# Patient Record
Sex: Female | Born: 1990
Health system: Southern US, Community
[De-identification: ages and names within clinical notes are randomized; demographics above are authoritative.]

## PROBLEM LIST (undated history)

## (undated) DIAGNOSIS — J45909 Unspecified asthma, uncomplicated: Secondary | ICD-10-CM

## (undated) DIAGNOSIS — I10 Essential (primary) hypertension: Secondary | ICD-10-CM

## (undated) DIAGNOSIS — O24419 Gestational diabetes mellitus in pregnancy, unspecified control: Secondary | ICD-10-CM

## (undated) HISTORY — PX: CHOLECYSTECTOMY: SHX55

---

## 2010-01-04 ENCOUNTER — Emergency Department (HOSPITAL_COMMUNITY): Admission: EM | Admit: 2010-01-04 | Discharge: 2010-01-04 | Payer: Self-pay | Admitting: Emergency Medicine

## 2010-02-14 ENCOUNTER — Inpatient Hospital Stay (HOSPITAL_COMMUNITY): Admission: AD | Admit: 2010-02-14 | Discharge: 2010-02-14 | Payer: Self-pay | Admitting: Obstetrics and Gynecology

## 2010-02-14 ENCOUNTER — Ambulatory Visit: Payer: Self-pay | Admitting: Advanced Practice Midwife

## 2010-03-02 ENCOUNTER — Ambulatory Visit: Payer: Self-pay | Admitting: Physician Assistant

## 2010-03-02 ENCOUNTER — Inpatient Hospital Stay (HOSPITAL_COMMUNITY): Admission: AD | Admit: 2010-03-02 | Discharge: 2010-03-02 | Payer: Self-pay | Admitting: Obstetrics & Gynecology

## 2010-03-08 ENCOUNTER — Inpatient Hospital Stay (HOSPITAL_COMMUNITY): Admission: AD | Admit: 2010-03-08 | Discharge: 2010-03-08 | Payer: Self-pay | Admitting: Family Medicine

## 2011-03-08 LAB — COMPREHENSIVE METABOLIC PANEL
ALT: 13 U/L (ref 0–35)
Alkaline Phosphatase: 31 U/L — ABNORMAL LOW (ref 39–117)
CO2: 25 mEq/L (ref 19–32)
Calcium: 8.6 mg/dL (ref 8.4–10.5)
Chloride: 103 mEq/L (ref 96–112)
GFR calc non Af Amer: >60 mL/min (ref >60)
Glucose, Bld: 83 mg/dL (ref 70–99)
Sodium: 136 mEq/L (ref 135–145)
Total Bilirubin: 0.4 mg/dL (ref 0.3–1.2)

## 2011-03-08 LAB — DIFFERENTIAL
Basophils Absolute: 0 10*3/uL (ref 0.0–0.1)
Basophils Relative: 0 % (ref 0–1)
Eosinophils Absolute: 0.1 10*3/uL (ref 0.0–0.7)
Lymphs Abs: 0.8 10*3/uL (ref 0.7–4.0)
Neutrophils Relative %: 72 % (ref 43–77)

## 2011-03-08 LAB — URINALYSIS, ROUTINE W REFLEX MICROSCOPIC
Ketones, ur: 15 mg/dL — AB
Nitrite: NEGATIVE
Protein, ur: NEGATIVE mg/dL
Urobilinogen, UA: 1 mg/dL (ref 0.0–1.0)

## 2011-03-08 LAB — URINE MICROSCOPIC-ADD ON

## 2011-03-08 LAB — CBC
Hemoglobin: 9.6 g/dL — ABNORMAL LOW (ref 12.0–15.0)
MCHC: 34.4 g/dL (ref 30.0–36.0)
RBC: 3.16 MIL/uL — ABNORMAL LOW (ref 3.87–5.11)

## 2011-03-11 LAB — URINE MICROSCOPIC-ADD ON

## 2011-03-11 LAB — URINALYSIS, ROUTINE W REFLEX MICROSCOPIC
Bilirubin Urine: NEGATIVE
Hgb urine dipstick: NEGATIVE
Specific Gravity, Urine: 1.01 (ref 1.005–1.030)
pH: 8 (ref 5.0–8.0)

## 2011-03-11 LAB — WET PREP, GENITAL: Trich, Wet Prep: NONE SEEN

## 2011-03-11 LAB — GC/CHLAMYDIA PROBE AMP, GENITAL
Chlamydia, DNA Probe: POSITIVE — AB
GC Probe Amp, Genital: POSITIVE — AB

## 2011-03-15 LAB — URINE MICROSCOPIC-ADD ON

## 2011-03-15 LAB — URINALYSIS, ROUTINE W REFLEX MICROSCOPIC
Bilirubin Urine: NEGATIVE
Hgb urine dipstick: NEGATIVE
Nitrite: NEGATIVE
Specific Gravity, Urine: 1.01 (ref 1.005–1.030)
Urobilinogen, UA: 0.2 mg/dL (ref 0.0–1.0)
pH: 6.5 (ref 5.0–8.0)

## 2019-10-22 ENCOUNTER — Encounter (HOSPITAL_COMMUNITY): Payer: Self-pay

## 2019-10-22 ENCOUNTER — Inpatient Hospital Stay (HOSPITAL_COMMUNITY)
Admission: AD | Admit: 2019-10-22 | Discharge: 2019-10-22 | Disposition: A | Discharge status: Home / Self Care | Payer: Medicaid Other | Attending: Obstetrics & Gynecology | Admitting: Obstetrics & Gynecology

## 2019-10-22 DIAGNOSIS — O210 Mild hyperemesis gravidarum: Secondary | ICD-10-CM | POA: Insufficient documentation

## 2019-10-22 DIAGNOSIS — Z3A28 28 weeks gestation of pregnancy: Secondary | ICD-10-CM | POA: Insufficient documentation

## 2019-10-22 DIAGNOSIS — O99513 Diseases of the respiratory system complicating pregnancy, third trimester: Secondary | ICD-10-CM | POA: Diagnosis not present

## 2019-10-22 DIAGNOSIS — O99019 Anemia complicating pregnancy, unspecified trimester: Secondary | ICD-10-CM | POA: Insufficient documentation

## 2019-10-22 DIAGNOSIS — O99013 Anemia complicating pregnancy, third trimester: Secondary | ICD-10-CM | POA: Insufficient documentation

## 2019-10-22 DIAGNOSIS — A5901 Trichomonal vulvovaginitis: Secondary | ICD-10-CM | POA: Diagnosis not present

## 2019-10-22 DIAGNOSIS — O219 Vomiting of pregnancy, unspecified: Secondary | ICD-10-CM

## 2019-10-22 DIAGNOSIS — D649 Anemia, unspecified: Secondary | ICD-10-CM | POA: Diagnosis not present

## 2019-10-22 DIAGNOSIS — O23593 Infection of other part of genital tract in pregnancy, third trimester: Secondary | ICD-10-CM

## 2019-10-22 DIAGNOSIS — O98313 Other infections with a predominantly sexual mode of transmission complicating pregnancy, third trimester: Secondary | ICD-10-CM | POA: Insufficient documentation

## 2019-10-22 DIAGNOSIS — Z79899 Other long term (current) drug therapy: Secondary | ICD-10-CM | POA: Insufficient documentation

## 2019-10-22 DIAGNOSIS — J45909 Unspecified asthma, uncomplicated: Secondary | ICD-10-CM | POA: Insufficient documentation

## 2019-10-22 DIAGNOSIS — O23599 Infection of other part of genital tract in pregnancy, unspecified trimester: Secondary | ICD-10-CM | POA: Insufficient documentation

## 2019-10-22 HISTORY — DX: Essential (primary) hypertension: I10

## 2019-10-22 HISTORY — DX: Gestational diabetes mellitus in pregnancy, unspecified control: O24.419

## 2019-10-22 HISTORY — DX: Unspecified asthma, uncomplicated: J45.909

## 2019-10-22 LAB — BASIC METABOLIC PANEL
Anion gap: 10 (ref 5–15)
BUN: <5 mg/dL — ABNORMAL LOW (ref 6–20)
CO2: 23 mmol/L (ref 22–32)
Calcium: 9 mg/dL (ref 8.9–10.3)
Chloride: 99 mmol/L (ref 98–111)
Creatinine, Ser: 0.7 mg/dL (ref 0.44–1.00)
GFR calc Af Amer: >60 mL/min (ref >60)
GFR calc non Af Amer: >60 mL/min (ref >60)
Glucose, Bld: 88 mg/dL (ref 70–99)
Potassium: 3.8 mmol/L (ref 3.5–5.1)
Sodium: 132 mmol/L — ABNORMAL LOW (ref 135–145)

## 2019-10-22 LAB — CBC
HCT: 26 % — ABNORMAL LOW (ref 36.0–46.0)
Hemoglobin: 8.5 g/dL — ABNORMAL LOW (ref 12.0–15.0)
MCH: 27.6 pg (ref 26.0–34.0)
MCHC: 32.7 g/dL (ref 30.0–36.0)
MCV: 84.4 fL (ref 80.0–100.0)
Platelets: 168 10*3/uL (ref 150–400)
RBC: 3.08 MIL/uL — ABNORMAL LOW (ref 3.87–5.11)
RDW: 15.8 % — ABNORMAL HIGH (ref 11.5–15.5)
WBC: 7.8 10*3/uL (ref 4.0–10.5)
nRBC: 0 % (ref 0.0–0.2)

## 2019-10-22 LAB — RAPID URINE DRUG SCREEN, HOSP PERFORMED
Amphetamines: NOT DETECTED
Barbiturates: NOT DETECTED
Benzodiazepines: NOT DETECTED
Cocaine: NOT DETECTED
Opiates: NOT DETECTED
Tetrahydrocannabinol: POSITIVE — AB

## 2019-10-22 LAB — URINALYSIS, ROUTINE W REFLEX MICROSCOPIC
Bilirubin Urine: NEGATIVE
Glucose, UA: NEGATIVE mg/dL
Hgb urine dipstick: NEGATIVE
Ketones, ur: NEGATIVE mg/dL
Leukocytes,Ua: NEGATIVE
Nitrite: NEGATIVE
Protein, ur: NEGATIVE mg/dL
Specific Gravity, Urine: 1.014 (ref 1.005–1.030)
pH: 7 (ref 5.0–8.0)

## 2019-10-22 LAB — WET PREP, GENITAL
Clue Cells Wet Prep HPF POC: NONE SEEN
Sperm: NONE SEEN
Yeast Wet Prep HPF POC: NONE SEEN

## 2019-10-22 MED ORDER — POLYSACCHARIDE IRON COMPLEX 150 MG PO CAPS: 150.0000 mg | ORAL_CAPSULE | Freq: Every day | ORAL | 1 refills | Status: DC

## 2019-10-22 MED ORDER — METRONIDAZOLE 500 MG PO TABS
2000.0000 mg | ORAL_TABLET | Freq: Once | ORAL | Status: AC
  Administered 2019-10-22: 19:00:00 2000 mg via ORAL
  Filled 2019-10-22: qty 4

## 2019-10-22 MED ORDER — ONDANSETRON HCL 4 MG PO TABS
8.0000 mg | ORAL_TABLET | Freq: Three times a day (TID) | ORAL | Status: DC | PRN
  Administered 2019-10-22: 19:00:00 8 mg via ORAL
  Filled 2019-10-22: qty 2

## 2019-10-22 MED ORDER — ONDANSETRON HCL 8 MG PO TABS: 8.0000 mg | ORAL_TABLET | Freq: Three times a day (TID) | ORAL | 0 refills | Status: DC | PRN

## 2019-10-22 MED ORDER — DIPHENHYDRAMINE HCL 25 MG PO TABS: 25.0000 mg | ORAL_TABLET | Freq: Four times a day (QID) | ORAL | 0 refills | Status: DC | PRN

## 2019-10-22 MED ORDER — METOCLOPRAMIDE HCL 10 MG PO TABS: 10.0000 mg | ORAL_TABLET | Freq: Four times a day (QID) | ORAL | 0 refills | Status: DC | PRN

## 2019-10-22 NOTE — Progress Notes (Signed)
CSW at bedside to assess pt for accessibility to community resources such as food and food programs. CSW engaged pt in conversation regarding the reason behind her ED visit. Pt explained that she just moved here from [place] and was struggling with navigating her way around community resources. CSW provided patient with EBT application, and along with 2 sets of information related to food pantries and food programs.   Pt explained also that she would need assistance with transportation home and affording her prescription. CSW currently consulting RNCM to determine if patient is eligible for [facility] letter.   TOC team will continue to follow patient for discharge related needs  [place] [facility]  Clinical Social Worker  Ph: [phone]

## 2019-10-22 NOTE — MAU Note (Signed)
CSW at bedside.

## 2019-10-22 NOTE — MAU Note (Signed)
Followed up with Education officer, museum. She will bring resources for patient for discharge.

## 2019-10-22 NOTE — Discharge Instructions (Signed)
Pregnancy and Anemia ° °Anemia is a condition in which the concentration of red blood cells, or hemoglobin, in the blood is below normal. Hemoglobin is a substance in red blood cells that carries oxygen to the tissues of the body. Anemia results when enough oxygen does not reach these tissues. °Anemia is common during pregnancy because the woman's body needs more blood volume and blood cells to provide nutrition to the fetus. The fetus needs iron and folic acid as it is developing. Your body may not produce enough red blood cells because of this. Also, during pregnancy, the liquid part of the blood (plasma) increases by about 30-50%, and the red blood cells increase by only 20%. This lowers the concentration of the red blood cells and creates a natural anemia-like situation. °What are the causes? °The most common cause of anemia during pregnancy is not having enough iron in the body to make red blood cells (iron deficiency anemia). Other causes may include: °· Folic acid deficiency. °· Vitamin B12 deficiency. °· Certain prescription or over-the-counter medicines. °· Certain medical conditions or infections that destroy red blood cells. °· A low platelet count and bleeding caused by antibodies that go through the placenta to the fetus from the mother’s blood. °What are the signs or symptoms? °Mild anemia may not be noticeable. If it becomes severe, symptoms may include: °· Feeling tired (fatigue). °· Shortness of breath, especially during activity. °· Weakness. °· Fainting. °· Pale looking skin. °· Headaches. °· A fast or irregular heartbeat (palpitations). °· Dizziness. °How is this diagnosed? °This condition may be diagnosed based on: °· Your medical history and a physical exam. °· Blood tests. °How is this treated? °Treatment for anemia during pregnancy depends on the cause of the anemia. Treatment can include: °· Dietary changes. °· Supplements of iron, vitamin B12, or folic acid. °· A blood transfusion. This may  be needed if anemia is severe. °· Hospitalization. This may be needed if there is a lot of blood loss or severe anemia. °Follow these instructions at home: °· Follow recommendations from your dietitian or health care provider about changing your diet. °· Increase your vitamin C intake. This will help the stomach absorb more iron. Some foods that are high in vitamin C include: °? Oranges. °? Peppers. °? Tomatoes. °? Mangoes. °· Eat a diet rich in iron. This would include foods such as: °? Liver. °? Beef. °? Eggs. °? Whole grains. °? Spinach. °? Dried fruit. °· Take iron and vitamins as told by your health care provider. °· Eat green leafy vegetables. These are a good source of folic acid. °· Keep all follow-up visits as told by your health care provider. This is important. °Contact a health care provider if: °· You have frequent or lasting headaches. °· You look pale. °· You bruise easily. °Get help right away if: °· You have extreme weakness, shortness of breath, or chest pain. °· You become dizzy or have trouble concentrating. °· You have heavy vaginal bleeding. °· You develop a rash. °· You have bloody or black, tarry stools. °· You faint. °· You vomit up blood. °· You vomit repeatedly. °· You have abdominal pain. °· You have a fever. °· You are dehydrated. °Summary °· Anemia is a condition in which the concentration of red blood cells or hemoglobin in the blood is below normal. °· Anemia is common during pregnancy because the woman's body needs more blood volume and blood cells to provide nutrition to the fetus. °· The most   common cause of anemia during pregnancy is not having enough iron in the body to make red blood cells (iron deficiency anemia).  Mild anemia may not be noticeable. If it becomes severe, symptoms may include feeling tired and weak. This information is not intended to replace advice given to you by your health care provider. Make sure you discuss any questions you have with your health care  provider. Document Released: 12/03/2000 Document Revised: 03/30/2019 Document Reviewed: 01/11/2017 Elsevier Patient Education  2020 Elsevier Inc.   Morning Sickness  Morning sickness is when you feel sick to your stomach (nauseous) during pregnancy. You may feel sick to your stomach and throw up (vomit). You may feel sick in the morning, but you can feel this way at any time of day. Some women feel very sick to their stomach and cannot stop throwing up (hyperemesis gravidarum). Follow these instructions at home: Medicines  Take over-the-counter and prescription medicines only as told by your doctor. Do not take any medicines until you talk with your doctor about them first.  Taking multivitamins before getting pregnant can stop or lessen the harshness of morning sickness. Eating and drinking  Eat dry toast or crackers before getting out of bed.  Eat 5 or 6 small meals a day.  Eat dry and bland foods like rice and baked potatoes.  Do not eat greasy, fatty, or spicy foods.  Have someone cook for you if the smell of food causes you to feel sick or throw up.  If you feel sick to your stomach after taking prenatal vitamins, take them at night or with a snack.  Eat protein when you need a snack. Nuts, yogurt, and cheese are good choices.  Drink fluids throughout the day.  Try ginger ale made with real ginger, ginger tea made from fresh grated ginger, or ginger candies. General instructions  Do not use any products that have nicotine or tobacco in them, such as cigarettes and e-cigarettes. If you need help quitting, ask your doctor.  Use an air purifier to keep the air in your house free of smells.  Get lots of fresh air.  Try to avoid smells that make you feel sick.  Try: ? Wearing a bracelet that is used for seasickness (acupressure wristband). ? Going to a doctor who puts thin needles into certain body points (acupuncture) to improve how you feel. Contact a doctor  if:  You need medicine to feel better.  You feel dizzy or light-headed.  You are losing weight. Get help right away if:  You feel very sick to your stomach and cannot stop throwing up.  You pass out (faint).  You have very bad pain in your belly. Summary  Morning sickness is when you feel sick to your stomach (nauseous) during pregnancy.  You may feel sick in the morning, but you can feel this way at any time of day.  Making some changes to what you eat may help your symptoms go away. This information is not intended to replace advice given to you by your health care provider. Make sure you discuss any questions you have with your health care provider. Document Released: 01/13/2005 Document Revised: 11/18/2017 Document Reviewed: 01/06/2017 Elsevier Patient Education  2020 Elsevier Inc.   Abdominal Pain During Pregnancy  Belly (abdominal) pain is common during pregnancy. There are many possible causes. Most of the time, it is not a serious problem. Other times, it can be a sign that something is wrong with the pregnancy. Always tell  your doctor if you have belly pain. Follow these instructions at home:  Do not have sex or put anything in your vagina until your pain goes away completely.  Get plenty of rest until your pain gets better.  Drink enough fluid to keep your pee (urine) pale yellow.  Take over-the-counter and prescription medicines only as told by your doctor.  Keep all follow-up visits as told by your doctor. This is important. Contact a doctor if:  Your pain continues or gets worse after resting.  You have lower belly pain that: ? Comes and goes at regular times. ? Spreads to your back. ? Feels like menstrual cramps.  You have pain or burning when you pee (urinate). Get help right away if:  You have a fever or chills.  You have vaginal bleeding.  You are leaking fluid from your vagina.  You are passing tissue from your vagina.  You throw up  (vomit) for more than 24 hours.  You have watery poop (diarrhea) for more than 24 hours.  Your baby is moving less than usual.  You feel very weak or faint.  You have shortness of breath.  You have very bad pain in your upper belly. Summary  Belly (abdominal) pain is common during pregnancy. There are many possible causes.  If you have belly pain during pregnancy, tell your doctor right away.  Keep all follow-up visits as told by your doctor. This is important. This information is not intended to replace advice given to you by your health care provider. Make sure you discuss any questions you have with your health care provider. Document Released: 11/24/2009 Document Revised: 03/26/2019 Document Reviewed: 03/10/2017 Elsevier Patient Education  [place].   Trichomoniasis Trichomoniasis is an STI (sexually transmitted infection) that can affect both women and men. In women, the outer area of the female genitalia (vulva) and the vagina are affected. In men, mainly the penis is affected, but the prostate and other reproductive organs can also be involved.  This condition can be treated with medicine. It often has no symptoms (is asymptomatic), especially in men. If not treated, trichomoniasis can last for months or years. What are the causes? This condition is caused by a parasite called Trichomonas vaginalis. Trichomoniasis most often spreads from person to person (is contagious) through sexual contact. What increases the risk? The following factors may make you more likely to develop this condition:  Having unprotected sex.  Having sex with a partner who has trichomoniasis.  Having multiple sexual partners.  Having had previous trichomoniasis infections or other STIs. What are the signs or symptoms? In women, symptoms of trichomoniasis include:  Abnormal vaginal discharge that is clear, white, gray, or yellow-green and foamy and has an unusual "fishy" odor.  Itching  and irritation of the vagina and vulva.  Burning or pain during urination or sex.  Redness and swelling of the genitals. In men, symptoms of trichomoniasis include:  Penile discharge that may be foamy or contain pus.  Pain in the penis. This may happen only when urinating.  Itching or irritation inside the penis.  Burning after urination or ejaculation. How is this diagnosed? In women, this condition may be found during a routine Pap test or physical exam. It may be found in men during a routine physical exam. Your health care provider may do tests to help diagnose this infection, such as:  Urine tests (men and women).  The following in women: ? Testing the pH of the vagina. ?  A vaginal swab test that checks for the Trichomonas vaginalis parasite. ? Testing vaginal secretions. Your health care provider may test you for other STIs, including HIV (human immunodeficiency virus). How is this treated? This condition is treated with medicine taken by mouth (orally), such as metronidazole or tinidazole, to fight the infection. Your sexual partner(s) also need to be tested and treated.  If you are a woman and you plan to become pregnant or think you may be pregnant, tell your health care provider right away. Some medicines that are used to treat the infection should not be taken during pregnancy. Your health care provider may recommend over-the-counter medicines or creams to help relieve itching or irritation. You may be tested for infection again 3 months after treatment. Follow these instructions at home:  Take and use over-the-counter and prescription medicines, including creams, only as told by your health care provider.  Take your antibiotic medicine as told by your health care provider. Do not stop taking the antibiotic even if you start to feel better.  Do not have sex until 7-10 days after you finish your medicine, or until your health care provider approves. Ask your health care  provider when you may start to have sex again.  (Women) Do not douche or wear tampons while you have the infection.  Discuss your infection with your sexual partner(s). Make sure that your partner gets tested and treated, if necessary.  Keep all follow-up visits as told by your health care provider. This is important. How is this prevented?   Use condoms every time you have sex. Using condoms correctly and consistently can help protect against STIs.  Avoid having multiple sexual partners.  Talk with your sexual partner about any symptoms that either of you may have, as well as any history of STIs.  Get tested for STIs and STDs (sexually transmitted diseases) before you have sex. Ask your partner to do the same.  Do not have sexual contact if you have symptoms of trichomoniasis or another STI. Contact a health care provider if:  You still have symptoms after you finish your medicine.  You develop pain in your abdomen.  You have pain when you urinate.  You have bleeding after sex.  You develop a rash.  You feel nauseous or you vomit.  You plan to become pregnant or think you may be pregnant. Summary  Trichomoniasis is an STI (sexually transmitted infection) that can affect both women and men.  This condition often has no symptoms (is asymptomatic), especially in men.  Without treatment, this condition can last for months or years.  You should not have sex until 7-10 days after you finish your medicine, or until your health care provider approves. Ask your health care provider when you may start to have sex again.  Discuss your infection with your sexual partner(s). Make sure that your partner gets tested and treated, if necessary. This information is not intended to replace advice given to you by your health care provider. Make sure you discuss any questions you have with your health care provider. Document Released: 06/01/2001 Document Revised: 09/19/2018 Document  Reviewed: 09/19/2018 Elsevier Patient Education  2020 [employer].

## 2019-10-22 NOTE — MAU Note (Signed)
CSW to bring taxi voucher for patient.

## 2019-10-22 NOTE — MAU Provider Note (Signed)
History     CSN: [id number]  Arrival date and time: 10/22/19 1543   First Provider Initiated Contact with Patient 10/22/19 1610      Chief Complaint  Patient presents with  . Contractions    preterm  . Morning Sickness   28 y.o. [id number] @28 .2 wks presenting via EMS for vomiting and ctx. Reports onset of both around 7am. Feels regular ctx but not frequent. Denies VB or LOF. Decreased FM before arrival to MAU but feeling good movement now. Reports hx of N/V throughout pregnancy and uses Zofran, Benadryl, and Reglan. Didn't take any meds today. Reports receiving prenatal care in [place] complicated by GDM and gHTN. She is unsure when she had her last appt. Admits to using MJ for her nausea.   OB History    Gravida  7   Para  3   Term  2   Preterm  1   AB  2   Living  3     SAB  1   TAB  1   Ectopic      Multiple      Live Births  3           Past Medical History:  Diagnosis Date  . Asthma    albuteral inhaler  . Gestational diabetes   . Hypertension     Past Surgical History:  Procedure Laterality Date  . CESAREAN SECTION    . CHOLECYSTECTOMY      History reviewed. No pertinent family history.  Social History   Tobacco Use  . Smoking status: Never Smoker  . Smokeless tobacco: Never Used  Substance Use Topics  . Alcohol use: Not on file  . Drug use: Yes    Allergies: No Known Allergies  No medications prior to admission.    Review of Systems  Constitutional: Negative for chills and fever.  Gastrointestinal: Positive for abdominal pain, nausea and vomiting.  Genitourinary: Negative for vaginal bleeding and vaginal discharge.   Physical Exam   Blood pressure 106/60, pulse 80, temperature 98.6 F (37 C), temperature source Oral, resp. rate 17, last menstrual period 04/07/2019.  Physical Exam  Nursing note and vitals reviewed. Constitutional: She is oriented to person, place, and time. She appears well-developed and well-nourished.  No distress.  HENT:  Head: Normocephalic and atraumatic.  Neck: Normal range of motion.  Cardiovascular: Normal rate.  Respiratory: Effort normal. No respiratory distress.  GI: Soft. She exhibits no distension. There is no abdominal tenderness.  gravid  Genitourinary:    Genitourinary Comments: Ext: mod amt thin white discharge at introitus  VE: closed/long   Musculoskeletal: Normal range of motion.  Neurological: She is alert and oriented to person, place, and time.  Skin: Skin is warm and dry.  Psychiatric: She has a normal mood and affect.  EFM: 140 bpm, mod variability, + accels, rare variable decels Toco: none  Results for orders placed or performed during the hospital encounter of 10/22/19 (from the past 24 hour(s))  CBC     Status: Abnormal   Collection Time: 10/22/19  4:50 PM  Result Value Ref Range   WBC 7.8 4.0 - 10.5 K/uL   RBC 3.08 (L) 3.87 - 5.11 MIL/uL   Hemoglobin 8.5 (L) 12.0 - 15.0 g/dL   HCT 26.0 (L) 36.0 - 46.0 %   MCV 84.4 80.0 - 100.0 fL   MCH 27.6 26.0 - 34.0 pg   MCHC 32.7 30.0 - 36.0 g/dL   RDW 15.8 (H) 11.5 - 15.5 %  Platelets 168 150 - 400 K/uL   nRBC 0.0 0.0 - 0.2 %  Basic metabolic panel     Status: Abnormal   Collection Time: 10/22/19  4:50 PM  Result Value Ref Range   Sodium 132 (L) 135 - 145 mmol/L   Potassium 3.8 3.5 - 5.1 mmol/L   Chloride 99 98 - 111 mmol/L   CO2 23 22 - 32 mmol/L   Glucose, Bld 88 70 - 99 mg/dL   BUN <5 (L) 6 - 20 mg/dL   Creatinine, Ser 0.35 0.44 - 1.00 mg/dL   Calcium 9.0 8.9 - 00.9 mg/dL   GFR calc non Af Amer >60 >60 mL/min   GFR calc Af Amer >60 >60 mL/min   Anion gap 10 5 - 15  Wet prep, genital     Status: Abnormal   Collection Time: 10/22/19  5:00 PM  Result Value Ref Range   Yeast Wet Prep HPF POC NONE SEEN NONE SEEN   Trich, Wet Prep PRESENT (A) NONE SEEN   Clue Cells Wet Prep HPF POC NONE SEEN NONE SEEN   WBC, Wet Prep HPF POC MODERATE (A) NONE SEEN   Sperm NONE SEEN   Urinalysis, Routine w reflex  microscopic     Status: None   Collection Time: 10/22/19  5:02 PM  Result Value Ref Range   Color, Urine YELLOW YELLOW   APPearance CLEAR CLEAR   Specific Gravity, Urine 1.014 1.005 - 1.030   pH 7.0 5.0 - 8.0   Glucose, UA NEGATIVE NEGATIVE mg/dL   Hgb urine dipstick NEGATIVE NEGATIVE   Bilirubin Urine NEGATIVE NEGATIVE   Ketones, ur NEGATIVE NEGATIVE mg/dL   Protein, ur NEGATIVE NEGATIVE mg/dL   Nitrite NEGATIVE NEGATIVE   Leukocytes,Ua NEGATIVE NEGATIVE  Urine rapid drug screen (hosp performed)     Status: Abnormal   Collection Time: 10/22/19  5:02 PM  Result Value Ref Range   Opiates NONE DETECTED NONE DETECTED   Cocaine NONE DETECTED NONE DETECTED   Benzodiazepines NONE DETECTED NONE DETECTED   Amphetamines NONE DETECTED NONE DETECTED   Tetrahydrocannabinol POSITIVE (A) NONE DETECTED   Barbiturates NONE DETECTED NONE DETECTED   MAU Course  Procedures Meds ordered this encounter  Medications  . ondansetron (ZOFRAN) tablet 8 mg  . metroNIDAZOLE (FLAGYL) tablet 2,000 mg  . ondansetron (ZOFRAN) 8 MG tablet    Sig: Take 1 tablet (8 mg total) by mouth every 8 (eight) hours as needed for nausea or vomiting.    Dispense:  30 tablet    Refill:  0    Order Specific Question:   Supervising Provider    Answer:   Adam Phenix [[id number]]  . diphenhydrAMINE (BENADRYL) 25 MG tablet    Sig: Take 1 tablet (25 mg total) by mouth every 6 (six) hours as needed.    Dispense:  30 tablet    Refill:  0    Order Specific Question:   Supervising Provider    Answer:   Adam Phenix [[id number]]  . metoCLOPramide (REGLAN) 10 MG tablet    Sig: Take 1 tablet (10 mg total) by mouth every 6 (six) hours as needed for nausea or vomiting.    Dispense:  30 tablet    Refill:  0    Order Specific Question:   Supervising Provider    Answer:   Adam Phenix [[id number]]  . iron polysaccharides (NIFEREX) 150 MG capsule    Sig: Take 1 capsule (150 mg total) by mouth daily.  Dispense:  30 capsule    Refill:  1     Order Specific Question:   Supervising Provider    Answer:   Adam PhenixARNOLD, JAMES G [[id number]]   MDM Labs ordered and reviewed. No evidence of UTI or PTL. Pt reports now that she feels pain is not ctx and only hunger. Reg diet and Zofran ordered. SW consult for ? food insecurity and transportation needs. Pt informed of +trich. She states she had this in the beginning of her pregnancy and was treated, she is unsure if her partner was treated and [place]. Flagyl ordered and given. Tolerated full meal, no emesis. SW in to see pt and will arrange aide with transportation and home meds. Stable for discharge home. Prenatal record requested, ROI signed.   Assessment and Plan   1. [redacted] weeks gestation of pregnancy   2. Nausea/vomiting in pregnancy   3. Trichomonal vaginitis during pregnancy in third trimester   4. Anemia affecting pregnancy in third trimester    Discharge home Follow up at [facility] to start care- message sent PTL precautions Rx Reglan Rx Benadryl Rx Zofran Rx Ferrex  Allergies as of 10/22/2019   No Known Allergies     Medication List    TAKE these medications   diphenhydrAMINE 25 MG tablet Commonly known as: BENADRYL Take 1 tablet (25 mg total) by mouth every 6 (six) hours as needed.   iron polysaccharides 150 MG capsule Commonly known as: NIFEREX Take 1 capsule (150 mg total) by mouth daily.   metoCLOPramide 10 MG tablet Commonly known as: Reglan Take 1 tablet (10 mg total) by mouth every 6 (six) hours as needed for nausea or vomiting.   ondansetron 8 MG tablet Commonly known as: ZOFRAN Take 1 tablet (8 mg total) by mouth every 8 (eight) hours as needed for nausea or vomiting.      Donette LarryMelanie Ngoc Daughtridge, CNM 10/22/2019, 8:01 PM

## 2019-10-22 NOTE — MAU Note (Addendum)
Pt is a G6P Term 2 Preterm 1 SAB2 Living3 at 28 weeks c/o N&V and contractions that started this am, arrived via EMS.  Contractions are 8/10 and every 5-10 minutes.  Pt also reports pink spotting when going to the BR.  Pt also reports headache 7/10.    Pt is from [place] and moved this week, has yet to establish [facility].  Received PNC prior to moving and was being followed for [facility] and GDM, no medication currently.    Pt has history of PTL at 30 weeks with PCS for fetal distress at 30 weeks under general anesthesia.  Infant was 4lb 4oz.  Mother unsure of incision type.      Pt states she desired TOLAC however she states she has not been counseled on risk/benefits of VBAC with previous provider.  Pt received care at the women's clinic at [facility] however she is unsure as to the provider or the practice.

## 2019-10-22 NOTE — Progress Notes (Signed)
Consult request has been received. CSW attempting to follow up at present time  Taquisha Phung M. Tennile Styles LCSWA Transitions of Care  Clinical Social Worker  Ph: [phone] 

## 2019-10-22 NOTE — Progress Notes (Signed)
Pt continues to report contractions.  When asked further about quality and type of pain pt state that they feel like "hunger pains."  Nurse further questioned pt about when she last ate and pt stated "earlier today I had some chicken and rice."  When asked if pt has difficulty getting regular access to food pt states that food security is an issue and does need help gaining access to food.    Social work notified to help pt with resources and meal given per provider order.

## 2019-10-23 LAB — GC/CHLAMYDIA PROBE AMP (~~LOC~~) NOT AT ARMC
Chlamydia: NEGATIVE
Comment: NEGATIVE
Comment: NORMAL
Neisseria Gonorrhea: NEGATIVE

## 2019-10-24 ENCOUNTER — Observation Stay (HOSPITAL_COMMUNITY)
Admission: AD | Admit: 2019-10-24 | Discharge: 2019-10-25 | Disposition: A | Discharge status: Home / Self Care | Payer: Medicaid Other | Attending: Obstetrics & Gynecology | Admitting: Obstetrics & Gynecology

## 2019-10-24 ENCOUNTER — Inpatient Hospital Stay (HOSPITAL_COMMUNITY): Payer: Medicaid Other

## 2019-10-24 ENCOUNTER — Encounter (HOSPITAL_COMMUNITY): Payer: Self-pay | Admitting: *Deleted

## 2019-10-24 ENCOUNTER — Other Ambulatory Visit: Payer: Self-pay

## 2019-10-24 ENCOUNTER — Inpatient Hospital Stay (HOSPITAL_BASED_OUTPATIENT_CLINIC_OR_DEPARTMENT_OTHER): Payer: Medicaid Other

## 2019-10-24 DIAGNOSIS — O368130 Decreased fetal movements, third trimester, not applicable or unspecified: Secondary | ICD-10-CM | POA: Diagnosis not present

## 2019-10-24 DIAGNOSIS — A5901 Trichomonal vulvovaginitis: Secondary | ICD-10-CM

## 2019-10-24 DIAGNOSIS — O0930 Supervision of pregnancy with insufficient antenatal care, unspecified trimester: Secondary | ICD-10-CM

## 2019-10-24 DIAGNOSIS — O26893 Other specified pregnancy related conditions, third trimester: Secondary | ICD-10-CM | POA: Diagnosis not present

## 2019-10-24 DIAGNOSIS — Z20828 Contact with and (suspected) exposure to other viral communicable diseases: Secondary | ICD-10-CM | POA: Diagnosis not present

## 2019-10-24 DIAGNOSIS — R12 Heartburn: Secondary | ICD-10-CM | POA: Insufficient documentation

## 2019-10-24 DIAGNOSIS — O210 Mild hyperemesis gravidarum: Principal | ICD-10-CM | POA: Insufficient documentation

## 2019-10-24 DIAGNOSIS — Z3A28 28 weeks gestation of pregnancy: Secondary | ICD-10-CM

## 2019-10-24 DIAGNOSIS — J45909 Unspecified asthma, uncomplicated: Secondary | ICD-10-CM | POA: Insufficient documentation

## 2019-10-24 DIAGNOSIS — O99513 Diseases of the respiratory system complicating pregnancy, third trimester: Secondary | ICD-10-CM | POA: Diagnosis not present

## 2019-10-24 DIAGNOSIS — O0933 Supervision of pregnancy with insufficient antenatal care, third trimester: Secondary | ICD-10-CM

## 2019-10-24 DIAGNOSIS — O368190 Decreased fetal movements, unspecified trimester, not applicable or unspecified: Secondary | ICD-10-CM

## 2019-10-24 DIAGNOSIS — O09213 Supervision of pregnancy with history of pre-term labor, third trimester: Secondary | ICD-10-CM | POA: Diagnosis not present

## 2019-10-24 DIAGNOSIS — Z79899 Other long term (current) drug therapy: Secondary | ICD-10-CM | POA: Insufficient documentation

## 2019-10-24 DIAGNOSIS — O163 Unspecified maternal hypertension, third trimester: Secondary | ICD-10-CM | POA: Diagnosis not present

## 2019-10-24 DIAGNOSIS — O26899 Other specified pregnancy related conditions, unspecified trimester: Secondary | ICD-10-CM

## 2019-10-24 MED ORDER — LACTATED RINGERS IV BOLUS
1000.0000 mL | Freq: Once | INTRAVENOUS | Status: AC
  Administered 2019-10-24: 20:00:00 1000 mL via INTRAVENOUS

## 2019-10-24 MED ORDER — PROMETHAZINE HCL 25 MG/ML IJ SOLN
25.0000 mg | Freq: Four times a day (QID) | INTRAMUSCULAR | Status: DC | PRN
  Administered 2019-10-24: 19:00:00 25 mg via INTRAVENOUS
  Filled 2019-10-24: qty 1

## 2019-10-24 MED ORDER — DEXTROSE IN LACTATED RINGERS 5 % IV SOLN
INTRAVENOUS | Status: DC
  Administered 2019-10-24: 23:00:00 via INTRAVENOUS

## 2019-10-24 MED ORDER — LACTATED RINGERS IV BOLUS: 1000.0000 mL | Freq: Once | INTRAVENOUS | Status: DC

## 2019-10-24 MED ORDER — DIPHENHYDRAMINE HCL 50 MG/ML IJ SOLN
25.0000 mg | Freq: Once | INTRAMUSCULAR | Status: AC
  Administered 2019-10-24: 25 mg via INTRAVENOUS
  Filled 2019-10-24: qty 1

## 2019-10-24 MED ORDER — LACTATED RINGERS IV SOLN
INTRAVENOUS | Status: DC
  Administered 2019-10-24: 20:00:00 via INTRAVENOUS

## 2019-10-24 MED ORDER — SCOPOLAMINE 1 MG/3DAYS TD PT72SCOPOLAMINE 1 MG/3DAYS
1.0000 | MEDICATED_PATCH | TRANSDERMAL | Status: DC
Start: 2019-10-24 — End: 2019-10-25
  Administered 2019-10-24: 1.5 mg via TRANSDERMAL
  Filled 2019-10-24: qty 1

## 2019-10-24 MED ORDER — DIPHENHYDRAMINE HCL 50 MG/ML IJ SOLN
25.0000 mg | Freq: Once | INTRAMUSCULAR | Status: AC
  Administered 2019-10-24: 19:00:00 25 mg via INTRAVENOUS
  Filled 2019-10-24: qty 1

## 2019-10-24 MED ORDER — SODIUM CHLORIDE 0.9 % IV SOLN
8.0000 mg | INTRAVENOUS | Status: AC
  Administered 2019-10-24: 22:00:00 8 mg via INTRAVENOUS
  Filled 2019-10-24: qty 4

## 2019-10-24 NOTE — MAU Note (Signed)
Pt states nausea entire pregnancy. Unable to keep anything down since 0800 this am. Scope patch in place behind right ear. Pt states placed "it's been more than 3 days"

## 2019-10-24 NOTE — MAU Provider Note (Addendum)
History     CSN: [id number]  Arrival date and time: 10/24/19 1623   First Provider Initiated Contact with Patient 10/24/19 1738      Chief Complaint  Patient presents with  . Nausea  . Emesis  . Abdominal Pain   28 y.o. [id number] @28.4 wks presenting with recurrent N/V. Reports onset this am. She is unable to tolerate anything po. She has tried water and crackers. HEG has been intermittent and ongoing through her pregnancy. She usually uses Benadryl, Phenergan, and Reglan. She has a Scopolamine patch on but doesn't remember when she placed it. She was getting care in [place]. Records have been requested. Reports no FM today, last felt yesterday.  OB History    Gravida  7   Para  3   Term  2   Preterm  1   AB  2   Living  3     SAB  1   TAB  1   Ectopic      Multiple      Live Births  3           Past Medical History:  Diagnosis Date  . Asthma    albuteral inhaler  . Gestational diabetes   . Hypertension     Past Surgical History:  Procedure Laterality Date  . CESAREAN SECTION    . CHOLECYSTECTOMY      No family history on file.  Social History   Tobacco Use  . Smoking status: Never Smoker  . Smokeless tobacco: Never Used  Substance Use Topics  . Alcohol use: Not on file  . Drug use: Yes    Allergies: No Known Allergies  Medications Prior to Admission  Medication Sig Dispense Refill Last Dose  . diphenhydrAMINE (BENADRYL) 25 MG tablet Take 1 tablet (25 mg total) by mouth every 6 (six) hours as needed. 30 tablet 0   . iron polysaccharides (NIFEREX) 150 MG capsule Take 1 capsule (150 mg total) by mouth daily. 30 capsule 1   . metoCLOPramide (REGLAN) 10 MG tablet Take 1 tablet (10 mg total) by mouth every 6 (six) hours as needed for nausea or vomiting. 30 tablet 0   . ondansetron (ZOFRAN) 8 MG tablet Take 1 tablet (8 mg total) by mouth every 8 (eight) hours as needed for nausea or vomiting. 30 tablet 0     Review of Systems   Gastrointestinal: Positive for nausea and vomiting. Negative for abdominal pain.  Genitourinary: Negative for vaginal bleeding.   Physical Exam   Blood pressure 126/85, pulse 76, temperature 98 F (36.7 C), resp. rate 16, last menstrual period 04/07/2019, SpO2 99 %.  Physical Exam  Nursing note and vitals reviewed. Constitutional: She is oriented to person, place, and time. She appears well-developed and well-nourished. She appears distressed (vomiting).  HENT:  Head: Normocephalic and atraumatic.  Neck: Normal range of motion.  Cardiovascular: Normal rate.  Respiratory: Effort normal. No respiratory distress.  GI: Soft. She exhibits no distension. There is no abdominal tenderness.  gravid  Musculoskeletal: Normal range of motion.  Neurological: She is alert and oriented to person, place, and time.  Psychiatric: She has a normal mood and affect.  EFM: 145 bpm, mod variability, + accels, no decels Toco: none  No results found for this or any previous visit (from the past 24 hour(s)).  MAU Course  Procedures  MDM 1915: recent onset of intermittent sudden jerking of arms lasting a few seconds, witnessed by myself and RN. Pt denies hx   of drug allergies or drug reactions. Recently given Benadryl, Phenergan, and Scopolamine. Suspect extrapyramidal reaction from Phenergan. Will order another dose of Benadryl.  2050: no longer having jerking movement but emesis again after meds and still no FM, will order US and Zofran.  Transfer of care given to Williams, CNM Bhambri, Melanie, CNM  10/24/2019 9:53 PM   Assessment and Plan  Dystonic reaction resolved with Benadryl Has been getting IV hydration but states is vomiting frequently  RN states has only vomited twice Has received several antiemetics We gave a single dose of Decadron for hyperemesis We gave a single dose of Pepcid for possible acid reflux  Upon reexamination, patient found to be sleeping soundly  Discussed discharge  home and she states she does not feel it is safe to go home with vomiting I reviewed her good hydration status and normal potassium levels She insists she does not feel safe going home while vomiting Insists on talking with Dr Eure in person Reviewed her concerns with Dr Eure who agrees to admit her for hydration until Noon  Williams, Marie L, CNM   

## 2019-10-25 ENCOUNTER — Encounter (HOSPITAL_COMMUNITY): Payer: Self-pay | Admitting: *Deleted

## 2019-10-25 ENCOUNTER — Other Ambulatory Visit: Payer: Self-pay

## 2019-10-25 DIAGNOSIS — R12 Heartburn: Secondary | ICD-10-CM

## 2019-10-25 DIAGNOSIS — O26899 Other specified pregnancy related conditions, unspecified trimester: Secondary | ICD-10-CM

## 2019-10-25 LAB — URINALYSIS, ROUTINE W REFLEX MICROSCOPIC
Bilirubin Urine: NEGATIVE
Glucose, UA: >=500 mg/dL — AB
Hgb urine dipstick: NEGATIVE
Ketones, ur: 80 mg/dL — AB
Leukocytes,Ua: NEGATIVE
Nitrite: NEGATIVE
Protein, ur: NEGATIVE mg/dL
Specific Gravity, Urine: 1.02 (ref 1.005–1.030)
pH: 7 (ref 5.0–8.0)

## 2019-10-25 LAB — SARS CORONAVIRUS 2 (TAT 6-24 HRS): SARS Coronavirus 2: NEGATIVE

## 2019-10-25 MED ORDER — LACTATED RINGERS IV SOLN
INTRAVENOUS | Status: DC
  Administered 2019-10-25: 02:00:00 via INTRAVENOUS

## 2019-10-25 MED ORDER — ACETAMINOPHEN 325 MG PO TABS: 650.0000 mg | ORAL_TABLET | ORAL | Status: DC | PRN

## 2019-10-25 MED ORDER — ONDANSETRON HCL 4 MG/2ML IJ SOLN: 4.0000 mg | Freq: Four times a day (QID) | INTRAMUSCULAR | Status: DC | PRN

## 2019-10-25 MED ORDER — CALCIUM CARBONATE ANTACID 500 MG PO CHEW: 2.0000 | CHEWABLE_TABLET | ORAL | Status: DC | PRN

## 2019-10-25 MED ORDER — PANTOPRAZOLE SODIUM 40 MG PO TBEC: 40.0000 mg | DELAYED_RELEASE_TABLET | Freq: Every day | ORAL | 1 refills | Status: DC

## 2019-10-25 MED ORDER — DEXAMETHASONE SODIUM PHOSPHATE 10 MG/ML IJ SOLN
10.0000 mg | Freq: Once | INTRAMUSCULAR | Status: AC
  Administered 2019-10-25: 01:00:00 10 mg via INTRAVENOUS
  Filled 2019-10-25: qty 1

## 2019-10-25 MED ORDER — ZOLPIDEM TARTRATE 5 MG PO TABS: 5.0000 mg | ORAL_TABLET | Freq: Every evening | ORAL | Status: DC | PRN

## 2019-10-25 MED ORDER — FAMOTIDINE IN NACL 20-0.9 MG/50ML-% IV SOLN: 20.0000 mg | Freq: Once | INTRAVENOUS | Status: DC

## 2019-10-25 MED ORDER — DOCUSATE SODIUM 100 MG PO CAPS: 100.0000 mg | ORAL_CAPSULE | Freq: Every day | ORAL | Status: DC

## 2019-10-25 MED ORDER — PRENATAL MULTIVITAMIN CH: 1.0000 | ORAL_TABLET | Freq: Every day | ORAL | Status: DC

## 2019-10-25 MED ORDER — FAMOTIDINE IN NACL 20-0.9 MG/50ML-% IV SOLN
20.0000 mg | Freq: Once | INTRAVENOUS | Status: AC
  Administered 2019-10-25: 20 mg via INTRAVENOUS
  Filled 2019-10-25: qty 50

## 2019-10-25 MED FILL — PANTOPRAZOLE SOD DR 40 MG T: 40 | 30 days supply | Qty: 30 | Fill #0

## 2019-10-25 NOTE — Plan of Care (Signed)
  Problem: Education: Goal: Knowledge of General Education information will improve Description: Including pain rating scale, medication(s)/side effects and non-pharmacologic comfort measures Outcome: Completed/Met

## 2019-10-25 NOTE — Clinical SW OB High Risk (Signed)
Clinical Social Work Antenatal   Clinical Social Worker:  Dimple Nanas, LCSW Date/Time:  10/25/2019, 2:03 PM Gestational Age on Admission:  28 y.o. Admitting Diagnosis:  hydration   Expected Delivery Date:  01/01/20  [facility] Environment  Home Address:  [place] [address]   Household Member/Support Name:  Dally Oshel   Relationship:   MOB's mother  Other Support:  None reported   Psychosocial Data  Information Source:  Patient Interview Resources:  MOB was provided with information to apply for [facility] and to have her state benefits transferred to [facility]).   Employment:  Unemployed   Medicaid [facility]):  MOB reported having Medicaid in [place]:     Current Grade:  Homebound Arranged: No  Other Resources:  Medicaid(MOB reports having Librarian, academic in [place]:  MOB has no transportation and recently moved to [place] on 10/20/2019 for [place].    Strengths/Weaknesses/Factors to Consider  Concerns Related to Hospitalization: This is MOB's 2nd hospital visit since moving to [facility].  MOB has required assistance with transportation and medication.    Previous Pregnancies/Feelings Towards Pregnancy?  Concerns related to being/becoming a mother?:  MOB reported this is her 6th pregnancy.  Per MOB, MOB has a 64, 69, and 28 year old that resides with MOB's father Lorretta Harp) in [place].  MOB was unable to provide contact information.  When CSW asked about how MOB communicates with her older children MOB responded ,"Through an Ipad. MOB also reported that she had still birth and a miscarriage ( dates unknown).  Social Support (FOB? Who is/will be helping with baby/other kids?): FOB is residing in [place] and [place] has the support of her mother Alicja Everitt [phone]). Couples Relationship (describe):    Recent Stressful Life Events (life changes in past year?):   Recently moving from [place] to [place].    Prenatal Care/Education/Home Preparations: MOB's mother reported that MOB did not receive any PNC until MOB arrived to [facility]. However MOB reported she was receiving care in [place].  MOB could not inform CSW of [facility] provider or date of MOB's last visit.    Domestic Violence (of any type):  No If Yes to Domestic Violence, Describe/Action Plan:     Substance Use During Pregnancy: Yes(MOB reports a hx of marijuana use; Per MOB, her last use was 1 month ago.) (If Yes, Complete SBIRT)  Complete PHQ-9 (Depresssion Screening) on all Antenatal Patients PHQ-9 Score (If Score => 15 complete TREAT):     Follow-up Recommendations:  CSW recommended that MOB follow Dr's order and continue to [facility]. CSW also recommended that MOB transfer government benefits with [facility].   Patient Advised/Response:      Other:      Clinical Assessment/Plan:   CSW met with MOB in room 107.  When CSW arrived, MOB was resting in bed eating her lunch. CSW explained CSW's role and MOB was receptive to meeting with CSW. CSW assessed for psychosocial stressors and MOB identified transportation barriers and difficulty with paying for medications. CSW shared with MOB the hospital transportation protocol.  MOB  appeared frustrated about riding the bus home and requested a taxi voucher when medically ready for discharge.  CSW shared that MOB does not qualify for a taxi voucher however, CSW will assist MOB with bus transportation; MOB declined.  CSW processed other means of transportation and with MOB's permission, CSW contacted MOB's mother for assistance. MOB's mother communicated being aware  that MOB needs assistance with transportation and agreed to contact CSW if she is able to secure a ride.   CSW also asked about MOB's ability to pay for her medication and MOB reported having Medicaid, "But it is for [facility]."  MOB reported not having a physical Medicaid card but is able to provide  CSW with her Medicaid number.  CSW communicated this information to case management to seek assistance for medication co pay.  CSW encouraged MOB to have MOB's benefits transferred to [facility] if [facility] plans to continue to reside here; MOB agreed.   CSW was informed that MOB will receive needed medications from hospital pharmacy at no cost.  CSW received a telephone call from MOB's mother reporting that she has arranged transportation for [facility] however it will be about an hour.  CSW updated medical team.  CSW will meet with MOB again if she delivers at hospital due to substance exposure.  There are no barriers to MOB's discharge.   Laurey Arrow, MSW, LCSW Clinical Social Work [phone]

## 2019-10-25 NOTE — Progress Notes (Signed)
Discharge instructions given to patient. Educated regarding medication changes and follow up appointments. Patient leaving with Protonix in hand.  Rn encouraged patient to wait for ride arranged by CSW. Patient declined.

## 2019-10-25 NOTE — Discharge Summary (Signed)
Physician Discharge Summary  Patient ID: Julie Coleman MRN: [id number] DOB/AGE: Nov 17, 1991 28 y.o.  Admit date: 10/24/2019 Discharge date: 10/25/2019  Admission Diagnoses:Active Problems:   Hyperemesis affecting pregnancy, antepartum   Heartburn during pregnancy, antepartum [redacted]w[redacted]d  Discharge Diagnoses:  Active Problems:   Hyperemesis affecting pregnancy, antepartum   Heartburn during pregnancy, antepartum   Discharged Condition: good  Hospital Course:  Chief Complaint  Patient presents with  . Nausea  . Emesis  . Abdominal Pain   28 y.o. [id number] @28 .4 wks presenting with recurrent N/V. Reports onset this am. She is unable to tolerate anything po. She has tried water and crackers. HEG has been intermittent and ongoing through her pregnancy. She usually uses Benadryl, Phenergan, and Reglan. She has a Scopolamine patch on but doesn't remember when she placed it. She was getting care in . Records have been requested. Reports no FM today, last felt yesterday.          OB History    Gravida  7   Para  3   Term  2   Preterm  1   AB  2   Living  3     SAB  1   TAB  1   Ectopic      Multiple      Live Births  3               Past Medical History:  Diagnosis Date  . Asthma    albuteral inhaler  . Gestational diabetes   . Hypertension          Past Surgical History:  Procedure Laterality Date  . CESAREAN SECTION    . CHOLECYSTECTOMY      No family history on file.  Social History       Tobacco Use  . Smoking status: Never Smoker  . Smokeless tobacco: Never Used  Substance Use Topics  . Alcohol use: Not on file  . Drug use: Yes    Allergies: No Known Allergies         Medications Prior to Admission  Medication Sig Dispense Refill Last Dose  . diphenhydrAMINE (BENADRYL) 25 MG tablet Take 1 tablet (25 mg total) by mouth every 6 (six) hours as needed. 30 tablet 0   . iron polysaccharides (NIFEREX) 150 MG  capsule Take 1 capsule (150 mg total) by mouth daily. 30 capsule 1   . metoCLOPramide (REGLAN) 10 MG tablet Take 1 tablet (10 mg total) by mouth every 6 (six) hours as needed for nausea or vomiting. 30 tablet 0   . ondansetron (ZOFRAN) 8 MG tablet Take 1 tablet (8 mg total) by mouth every 8 (eight) hours as needed for nausea or vomiting. 30 tablet 0     Review of Systems  Gastrointestinal: Positive for nausea and vomiting. Negative for abdominal pain.  Genitourinary: Negative for vaginal bleeding.   Physical Exam   Blood pressure 126/85, pulse 76, temperature 98 F (36.7 C), resp. rate 16, last menstrual period 04/07/2019, SpO2 99 %.  Physical Exam  Nursing note and vitals reviewed. Constitutional: She is oriented to person, place, and time. She appears well-developed and well-nourished. She appears distressed (vomiting).  HENT:  Head: Normocephalic and atraumatic.  Neck: Normal range of motion.  Cardiovascular: Normal rate.  Respiratory: Effort normal. No respiratory distress.  GI: Soft. She exhibits no distension. There is no abdominal tenderness.  gravid  Musculoskeletal: Normal range of motion.  Neurological: She is alert and oriented to person, place, and  time.  Psychiatric: She has a normal mood and affect.  EFM: 145 bpm, mod variability, + accels, no decels Toco: none  No results found for this or any previous visit (from the past 24 hour(s)).  MAU Course  Procedures  MDM 1915: recent onset of intermittent sudden jerking of arms lasting a few seconds, witnessed by myself and RN. Pt denies hx of drug allergies or drug reactions. Recently given Benadryl, Phenergan, and Scopolamine. Suspect extrapyramidal reaction from Phenergan. Will order another dose of Benadryl.  2050: no longer having jerking movement but emesis again after meds and still no FM, will order US and Zofran.  Transfer of care given to Lorenda PeckWilliams, CNM Bhambri, Melanie, CNM  10/24/2019 9:53 PM    Assessment and Plan  Dystonic reaction resolved with Benadryl Has been getting IV hydration but states is vomiting frequently  RN states has only vomited twice Has received several antiemetics We gave a single dose of Decadron for hyperemesis We gave a single dose of Pepcid for possible acid reflux  Upon reexamination, patient found to be sleeping soundly  Discussed discharge home and she states she does not feel it is safe to go home with vomiting I reviewed her good hydration status and normal potassium levels She insists she does not feel safe going home while vomiting Insists on talking with Dr Despina HiddenEure in person Reviewed her concerns with Dr Despina HiddenEure who agrees to admit her for hydration until Kris HartmannNoon  Williams, Elby ShowersMarie L, CNM     Consults: None  Significant Diagnostic Studies: labs:  CBC    Component Value Date/Time   WBC 7.8 10/22/2019 1650   RBC 3.08 (L) 10/22/2019 1650   HGB 8.5 (L) 10/22/2019 1650   HCT 26.0 (L) 10/22/2019 1650   PLT 168 10/22/2019 1650   MCV 84.4 10/22/2019 1650   MCH 27.6 10/22/2019 1650   MCHC 32.7 10/22/2019 1650   RDW 15.8 (H) 10/22/2019 1650   LYMPHSABS 0.8 01/04/2010 1152   MONOABS 0.6 01/04/2010 1152   EOSABS 0.1 01/04/2010 1152   BASOSABS 0.0 01/04/2010 1152     Treatments: IV hydration and antiemetics, Pepcid IV  Discharge Exam: Blood pressure 114/65, pulse 72, temperature 97.8 F (36.6 C), temperature source Oral, resp. rate 18, height 5\' 7"  (1.702 m), weight 74.8 kg, last menstrual period 04/07/2019, SpO2 96 %. General appearance: alert, cooperative and no distress  Disposition: Discharge disposition: 01-Home or Self Care      She had no emesis after admission and tolerated PO diet. She requests discharge today. Discharge Instructions    Discharge patient   Complete by: As directed    Discharge disposition: 01-Home or Self Care   Discharge patient date: 10/25/2019     Allergies as of 10/25/2019      Reactions    Phenergan [promethazine Hcl]    Tardive dyskinesia       Medication List    TAKE these medications   diphenhydrAMINE 25 MG tablet Commonly known as: BENADRYL Take 1 tablet (25 mg total) by mouth every 6 (six) hours as needed.   iron polysaccharides 150 MG capsule Commonly known as: NIFEREX Take 1 capsule (150 mg total) by mouth daily.   metoCLOPramide 10 MG tablet Commonly known as: Reglan Take 1 tablet (10 mg total) by mouth every 6 (six) hours as needed for nausea or vomiting.   ondansetron 8 MG tablet Commonly known as: ZOFRAN Take 1 tablet (8 mg total) by mouth every 8 (eight) hours as needed for nausea or  vomiting.   pantoprazole 40 MG tablet Commonly known as: Protonix Take 1 tablet (40 mg total) by mouth daily.      Follow-up [facility] Follow up in 1 week(s).   Specialty: Obstetrics and Gynecology Why: NOB transfer Contact information: [address] [place], [place] [id number] [place] [address] [phone]          Signed: Emeterio Reeve 10/25/2019, 10:14 AM

## 2019-10-25 NOTE — Progress Notes (Signed)
Glenard Haring CSW alerted to barriers of discharge including confusion of navigating public transportation and obtaining prescriptions.

## 2019-10-25 NOTE — Discharge Instructions (Signed)

## 2019-10-25 NOTE — MAU Note (Signed)
Covid swab obtained. Pt pulled away one time but allowed RN to finish obtaining swab. No symptoms

## 2019-10-25 NOTE — Progress Notes (Signed)
CSW informed by CSW A. Boyd-Gilyard that MOB had recently moved from [place] and has not yet transferred her Medicaid and is needing assistance with Protonix prescription. CSW able to get prescription sent to [facility]. CSW delivered prescription to bedside and MOB denied any questions for CSW at this time.  Elijio Miles, [place]  [facility] [employer] [phone]

## 2019-10-25 NOTE — H&P (Signed)
History     CSN: [id number]  Arrival date and time: 10/24/19 1623   First Provider Initiated Contact with Patient 10/24/19 1738      Chief Complaint  Patient presents with  . Nausea  . Emesis  . Abdominal Pain   28 y.o. [id number] @28 .4 wks presenting with recurrent N/V. Reports onset this am. She is unable to tolerate anything po. She has tried water and crackers. HEG has been intermittent and ongoing through her pregnancy. She usually uses Benadryl, Phenergan, and Reglan. She has a Scopolamine patch on but doesn't remember when she placed it. She was getting care in . Records have been requested. Reports no FM today, last felt yesterday.  OB History    Gravida  7   Para  3   Term  2   Preterm  1   AB  2   Living  3     SAB  1   TAB  1   Ectopic      Multiple      Live Births  3           Past Medical History:  Diagnosis Date  . Asthma    albuteral inhaler  . Gestational diabetes   . Hypertension     Past Surgical History:  Procedure Laterality Date  . CESAREAN SECTION    . CHOLECYSTECTOMY      No family history on file.  Social History   Tobacco Use  . Smoking status: Never Smoker  . Smokeless tobacco: Never Used  Substance Use Topics  . Alcohol use: Not on file  . Drug use: Yes    Allergies: No Known Allergies  Medications Prior to Admission  Medication Sig Dispense Refill Last Dose  . diphenhydrAMINE (BENADRYL) 25 MG tablet Take 1 tablet (25 mg total) by mouth every 6 (six) hours as needed. 30 tablet 0   . iron polysaccharides (NIFEREX) 150 MG capsule Take 1 capsule (150 mg total) by mouth daily. 30 capsule 1   . metoCLOPramide (REGLAN) 10 MG tablet Take 1 tablet (10 mg total) by mouth every 6 (six) hours as needed for nausea or vomiting. 30 tablet 0   . ondansetron (ZOFRAN) 8 MG tablet Take 1 tablet (8 mg total) by mouth every 8 (eight) hours as needed for nausea or vomiting. 30 tablet 0     Review of Systems   Gastrointestinal: Positive for nausea and vomiting. Negative for abdominal pain.  Genitourinary: Negative for vaginal bleeding.   Physical Exam   Blood pressure 126/85, pulse 76, temperature 98 F (36.7 C), resp. rate 16, last menstrual period 04/07/2019, SpO2 99 %.  Physical Exam  Nursing note and vitals reviewed. Constitutional: She is oriented to person, place, and time. She appears well-developed and well-nourished. She appears distressed (vomiting).  HENT:  Head: Normocephalic and atraumatic.  Neck: Normal range of motion.  Cardiovascular: Normal rate.  Respiratory: Effort normal. No respiratory distress.  GI: Soft. She exhibits no distension. There is no abdominal tenderness.  gravid  Musculoskeletal: Normal range of motion.  Neurological: She is alert and oriented to person, place, and time.  Psychiatric: She has a normal mood and affect.  EFM: 145 bpm, mod variability, + accels, no decels Toco: none  No results found for this or any previous visit (from the past 24 hour(s)).  MAU Course  Procedures  MDM 1915: recent onset of intermittent sudden jerking of arms lasting a few seconds, witnessed by myself and RN. Pt denies hx  of drug allergies or drug reactions. Recently given Benadryl, Phenergan, and Scopolamine. Suspect extrapyramidal reaction from Phenergan. Will order another dose of Benadryl.  2050: no longer having jerking movement but emesis again after meds and still no FM, will order [place] and Zofran.  Transfer of care given to Leslye Peer, [place]  10/24/2019 9:53 PM   Assessment and Plan  Dystonic reaction resolved with Benadryl Has been getting IV hydration but states is vomiting frequently  RN states has only vomited twice Has received several antiemetics We gave a single dose of Decadron for hyperemesis We gave a single dose of Pepcid for possible acid reflux  Upon reexamination, patient found to be sleeping soundly  Discussed discharge  home and she states she does not feel it is safe to go home with vomiting I reviewed her good hydration status and normal potassium levels She insists she does not feel safe going home while vomiting Insists on talking with Dr Elonda Husky in person Reviewed her concerns with Dr Elonda Husky who agrees to admit her for hydration until Rich Fuchs, Wilkie Aye, CNM

## 2019-11-01 ENCOUNTER — Inpatient Hospital Stay (HOSPITAL_COMMUNITY)
Admission: AD | Admit: 2019-11-01 | Discharge: 2019-11-01 | Disposition: A | Discharge status: Home / Self Care | Payer: Medicaid Other | Attending: Obstetrics and Gynecology | Admitting: Obstetrics and Gynecology

## 2019-11-01 ENCOUNTER — Encounter (HOSPITAL_COMMUNITY): Payer: Self-pay

## 2019-11-01 ENCOUNTER — Other Ambulatory Visit: Payer: Self-pay

## 2019-11-01 DIAGNOSIS — Z3A29 29 weeks gestation of pregnancy: Secondary | ICD-10-CM

## 2019-11-01 DIAGNOSIS — O26893 Other specified pregnancy related conditions, third trimester: Secondary | ICD-10-CM | POA: Diagnosis not present

## 2019-11-01 DIAGNOSIS — O219 Vomiting of pregnancy, unspecified: Secondary | ICD-10-CM

## 2019-11-01 DIAGNOSIS — O210 Mild hyperemesis gravidarum: Secondary | ICD-10-CM | POA: Insufficient documentation

## 2019-11-01 DIAGNOSIS — R109 Unspecified abdominal pain: Secondary | ICD-10-CM | POA: Diagnosis not present

## 2019-11-01 DIAGNOSIS — F1290 Cannabis use, unspecified, uncomplicated: Secondary | ICD-10-CM

## 2019-11-01 LAB — RAPID URINE DRUG SCREEN, HOSP PERFORMED
Amphetamines: NOT DETECTED
Barbiturates: NOT DETECTED
Benzodiazepines: NOT DETECTED
Cocaine: NOT DETECTED
Opiates: NOT DETECTED
Tetrahydrocannabinol: POSITIVE — AB

## 2019-11-01 LAB — URINALYSIS, ROUTINE W REFLEX MICROSCOPIC
Bilirubin Urine: NEGATIVE
Glucose, UA: NEGATIVE mg/dL
Hgb urine dipstick: NEGATIVE
Ketones, ur: NEGATIVE mg/dL
Leukocytes,Ua: NEGATIVE
Nitrite: NEGATIVE
Protein, ur: NEGATIVE mg/dL
Specific Gravity, Urine: 1.015 (ref 1.005–1.030)
pH: 7 (ref 5.0–8.0)

## 2019-11-01 MED ORDER — ONDANSETRON 4 MG PO TBDP
4.0000 mg | ORAL_TABLET | Freq: Once | ORAL | Status: AC
  Administered 2019-11-01: 10:00:00 4 mg via ORAL
  Filled 2019-11-01: qty 1

## 2019-11-01 MED ORDER — DIPHENHYDRAMINE HCL 50 MG/ML IJ SOLN
50.0000 mg | Freq: Once | INTRAMUSCULAR | Status: AC
  Administered 2019-11-01: 50 mg via INTRAVENOUS
  Filled 2019-11-01: qty 1

## 2019-11-01 MED ORDER — LACTATED RINGERS IV BOLUS
250.0000 mL | Freq: Once | INTRAVENOUS | Status: AC
  Administered 2019-11-01: 250 mL via INTRAVENOUS

## 2019-11-01 MED ORDER — HALOPERIDOL LACTATE 5 MG/ML IJ SOLN
5.0000 mg | Freq: Four times a day (QID) | INTRAMUSCULAR | Status: DC | PRN
  Filled 2019-11-01: qty 1

## 2019-11-01 NOTE — MAU Note (Signed)
Patient states she is unable to take the bus home because she is sleepy from the Benadryl and doesn't want to wait in the rain.  RN called [facility] and was advised to call S.W.  Ramond Marrow with SW to follow up with patient.

## 2019-11-01 NOTE — Progress Notes (Signed)
CSW received phone call from RN stating patient was requesting to speak with CSW regarding transportation assistance. CSW familiar with patient from previous visit. CSW aware CSW A. Boyd-Gilyard was able to get in contact with patient's mother regarding a ride during patient's last visit. CSW addressed this with patient at bedside and patient stated she did not end up taking that ride and instead took the bus. CSW offered to reach out to patient's mother this time to coordinate a ride for patient but patient declined and stated she would take the bus.   No further needs, at this time. Please reconsult if needs arise.  Elijio Miles, [place]  [facility] and [employer] [phone]

## 2019-11-01 NOTE — MAU Note (Signed)
SW spoke with patient.  Patient agreed to take the bus home.  Patient discharged and walked out of MAU in good condition.

## 2019-11-01 NOTE — MAU Provider Note (Addendum)
History     CSN: [id number]  Arrival date and time: 11/01/19 0867   None     Chief Complaint  Patient presents with  . Contractions  . Emesis   HPI   28 y.o. [id number] @[redacted]w[redacted]d , recently seen here at [facility] on 11/04 for hyperemesis, nausea, and heartburn; she is here today for contractions, emesis and nausea. Patient reports her symptoms did not get better since her last MAU visit and have been getting worse last two days. She has not been able to tolerate any PO for last two days including liquids.She usually uses Benadryl or Reglan, took both at 23:00 last night. She also has a Scopolamine patch today, placed four days ago. Patient reports her contractions started 2 days ago with lower back pain and lower abdominal pain with each contraction. Patient reports LBP and lower abdominal pain with each fetal movement as well.  OB History    Gravida  6   Para  3   Term  2   Preterm  1   AB  2   Living  3     SAB  1   TAB  1   Ectopic      Multiple      Live Births  3           Past Medical History:  Diagnosis Date  . Asthma    albuteral inhaler  . Gestational diabetes   . Hypertension     Past Surgical History:  Procedure Laterality Date  . CESAREAN SECTION    . CHOLECYSTECTOMY      No family history on file.  Social History   Tobacco Use  . Smoking status: Never Smoker  . Smokeless tobacco: Never Used  Substance Use Topics  . Alcohol use: Not Currently  . Drug use: Yes    Allergies:  Allergies  Allergen Reactions  . Phenergan [Promethazine Hcl]     Tardive dyskinesia     Medications Prior to Admission  Medication Sig Dispense Refill Last Dose  . diphenhydrAMINE (BENADRYL) 25 MG tablet Take 1 tablet (25 mg total) by mouth every 6 (six) hours as needed. 30 tablet 0   . iron polysaccharides (NIFEREX) 150 MG capsule Take 1 capsule (150 mg total) by mouth daily. 30 capsule 1   . metoCLOPramide (REGLAN) 10 MG tablet Take 1 tablet (10 mg total) by  mouth every 6 (six) hours as needed for nausea or vomiting. 30 tablet 0   . ondansetron (ZOFRAN) 8 MG tablet Take 1 tablet (8 mg total) by mouth every 8 (eight) hours as needed for nausea or vomiting. 30 tablet 0   . pantoprazole (PROTONIX) 40 MG tablet Take 1 tablet (40 mg total) by mouth daily. 30 tablet 1    Review of Systems  Constitutional: Negative.   Cardiovascular: Negative for chest pain and leg swelling.  Gastrointestinal: Positive for abdominal pain, nausea and vomiting.  Genitourinary: Negative for vaginal bleeding, vaginal discharge and vaginal pain.  Musculoskeletal: Positive for back pain.  Neurological: Negative for dizziness and light-headedness.   Physical Exam   Blood pressure 133/82, pulse 87, temperature 98.3 F (36.8 C), temperature source Oral, resp. rate 18, last menstrual period 04/07/2019.  Physical Exam  Nursing note and vitals reviewed. Constitutional: She is oriented to person, place, and time. She appears well-developed and well-nourished. She appears distressed (vomiting on exam).  Cardiovascular: Normal rate.  Respiratory: Effort normal.  GI: Soft. There is abdominal tenderness.  Neurological: She is alert and  oriented to person, place, and time.  Skin: Skin is warm and dry. No erythema.  Psychiatric: She has a normal mood and affect.  EFM: 135bpm baseline, moderate variability, + accels, - deccel Toco: None  MAU Course  Procedures    Results for orders placed or performed during the hospital encounter of 11/01/19 (from the past 24 hour(s))  Urine rapid drug screen     Status: Abnormal   Collection Time: 11/01/19  9:23 AM  Result Value Ref Range   Opiates NONE DETECTED NONE DETECTED   Cocaine NONE DETECTED NONE DETECTED   Benzodiazepines NONE DETECTED NONE DETECTED   Amphetamines NONE DETECTED NONE DETECTED   Tetrahydrocannabinol POSITIVE (A) NONE DETECTED   Barbiturates NONE DETECTED NONE DETECTED  Urinalysis, Routine w reflex microscopic      Status: Abnormal   Collection Time: 11/01/19  9:28 AM  Result Value Ref Range   Color, Urine YELLOW YELLOW   APPearance HAZY (A) CLEAR   Specific Gravity, Urine 1.015 1.005 - 1.030   pH 7.0 5.0 - 8.0   Glucose, UA NEGATIVE NEGATIVE mg/dL   Hgb urine dipstick NEGATIVE NEGATIVE   Bilirubin Urine NEGATIVE NEGATIVE   Ketones, ur NEGATIVE NEGATIVE mg/dL   Protein, ur NEGATIVE NEGATIVE mg/dL   Nitrite NEGATIVE NEGATIVE   Leukocytes,Ua NEGATIVE NEGATIVE   MDM  Labs ordered and reviewed. No evidence of UTI and negative ketones. UDS positive for tetrahydrocannabinol. Patient has Hx of positive tetrahydrocannabinol during this pregnancy prior. PO Zofran 4mg  given at 10:20 10:50 -  Patient reports feeling 50% better at 10:50 but requests for more treatment for her nausea and vomiting. Patient also somnolent and reports feeling tired. Patient tolerated PO crackers well without vomiting. 11:31 - IV Benadryl 50mg  given with LR bolus.  11:50 - Patient found asleep    Assessment and Plan  1. Hyperemesis and nausea in pregnancy  - PO Zofran 4mg  and IV benadryl 50mg  given in unit 2. Positive UDS for tetrahydrocannabinol - Counseling given 3. Abdominal discomfort - uterine irritability noted on Toco today. Abdominal discomfort possibly from the patient's prolonged hyperemesis state. 4. Reactive fetal tracing  Discharge home Patient has an outpatient OB appointment scheduled for 11/17 with Dr. . Continue current prescribed medications Dietary counseling given   11/01/2019, 12:19 PM   . I confirm that I have verified the information documented in the physician assistant student's note and that I have also personally reperformed the history, physical exam and all medical decision making activities of this service and have verified that all service and findings are accurately documented in this student's note.   --Confirmed with patient that she has prescriptions for  all meds administered today.  --Discussed that Scopolamine patch is past useful life --At bedside prior to discharge to discuss diet modifications to reduce episodes of vomiting, eliminate THC --Patient requests taxi voucher at discharge, LCSW at bedside per patient request  --Stable for discharge  12/17, CNM 11/01/2019 1:10 PM

## 2019-11-01 NOTE — Discharge Instructions (Signed)
Nausea and Vomiting in Pregnancy: How is this treated? This condition is managed by controlling symptoms. This may include:  Following an eating plan. This can help lessen nausea and vomiting.  Taking prescription medicines. An eating plan and medicines are often used together to help control symptoms. If medicines do not help relieve nausea and vomiting, you may need to receive fluids through an IV at the hospital. Follow these instructions at home: Eating and drinking   Avoid the following: ? Drinking fluids with meals. Try not to drink anything during the 30 minutes before and after your meals. ? Drinking more than 1 cup of fluid at a time. ? Eating foods that trigger your symptoms. These may include spicy foods, coffee, high-fat foods, very sweet foods, and acidic foods. ? Skipping meals. Nausea can be more intense on an empty stomach. If you cannot tolerate food, do not force it. Try sucking on ice chips or other frozen items and make up for missed calories later. ? Lying down within 2 hours after eating. ? Being exposed to environmental triggers. These may include food smells, smoky rooms, closed spaces, rooms with strong smells, warm or humid places, overly loud and noisy rooms, and rooms with motion or flickering lights. Try eating meals in a well-ventilated area that is free of strong smells. ? Quick and sudden changes in your movement. ? Taking iron pills and multivitamins that contain iron. If you take prescription iron pills, do not stop taking them unless your health care provider approves. ? Preparing food. The smell of food can spoil your appetite or trigger nausea.  To help relieve your symptoms: ? Listen to your body. Everyone is different and has different preferences. Find what works best for you. ? Eat and drink slowly. ? Eat 5-6 small meals daily instead of 3 large meals. Eating small meals and snacks can help you avoid an empty stomach. ? In the morning, before  getting out of bed, eat a couple of crackers to avoid moving around on an empty stomach. ? Try eating starchy foods as these are usually tolerated well. Examples include cereal, toast, bread, potatoes, pasta, rice, and pretzels. ? Include at least 1 serving of protein with your meals and snacks. Protein options include lean meats, poultry, seafood, beans, nuts, nut butters, eggs, cheese, and yogurt. ? Try eating a protein-rich snack before bed. Examples of a protein-rick snack include cheese and crackers or a peanut butter sandwich made with 1 slice of whole-wheat bread and 1 tsp (5 g) of peanut butter. ? Eat or suck on things that have ginger in them. It may help relieve nausea. Add  tsp ground ginger to hot tea or choose ginger tea. ? Try drinking 100% fruit juice or an electrolyte drink. An electrolyte drink contains sodium, potassium, and chloride. ? Drink fluids that are cold, clear, and carbonated or sour. Examples include lemonade, ginger ale, lemon-lime soda, ice water, and sparkling water. ? Brush your teeth or use a mouth rinse after meals. ? Talk with your health care provider about starting a supplement of vitamin B6. General instructions  Take over-the-counter and prescription medicines only as told by your health care provider.  Follow instructions from your health care provider about eating or drinking restrictions.  Continue to take your prenatal vitamins as told by your health care provider. If you are having trouble taking your prenatal vitamins, talk with your health care provider about different options.  Keep all follow-up and pre-birth (prenatal) visits as told  by your health care provider. This is important. Contact a health care provider if:  You have pain in your abdomen.  You have a severe headache.  You have vision problems.  You are losing weight.  You feel weak or dizzy. Get help right away if:  You cannot drink fluids without vomiting.  You vomit  blood.  You have constant nausea and vomiting.  You are very weak.  You faint.  You have a fever and your symptoms suddenly get worse. Summary  Hyperemesis gravidarum is a severe form of nausea and vomiting that happens during pregnancy.  Making some changes to your eating habits may help relieve nausea and vomiting.  This condition may be managed with medicine.  If medicines do not help relieve nausea and vomiting, you may need to receive fluids through an IV at the hospital. This information is not intended to replace advice given to you by your health care provider. Make sure you discuss any questions you have with your health care provider. Document Released: 12/06/2005 Document Revised: 12/26/2017 Document Reviewed: 08/04/2016 Elsevier Patient Education  2020 [employer].  Marijuana Use During Pregnancy and Breastfeeding  Marijuana is the dried leaves, flowers, and stems of the Cannabis sativa or Cannabis indica plant. The plant's active ingredients (cannabinoids), including a chemical called THC, change the chemistry of the brain. Marijuana smoke also has many of the same chemicals as cigarette smoke that cause breathing problems. Marijuana gets into your blood through your lungs when you smoke it and through your digestive system when you swallow it. Using marijuana in any form may be harmful for you and your baby when you are trying to become pregnant and during pregnancy. This includes marijuana that is prescribed to you by a health care provider (medical marijuana). Once marijuana is in your blood, it can travel through your placenta to your baby. It may also pass through breast milk. How does this affect me? Marijuana affects you both mentally and physically. Using marijuana can make you feel high and relaxed. It can also have negative effects, especially at high doses or with long-term use. These include:  Rapid heartbeat and stress on your heart.  Lung irritation and  breathing problems.  Difficulty thinking and making decisions.  Seeing or believing things that are not true (hallucinations and paranoia).  Mood swings, depression, or anxiety.  Decreased ability to learn and remember.  Difficulty getting pregnant. Marijuana can also affect your pregnancy. Not all the effects are known. However, if you use marijuana during pregnancy, you may:  Be less likely to get regular prenatal care and do the things that you need to do to have a healthy pregnancy.  Be more likely to use other drugs that can harm your pregnancy, like drinking alcohol and smoking cigarettes.  Be at higher risk of having your baby die after 28 weeks of pregnancy (stillbirth).  Be at higher risk of giving birth before 37 weeks of pregnancy (premature birth). How does this affect my baby? If you use marijuana during pregnancy, this may affect your baby's development, birth, and life after birth. Your baby may:  Be born prematurely, which can cause physical and mental problems.  Be born with a low birth weight, which can lead to physical and mental problems.  Have problems with brain development.  Have difficulty growing.  Have attention and behavior problems later in life.  Do poorly at school and have learning problems later in life.  Have problems with vision and coordination.  Be at higher risk for using marijuana by age 39. More research is needed to find out exactly how marijuana affects a baby during breastfeeding. Some studies suggest that the chemicals in marijuana can be passed to a baby through breast milk. To limit possible risks, you should not use marijuana during breastfeeding. Follow these instructions at home:  Let your health care provider know if you use marijuana before trying to get pregnant, during pregnancy, or during breastfeeding.  Do not use marijuana in any form when you are trying to get pregnant, when you are pregnant, or when you are  breastfeeding. If you are having trouble stopping marijuana use, ask your health care provider for help.  Do not smoke. If you need help quitting, ask your health care provider for help.  If you are using medical marijuana, ask your health care provider to switch you to a medicine that is safer to use during pregnancy or breastfeeding.  Keep all your prenatal visits as told by your health care provider. This is important. Where to find more information [employer] on Drug Abuse: www.drugabuse.gov March of Dimes: www.marchofdimes.org/pregnancy Contact a health care provider if:  You use marijuana and want to get pregnant.  You use marijuana during pregnancy or breastfeeding.  You need help stopping marijuana use. Get help right away if:  Your baby is not gaining weight or growing as expected. Summary  Using marijuana in any form may be harmful for you and your baby when you are trying to become pregnant, during pregnancy, and during breastfeeding. This includes marijuana that is prescribed to you (medical marijuana).  Some studies suggest that marijuana may pass through breast milk and can affect your baby's brain development.  Talk to your health care provider if you use marijuana in any form while trying to get pregnant, during pregnancy, or while breastfeeding.  Ask your health care provider for help if you are not able to stop using marijuana. This information is not intended to replace advice given to you by your health care provider. Make sure you discuss any questions you have with your health care provider. Document Released: 08/24/2017 Document Revised: 03/30/2019 Document Reviewed: 08/24/2017 Elsevier Patient Education  2020 [employer].  Third Trimester of Pregnancy  The third trimester is from week 28 through week 40 (months 7 through 9). This trimester is when your unborn baby (fetus) is growing very fast. At the end of the ninth month, the unborn baby is about  20 inches in length. It weighs about 6-10 pounds. Follow these instructions at home: Medicines  Take over-the-counter and prescription medicines only as told by your doctor. Some medicines are safe and some medicines are not safe during pregnancy.  Take a prenatal vitamin that contains at least 600 micrograms (mcg) of folic acid.  If you have trouble pooping (constipation), take medicine that will make your stool soft (stool softener) if your doctor approves. Eating and drinking   Eat regular, healthy meals.  Avoid raw meat and uncooked cheese.  If you get low calcium from the food you eat, talk to your doctor about taking a daily calcium supplement.  Eat four or five small meals rather than three large meals a day.  Avoid foods that are high in fat and sugars, such as fried and sweet foods.  To prevent constipation: ? Eat foods that are high in fiber, like fresh fruits and vegetables, whole grains, and beans. ? Drink enough fluids to keep your pee (urine) clear or pale  yellow. Activity  Exercise only as told by your doctor. Stop exercising if you start to have cramps.  Avoid heavy lifting, wear low heels, and sit up straight.  Do not exercise if it is too hot, too humid, or if you are in a place of great height (high altitude).  You may continue to have sex unless your doctor tells you not to. Relieving pain and discomfort  Wear a good support bra if your breasts are tender.  Take frequent breaks and rest with your legs raised if you have leg cramps or low back pain.  Take warm water baths (sitz baths) to soothe pain or discomfort caused by hemorrhoids. Use hemorrhoid cream if your doctor approves.  If you develop puffy, bulging veins (varicose veins) in your legs: ? Wear support hose or compression stockings as told by your doctor. ? Raise (elevate) your feet for 15 minutes, 3-4 times a day. ? Limit salt in your food. Safety  Wear your seat belt when driving.  Make  a list of emergency phone numbers, including numbers for family, friends, the hospital, and police and fire departments. Preparing for your baby's arrival To prepare for the arrival of your baby:  Take prenatal classes.  Practice driving to the hospital.  Visit the hospital and tour the maternity area.  Talk to your work about taking leave once the baby comes.  Pack your hospital bag.  Prepare the baby's room.  Go to your doctor visits.  Buy a rear-facing car seat. Learn how to install it in your car. General instructions  Do not use hot tubs, steam rooms, or saunas.  Do not use any products that contain nicotine or tobacco, such as cigarettes and e-cigarettes. If you need help quitting, ask your doctor.  Do not drink alcohol.  Do not douche or use tampons or scented sanitary pads.  Do not cross your legs for long periods of time.  Do not travel for long distances unless you must. Only do so if your doctor says it is okay.  Visit your dentist if you have not gone during your pregnancy. Use a soft toothbrush to brush your teeth. Be gentle when you floss.  Avoid cat litter boxes and soil used by cats. These carry germs that can cause birth defects in the baby and can cause a loss of your baby (miscarriage) or stillbirth.  Keep all your prenatal visits as told by your doctor. This is important. Contact a doctor if:  You are not sure if you are in labor or if your water has broken.  You are dizzy.  You have mild cramps or pressure in your lower belly.  You have a nagging pain in your belly area.  You continue to feel sick to your stomach, you throw up, or you have watery poop.  You have bad smelling fluid coming from your vagina.  You have pain when you pee. Get help right away if:  You have a fever.  You are leaking fluid from your vagina.  You are spotting or bleeding from your vagina.  You have severe belly cramps or pain.  You lose or gain weight  quickly.  You have trouble catching your breath and have chest pain.  You notice sudden or extreme puffiness (swelling) of your face, hands, ankles, feet, or legs.  You have not felt the baby move in over an hour.  You have severe headaches that do not go away with medicine.  You have trouble seeing.  You are leaking, or you are having a gush of fluid, from your vagina before you are 37 weeks.  You have regular belly spasms (contractions) before you are 37 weeks. Summary  The third trimester is from week 28 through week 40 (months 7 through 9). This time is when your unborn baby is growing very fast.  Follow your doctor's advice about medicine, food, and activity.  Get ready for the arrival of your baby by taking prenatal classes, getting all the baby items ready, preparing the baby's room, and visiting your doctor to be checked.  Get help right away if you are bleeding from your vagina, or you have chest pain and trouble catching your breath, or if you have not felt your baby move in over an hour. This information is not intended to replace advice given to you by your health care provider. Make sure you discuss any questions you have with your health care provider. Document Released: 03/02/2010 Document Revised: 03/29/2019 Document Reviewed: 01/11/2017 Elsevier Patient Education  2020 [employer].

## 2019-11-01 NOTE — MAU Note (Signed)
Patient presents via EMS with contractions and emesis. States the contractions started at 3am. Patient states she has had problems with vomiting this whole pregnancy but the past 3 days she has been unable to keep anything down. She rates contraction pain a 9/10. Denies vaginal bleeding or leaking of fluid.

## 2019-11-05 ENCOUNTER — Ambulatory Visit (INDEPENDENT_AMBULATORY_CARE_PROVIDER_SITE_OTHER): Payer: Medicaid Other | Admitting: *Deleted

## 2019-11-05 ENCOUNTER — Other Ambulatory Visit: Payer: Self-pay

## 2019-11-05 DIAGNOSIS — Z3490 Encounter for supervision of normal pregnancy, unspecified, unspecified trimester: Secondary | ICD-10-CM

## 2019-11-05 NOTE — Progress Notes (Addendum)
3:26 I called [facility] for her telephone visit and was told by a female ( who said she is Adeja's Mother)she is not there , is on her way to doctor's office. I asked if there was a phone number I could call Vidya and was told no, she does not have a phone and she will have her call us.   Sriram Febles,RN 4:10pm Patient has not arrived for visit. Will have registrar reschedule.  Yasamin Karel,RN 4:25pm discussed with Clinical Lead Carrie,RN and if patient comes tomorrow for New OB visit we will proceed with visit as scheduled due to apparent transportation, etc, issues Sriya Kroeze,RN

## 2019-11-06 ENCOUNTER — Encounter (HOSPITAL_COMMUNITY): Payer: Self-pay | Admitting: *Deleted

## 2019-11-06 ENCOUNTER — Encounter: Payer: Self-pay | Admitting: Obstetrics & Gynecology

## 2019-11-06 ENCOUNTER — Inpatient Hospital Stay (HOSPITAL_COMMUNITY)
Admission: AD | Admit: 2019-11-06 | Discharge: 2019-11-06 | Disposition: A | Discharge status: Home / Self Care | Payer: Medicaid Other | Attending: Obstetrics & Gynecology | Admitting: Obstetrics & Gynecology

## 2019-11-06 ENCOUNTER — Other Ambulatory Visit: Payer: Self-pay

## 2019-11-06 DIAGNOSIS — R12 Heartburn: Secondary | ICD-10-CM | POA: Insufficient documentation

## 2019-11-06 DIAGNOSIS — Z3A30 30 weeks gestation of pregnancy: Secondary | ICD-10-CM

## 2019-11-06 DIAGNOSIS — O26893 Other specified pregnancy related conditions, third trimester: Secondary | ICD-10-CM | POA: Diagnosis not present

## 2019-11-06 DIAGNOSIS — D649 Anemia, unspecified: Secondary | ICD-10-CM | POA: Insufficient documentation

## 2019-11-06 DIAGNOSIS — O99013 Anemia complicating pregnancy, third trimester: Secondary | ICD-10-CM | POA: Insufficient documentation

## 2019-11-06 DIAGNOSIS — O212 Late vomiting of pregnancy: Secondary | ICD-10-CM | POA: Insufficient documentation

## 2019-11-06 DIAGNOSIS — O99613 Diseases of the digestive system complicating pregnancy, third trimester: Secondary | ICD-10-CM | POA: Diagnosis not present

## 2019-11-06 DIAGNOSIS — O210 Mild hyperemesis gravidarum: Secondary | ICD-10-CM | POA: Diagnosis not present

## 2019-11-06 DIAGNOSIS — O26899 Other specified pregnancy related conditions, unspecified trimester: Secondary | ICD-10-CM

## 2019-11-06 LAB — CBC
HCT: 23.3 % — ABNORMAL LOW (ref 36.0–46.0)
Hemoglobin: 7.7 g/dL — ABNORMAL LOW (ref 12.0–15.0)
MCH: 26.9 pg (ref 26.0–34.0)
MCHC: 33 g/dL (ref 30.0–36.0)
MCV: 81.5 fL (ref 80.0–100.0)
Platelets: 179 10*3/uL (ref 150–400)
RBC: 2.86 MIL/uL — ABNORMAL LOW (ref 3.87–5.11)
RDW: 15.7 % — ABNORMAL HIGH (ref 11.5–15.5)
WBC: 7.7 10*3/uL (ref 4.0–10.5)
nRBC: 0 % (ref 0.0–0.2)

## 2019-11-06 LAB — COMPREHENSIVE METABOLIC PANEL
ALT: 20 U/L (ref 0–44)
AST: 28 U/L (ref 15–41)
Albumin: 2.7 g/dL — ABNORMAL LOW (ref 3.5–5.0)
Alkaline Phosphatase: 55 U/L (ref 38–126)
Anion gap: 11 (ref 5–15)
BUN: 5 mg/dL — ABNORMAL LOW (ref 6–20)
CO2: 26 mmol/L (ref 22–32)
Calcium: 8.6 mg/dL — ABNORMAL LOW (ref 8.9–10.3)
Chloride: 97 mmol/L — ABNORMAL LOW (ref 98–111)
Creatinine, Ser: 0.68 mg/dL (ref 0.44–1.00)
GFR calc Af Amer: >60 mL/min (ref >60)
GFR calc non Af Amer: >60 mL/min (ref >60)
Glucose, Bld: 86 mg/dL (ref 70–99)
Potassium: 3.2 mmol/L — ABNORMAL LOW (ref 3.5–5.1)
Sodium: 134 mmol/L — ABNORMAL LOW (ref 135–145)
Total Bilirubin: 0.4 mg/dL (ref 0.3–1.2)
Total Protein: 6.1 g/dL — ABNORMAL LOW (ref 6.5–8.1)

## 2019-11-06 LAB — ABO/RH: ABO/RH(D): O POS

## 2019-11-06 MED ORDER — LACTATED RINGERS IV BOLUS
1000.0000 mL | Freq: Once | INTRAVENOUS | Status: AC
  Administered 2019-11-06: 1000 mL via INTRAVENOUS

## 2019-11-06 MED ORDER — ONDANSETRON HCL 4 MG/2ML IJ SOLN
4.0000 mg | Freq: Once | INTRAMUSCULAR | Status: AC
  Administered 2019-11-06: 4 mg via INTRAVENOUS
  Filled 2019-11-06: qty 2

## 2019-11-06 MED ORDER — SODIUM CHLORIDE 0.9 % IV SOLN
510.0000 mg | Freq: Once | INTRAVENOUS | Status: AC
  Administered 2019-11-06: 510 mg via INTRAVENOUS
  Filled 2019-11-06: qty 17

## 2019-11-06 MED ORDER — FAMOTIDINE IN NACL 20-0.9 MG/50ML-% IV SOLN
20.0000 mg | Freq: Once | INTRAVENOUS | Status: AC
  Administered 2019-11-06: 20 mg via INTRAVENOUS
  Filled 2019-11-06: qty 50

## 2019-11-06 MED ORDER — METOCLOPRAMIDE HCL 5 MG/ML IJ SOLN
10.0000 mg | Freq: Once | INTRAMUSCULAR | Status: AC
  Administered 2019-11-06: 10 mg via INTRAVENOUS
  Filled 2019-11-06: qty 2

## 2019-11-06 MED ORDER — ONDANSETRON 4 MG PO TBDP: 4.0000 mg | ORAL_TABLET | Freq: Three times a day (TID) | ORAL | 0 refills | Status: DC | PRN

## 2019-11-06 MED ORDER — SODIUM CHLORIDE 0.9 % IV SOLN
INTRAVENOUS | Status: DC
  Administered 2019-11-06: 14:00:00 via INTRAVENOUS

## 2019-11-06 MED ORDER — METOCLOPRAMIDE HCL 10 MG PO TABS: 10.0000 mg | ORAL_TABLET | Freq: Three times a day (TID) | ORAL | 0 refills | Status: DC | PRN

## 2019-11-06 MED ORDER — PANTOPRAZOLE SODIUM 40 MG PO TBEC: 40.0000 mg | DELAYED_RELEASE_TABLET | Freq: Every day | ORAL | 0 refills | Status: DC

## 2019-11-06 MED ORDER — GLYCOPYRROLATE 0.2 MG/ML IJ SOLN
0.2000 mg | Freq: Once | INTRAMUSCULAR | Status: AC
  Administered 2019-11-06: 0.2 mg via INTRAVENOUS
  Filled 2019-11-06: qty 1

## 2019-11-06 NOTE — MAU Note (Signed)
IV removed, clean and intact

## 2019-11-06 NOTE — Discharge Instructions (Signed)
Anemia  Anemia is a condition in which you do not have enough red blood cells or hemoglobin. Hemoglobin is a substance in red blood cells that carries oxygen. When you do not have enough red blood cells or hemoglobin (are anemic), your body cannot get enough oxygen and your organs may not work properly. As a result, you may feel very tired or have other problems. What are the causes? Common causes of anemia include:  Excessive bleeding. Anemia can be caused by excessive bleeding inside or outside the body, including bleeding from the intestine or from periods in women.  Poor nutrition.  Long-lasting (chronic) kidney, thyroid, and liver disease.  Bone marrow disorders.  Cancer and treatments for cancer.  HIV (human immunodeficiency virus) and AIDS (acquired immunodeficiency syndrome).  Treatments for HIV and AIDS.  Spleen problems.  Blood disorders.  Infections, medicines, and autoimmune disorders that destroy red blood cells. What are the signs or symptoms? Symptoms of this condition include:  Minor weakness.  Dizziness.  Headache.  Feeling heartbeats that are irregular or faster than normal (palpitations).  Shortness of breath, especially with exercise.  Paleness.  Cold sensitivity.  Indigestion.  Nausea.  Difficulty sleeping.  Difficulty concentrating. Symptoms may occur suddenly or develop slowly. If your anemia is mild, you may not have symptoms. How is this diagnosed? This condition is diagnosed based on:  Blood tests.  Your medical history.  A physical exam.  Bone marrow biopsy. Your health care provider may also check your stool (feces) for blood and may do additional testing to look for the cause of your bleeding. You may also have other tests, including:  Imaging tests, such as a CT scan or MRI.  Endoscopy.  Colonoscopy. How is this treated? Treatment for this condition depends on the cause. If you continue to lose a lot of blood, you may  need to be treated at a hospital. Treatment may include:  Taking supplements of iron, vitamin M08, or folic acid.  Taking a hormone medicine (erythropoietin) that can help to stimulate red blood cell growth.  Having a blood transfusion. This may be needed if you lose a lot of blood.  Making changes to your diet.  Having surgery to remove your spleen. Follow these instructions at home:  Take over-the-counter and prescription medicines only as told by your health care provider.  Take supplements only as told by your health care provider.  Follow any diet instructions that you were given.  Keep all follow-up visits as told by your health care provider. This is important. Contact a health care provider if:  You develop new bleeding anywhere in the body. Get help right away if:  You are very weak.  You are short of breath.  You have pain in your abdomen or chest.  You are dizzy or feel faint.  You have trouble concentrating.  You have bloody or black, tarry stools.  You vomit repeatedly or you vomit up blood. Summary  Anemia is a condition in which you do not have enough red blood cells or enough of a substance in your red blood cells that carries oxygen (hemoglobin).  Symptoms may occur suddenly or develop slowly.  If your anemia is mild, you may not have symptoms.  This condition is diagnosed with blood tests as well as a medical history and physical exam. Other tests may be needed.  Treatment for this condition depends on the cause of the anemia. This information is not intended to replace advice given to you by  your health care provider. Make sure you discuss any questions you have with your health care provider. Document Released: 01/13/2005 Document Revised: 11/18/2017 Document Reviewed: 01/07/2017 Elsevier Patient Education  2020 [employer].  Hyperemesis Gravidarum Hyperemesis gravidarum is a severe form of nausea and vomiting that happens during pregnancy.  Hyperemesis is worse than morning sickness. It may cause you to have nausea or vomiting all day for many days. It may keep you from eating and drinking enough food and liquids, which can lead to dehydration, malnutrition, and weight loss. Hyperemesis usually occurs during the first half (the first 20 weeks) of pregnancy. It often goes away once a woman is in her second half of pregnancy. However, sometimes hyperemesis continues through an entire pregnancy. What are the causes? The cause of this condition is not known. It may be related to changes in chemicals (hormones) in the body during pregnancy, such as the high level of pregnancy hormone (human chorionic gonadotropin) or the increase in the female sex hormone (estrogen). What are the signs or symptoms? Symptoms of this condition include:  Nausea that does not go away.  Vomiting that does not allow you to keep any food down.  Weight loss.  Body fluid loss (dehydration).  Having no desire to eat, or not liking food that you have previously enjoyed. How is this diagnosed? This condition may be diagnosed based on:  A physical exam.  Your medical history.  Your symptoms.  Blood tests.  Urine tests. How is this treated? This condition is managed by controlling symptoms. This may include:  Following an eating plan. This can help lessen nausea and vomiting.  Taking prescription medicines. An eating plan and medicines are often used together to help control symptoms. If medicines do not help relieve nausea and vomiting, you may need to receive fluids through an IV at the hospital. Follow these instructions at home: Eating and drinking   Avoid the following: ? Drinking fluids with meals. Try not to drink anything during the 30 minutes before and after your meals. ? Drinking more than 1 cup of fluid at a time. ? Eating foods that trigger your symptoms. These may include spicy foods, coffee, high-fat foods, very sweet foods, and  acidic foods. ? Skipping meals. Nausea can be more intense on an empty stomach. If you cannot tolerate food, do not force it. Try sucking on ice chips or other frozen items and make up for missed calories later. ? Lying down within 2 hours after eating. ? Being exposed to environmental triggers. These may include food smells, smoky rooms, closed spaces, rooms with strong smells, warm or humid places, overly loud and noisy rooms, and rooms with motion or flickering lights. Try eating meals in a well-ventilated area that is free of strong smells. ? Quick and sudden changes in your movement. ? Taking iron pills and multivitamins that contain iron. If you take prescription iron pills, do not stop taking them unless your health care provider approves. ? Preparing food. The smell of food can spoil your appetite or trigger nausea.  To help relieve your symptoms: ? Listen to your body. Everyone is different and has different preferences. Find what works best for you. ? Eat and drink slowly. ? Eat 5-6 small meals daily instead of 3 large meals. Eating small meals and snacks can help you avoid an empty stomach. ? In the morning, before getting out of bed, eat a couple of crackers to avoid moving around on an empty stomach. ? Try  eating starchy foods as these are usually tolerated well. Examples include cereal, toast, bread, potatoes, pasta, rice, and pretzels. ? Include at least 1 serving of protein with your meals and snacks. Protein options include lean meats, poultry, seafood, beans, nuts, nut butters, eggs, cheese, and yogurt. ? Try eating a protein-rich snack before bed. Examples of a protein-rick snack include cheese and crackers or a peanut butter sandwich made with 1 slice of whole-wheat bread and 1 tsp (5 g) of peanut butter. ? Eat or suck on things that have ginger in them. It may help relieve nausea. Add  tsp ground ginger to hot tea or choose ginger tea. ? Try drinking 100% fruit juice or an  electrolyte drink. An electrolyte drink contains sodium, potassium, and chloride. ? Drink fluids that are cold, clear, and carbonated or sour. Examples include lemonade, ginger ale, lemon-lime soda, ice water, and sparkling water. ? Brush your teeth or use a mouth rinse after meals. ? Talk with your health care provider about starting a supplement of vitamin B6. General instructions  Take over-the-counter and prescription medicines only as told by your health care provider.  Follow instructions from your health care provider about eating or drinking restrictions.  Continue to take your prenatal vitamins as told by your health care provider. If you are having trouble taking your prenatal vitamins, talk with your health care provider about different options.  Keep all follow-up and pre-birth (prenatal) visits as told by your health care provider. This is important. Contact a health care provider if:  You have pain in your abdomen.  You have a severe headache.  You have vision problems.  You are losing weight.  You feel weak or dizzy. Get help right away if:  You cannot drink fluids without vomiting.  You vomit blood.  You have constant nausea and vomiting.  You are very weak.  You faint.  You have a fever and your symptoms suddenly get worse. Summary  Hyperemesis gravidarum is a severe form of nausea and vomiting that happens during pregnancy.  Making some changes to your eating habits may help relieve nausea and vomiting.  This condition may be managed with medicine.  If medicines do not help relieve nausea and vomiting, you may need to receive fluids through an IV at the hospital. This information is not intended to replace advice given to you by your health care provider. Make sure you discuss any questions you have with your health care provider. Document Released: 12/06/2005 Document Revised: 12/26/2017 Document Reviewed: 08/04/2016 Elsevier Patient Education  2020  [employer].

## 2019-11-06 NOTE — MAU Note (Signed)
Presents with c/o nausea and vomiting, reports unable to keep anything down including water.  States been vomiting for the past 3 days.  Denies other c/o, no VB or LOF.  Endorses +FM.

## 2019-11-06 NOTE — MAU Provider Note (Signed)
Chief Complaint:  Nausea and Emesis   First Provider Initiated Contact with Patient 11/06/19 0941     HPI: Julie Coleman is a 28 y.o. [id number] at 4162w3d who presents to maternity admissions via EMS reporting nausea & vomiting. This is her 3rd MAU visit this month with same complaint. [place] Has not started care here ([facility] Currently taking zofran, reglan, & protonix; last took her meds around 3 am this morning. States she vomits all day long & can't keep down food or water. Also increased spitting. Reports epigastric pain after vomiting. Symptoms worsened 3 days ago.  Denies contractions, leakage of fluid or vaginal bleeding. Good fetal movement.  Location: abdomen Quality: burning, cramping Severity: 8/10 in pain scale Duration: 3 days Timing: constant Modifying factors: worse after vomiting Associated signs and symptoms: n/v  Pregnancy Course: limited prenatal care  Past Medical History:  Diagnosis Date  . Asthma    albuteral inhaler  . Gestational diabetes   . Hypertension    OB History  Gravida Para Term Preterm AB Living  6 3 2 1 2 3   SAB TAB Ectopic Multiple Live Births  1 1     3     # Outcome Date GA Lbr Len/2nd Weight Sex Delivery Anes PTL Lv  6 Current           5 TAB 05/20/18          4 Preterm 02/16/16 [id number]    CS-Unspec     3 Term 05/22/12 6266w0d    Vag-Spont     2 SAB 11/07/10          1 Term 05/29/10 2166w0d    Vag-Spont      Past Surgical History:  Procedure Laterality Date  . CESAREAN SECTION    . CHOLECYSTECTOMY     History reviewed. No pertinent family history. Social History   Tobacco Use  . Smoking status: Never Smoker  . Smokeless tobacco: Never Used  Substance Use Topics  . Alcohol use: Not Currently  . Drug use: Yes   Allergies  Allergen Reactions  . Phenergan [Promethazine Hcl]     Tardive dyskinesia    Medications Prior to Admission  Medication Sig Dispense Refill Last  Dose  . metoCLOPramide (REGLAN) 10 MG tablet Take 1 tablet (10 mg total) by mouth every 6 (six) hours as needed for nausea or vomiting. 30 tablet 0 11/06/2019 at 0300  . ondansetron (ZOFRAN) 8 MG tablet Take 1 tablet (8 mg total) by mouth every 8 (eight) hours as needed for nausea or vomiting. 30 tablet 0 11/06/2019 at 0300  . pantoprazole (PROTONIX) 40 MG tablet Take 1 tablet (40 mg total) by mouth daily. 30 tablet 1 11/06/2019 at 0300  . diphenhydrAMINE (BENADRYL) 25 MG tablet Take 1 tablet (25 mg total) by mouth every 6 (six) hours as needed. 30 tablet 0   . iron polysaccharides (NIFEREX) 150 MG capsule Take 1 capsule (150 mg total) by mouth daily. 30 capsule 1     I have reviewed patient's Past Medical Hx, Surgical Hx, Family Hx, Social Hx, medications and allergies.   ROS:  Review of Systems  Constitutional: Negative.   Gastrointestinal: Positive for abdominal pain, nausea and vomiting. Negative for constipation and diarrhea.  Genitourinary: Negative.   Neurological: Positive for headaches.    Physical Exam   Patient Vitals for the past 24 hrs:  BP Temp Temp src Pulse Resp SpO2 Height  Weight  11/06/19 1400 125/68 98.6 F (37 C) Oral 78 18 99 % - -  11/06/19 0953 - - - - - - - 76.2 kg  11/06/19 0920 123/74 98.4 F (36.9 C) Oral 86 20 100 % 5\' 7"  (1.702 m) -    Constitutional: Well-developed, well-nourished female in no acute distress.  Cardiovascular: normal rate & rhythm, no murmur Respiratory: normal effort, lung sounds clear throughout GI: Abd soft, non-tender, gravid appropriate for gestational age. Pos BS x 4 MS: Extremities nontender, no edema, normal ROM Neurologic: Alert and oriented x 4.   NST:  Baseline: 135 bpm, Variability: Good {> 6 bpm), Accelerations: Non-reactive but appropriate for gestational age and Decelerations: Absent   Labs: Results for orders placed or performed during the hospital encounter of 11/06/19 (from the past 24 hour(s))  CBC     Status:  Abnormal   Collection Time: 11/06/19 10:53 AM  Result Value Ref Range   WBC 7.7 4.0 - 10.5 K/uL   RBC 2.86 (L) 3.87 - 5.11 MIL/uL   Hemoglobin 7.7 (L) 12.0 - 15.0 g/dL   HCT 11/08/19 (L) 08.8 - 11.0 %   MCV 81.5 80.0 - 100.0 fL   MCH 26.9 26.0 - 34.0 pg   MCHC 33.0 30.0 - 36.0 g/dL   RDW 31.5 (H) 94.5 - 85.9 %   Platelets 179 150 - 400 K/uL   nRBC 0.0 0.0 - 0.2 %  ABO/Rh     Status: None   Collection Time: 11/06/19 10:53 AM  Result Value Ref Range   ABO/RH(D)      O POS Performed at [facility]: [facility], [address], [place], [place]   Comprehensive metabolic panel     Status: Abnormal   Collection Time: 11/06/19 10:53 AM  Result Value Ref Range   Sodium 134 (L) 135 - 145 mmol/L   Potassium 3.2 (L) 3.5 - 5.1 mmol/L   Chloride 97 (L) 98 - 111 mmol/L   CO2 26 22 - 32 mmol/L   Glucose, Bld 86 70 - 99 mg/dL   BUN 5 (L) 6 - 20 mg/dL   Creatinine, Ser 11/08/19 0.44 - 1.00 mg/dL   Calcium 8.6 (L) 8.9 - 10.3 mg/dL   Total Protein 6.1 (L) 6.5 - 8.1 g/dL   Albumin 2.7 (L) 3.5 - 5.0 g/dL   AST 28 15 - 41 U/L   ALT 20 0 - 44 U/L   Alkaline Phosphatase 55 38 - 126 U/L   Total Bilirubin 0.4 0.3 - 1.2 mg/dL   GFR calc non Af Amer >60 >60 mL/min   GFR calc Af Amer >60 >60 mL/min   Anion gap 11 5 - 15    Imaging:  No results found.  MAU Course: Orders Placed This Encounter  Procedures  . Culture, OB Urine  . Urinalysis, Routine w reflex microscopic  . CBC  . RPR  . HIV antibody  . Rubella screen  . Hepatitis B surface antigen  . Comprehensive metabolic panel  . Daily weights  . ABO/Rh  . Discharge patient   Meds ordered this encounter  Medications  . FOLLOWED BY Linked Order Group   . lactated ringers bolus 1,000 mL   . lactated ringers bolus 1,000 mL  . famotidine (PEPCID) IVPB 20 mg premix  . ondansetron (ZOFRAN) injection 4 mg  . glycopyrrolate (ROBINUL) injection 0.2 mg  . metoCLOPramide (REGLAN) injection 10 mg  . ferumoxytol (FERAHEME) 510 mg in sodium  chloride 0.9 % 100  mL IVPB  . 0.9 %  sodium chloride infusion  . metoCLOPramide (REGLAN) 10 MG tablet    Sig: Take 1 tablet (10 mg total) by mouth every 8 (eight) hours as needed for nausea.    Dispense:  30 tablet    Refill:  0    Order Specific Question:   Supervising Provider    Answer:   Verita Schneiders A [[id number]]  . pantoprazole (PROTONIX) 40 MG tablet    Sig: Take 1 tablet (40 mg total) by mouth daily.    Dispense:  30 tablet    Refill:  0    Order Specific Question:   Supervising Provider    Answer:   Verita Schneiders A [[id number]]  . ondansetron (ZOFRAN ODT) 4 MG disintegrating tablet    Sig: Take 1 tablet (4 mg total) by mouth every 8 (eight) hours as needed for nausea or vomiting.    Dispense:  15 tablet    Refill:  0    Order Specific Question:   Supervising Provider    Answer:   Verita Schneiders A [[id number]]    MDM: Category 1 tracing. No contractions.   Pt observed spitting, not vomiting. IV access obtained by EMS. Will give IV fluids, zofran, pepcid, & robinul. Patient also given reglan due to continued complaint of nausea. After all meds, pt not observed vomiting & able to keep down juice & crackers. Reports improvement in symptoms & requesting refills for her at home meds.   Pt was supposed to have new ob appt at [facility] yesterday but did not keep appointment. There is some question as to whether she actually had prenatal care in [place] (see CSW note from 10/25/2019). Prenatal labs ordered today.   Hemoglobin down to 7.7 today. Pt previously prescribed iron supplement but states she can't tolerate it. Given IV feraheme in MAU after consult with Dr. Harolyn Rutherford.   Pt did not give urine sample in MAU.   Assessment: 1. Hyperemesis affecting pregnancy, antepartum   2. Anemia affecting pregnancy in third trimester   3. Heartburn during pregnancy, antepartum   4. [redacted] weeks gestation of pregnancy     Plan: Discharge home in stable condition.  Discussed reasons to return to   MAU Refilled prescriptions Msg to [facility] to reschedule missed appointment & get scheduled for 2nd feraheme infusion   Allergies as of 11/06/2019      Reactions   Phenergan [promethazine Hcl]    Tardive dyskinesia       Medication List    STOP taking these medications   ondansetron 8 MG tablet Commonly known as: ZOFRAN     TAKE these medications   diphenhydrAMINE 25 MG tablet Commonly known as: BENADRYL Take 1 tablet (25 mg total) by mouth every 6 (six) hours as needed.   iron polysaccharides 150 MG capsule Commonly known as: NIFEREX Take 1 capsule (150 mg total) by mouth daily.   metoCLOPramide 10 MG tablet Commonly known as: REGLAN Take 1 tablet (10 mg total) by mouth every 8 (eight) hours as needed for nausea. What changed:   when to take this  reasons to take this   ondansetron 4 MG disintegrating tablet Commonly known as: Zofran ODT Take 1 tablet (4 mg total) by mouth every 8 (eight) hours as needed for nausea or vomiting.   pantoprazole 40 MG tablet Commonly known as: Protonix Take 1 tablet (40 mg total) by mouth daily.       Jorje Guild, NP 11/06/2019 3:12 PM

## 2019-11-06 NOTE — MAU Note (Signed)
Pt. Voided in toilet. urine sample was not collected

## 2019-11-06 NOTE — Progress Notes (Signed)
Pt requesting po fluids, has already kept down 2 cups of apple juice and has eaten a couple of saltine crackers. crackers.

## 2019-11-07 ENCOUNTER — Telehealth: Payer: Self-pay

## 2019-11-07 ENCOUNTER — Other Ambulatory Visit: Payer: Self-pay

## 2019-11-07 LAB — HIV ANTIBODY (ROUTINE TESTING W REFLEX): HIV Screen 4th Generation wRfx: NONREACTIVE — AB

## 2019-11-07 LAB — RUBELLA SCREEN: Rubella: 0.92 index — ABNORMAL LOW (ref >0.99)

## 2019-11-07 LAB — RPR: RPR Ser Ql: NONREACTIVE

## 2019-11-07 LAB — HEPATITIS B SURFACE ANTIGEN: Hepatitis B Surface Ag: NEGATIVE — AB

## 2019-11-07 NOTE — Telephone Encounter (Signed)
Encounter opened in error

## 2019-11-07 NOTE — Telephone Encounter (Addendum)
-----   Message from Jorje Guild, NP sent at 11/06/2019  3:11 PM EST ----- Admin pool - pt needs new ob appt rescheduled (had transportation issues).   Clinical pool - pt needs to be set up for 2nd feraheme infusion next week  Thanks!   Called Short Stay to set up feraheme infusion appt. Appt made for 11/13/19 @ 1100. Called pt to notify her of appt. Instructed pt to arrive at 1045; instructions given for check-in process at [facility]. Pt verbalizes understanding. Feraheme IVPB ordered for admission per Jorje Guild, NP.

## 2019-11-08 ENCOUNTER — Encounter (HOSPITAL_COMMUNITY): Payer: Self-pay

## 2019-11-08 ENCOUNTER — Observation Stay (HOSPITAL_COMMUNITY)
Admission: AD | Admit: 2019-11-08 | Discharge: 2019-11-09 | Disposition: A | Discharge status: Home / Self Care | Payer: Medicaid Other | Attending: Obstetrics & Gynecology | Admitting: Obstetrics & Gynecology

## 2019-11-08 ENCOUNTER — Other Ambulatory Visit: Payer: Self-pay

## 2019-11-08 DIAGNOSIS — Z3A30 30 weeks gestation of pregnancy: Secondary | ICD-10-CM | POA: Diagnosis not present

## 2019-11-08 DIAGNOSIS — O99891 Other specified diseases and conditions complicating pregnancy: Principal | ICD-10-CM | POA: Insufficient documentation

## 2019-11-08 DIAGNOSIS — O99013 Anemia complicating pregnancy, third trimester: Secondary | ICD-10-CM | POA: Insufficient documentation

## 2019-11-08 DIAGNOSIS — F12188 Cannabis abuse with other cannabis-induced disorder: Secondary | ICD-10-CM | POA: Diagnosis present

## 2019-11-08 DIAGNOSIS — R112 Nausea with vomiting, unspecified: Secondary | ICD-10-CM | POA: Insufficient documentation

## 2019-11-08 DIAGNOSIS — O219 Vomiting of pregnancy, unspecified: Secondary | ICD-10-CM

## 2019-11-08 DIAGNOSIS — Z20828 Contact with and (suspected) exposure to other viral communicable diseases: Secondary | ICD-10-CM | POA: Diagnosis not present

## 2019-11-08 DIAGNOSIS — O26899 Other specified pregnancy related conditions, unspecified trimester: Secondary | ICD-10-CM | POA: Diagnosis present

## 2019-11-08 DIAGNOSIS — O99019 Anemia complicating pregnancy, unspecified trimester: Secondary | ICD-10-CM | POA: Diagnosis present

## 2019-11-08 DIAGNOSIS — R12 Heartburn: Secondary | ICD-10-CM | POA: Insufficient documentation

## 2019-11-08 LAB — URINALYSIS, ROUTINE W REFLEX MICROSCOPIC
Bilirubin Urine: NEGATIVE
Glucose, UA: 50 mg/dL — AB
Hgb urine dipstick: NEGATIVE
Ketones, ur: 80 mg/dL — AB
Leukocytes,Ua: NEGATIVE
Nitrite: NEGATIVE
Protein, ur: 30 mg/dL — AB
Specific Gravity, Urine: 1.017 (ref 1.005–1.030)
pH: 7 (ref 5.0–8.0)

## 2019-11-08 LAB — CBC WITH DIFFERENTIAL/PLATELET
Abs Immature Granulocytes: 0.11 10*3/uL — ABNORMAL HIGH (ref 0.00–0.07)
Basophils Absolute: 0 10*3/uL (ref 0.0–0.1)
Basophils Relative: 0 %
Eosinophils Absolute: 0 10*3/uL (ref 0.0–0.5)
Eosinophils Relative: 0 %
HCT: 27.9 % — ABNORMAL LOW (ref 36.0–46.0)
Hemoglobin: 9.4 g/dL — ABNORMAL LOW (ref 12.0–15.0)
Immature Granulocytes: 1 %
Lymphocytes Relative: 9 %
Lymphs Abs: 1.1 10*3/uL (ref 0.7–4.0)
MCH: 27.6 pg (ref 26.0–34.0)
MCHC: 33.7 g/dL (ref 30.0–36.0)
MCV: 81.8 fL (ref 80.0–100.0)
Monocytes Absolute: 0.5 10*3/uL (ref 0.1–1.0)
Monocytes Relative: 4 %
Neutro Abs: 9.6 10*3/uL — ABNORMAL HIGH (ref 1.7–7.7)
Neutrophils Relative %: 86 %
Platelets: 209 10*3/uL (ref 150–400)
RBC: 3.41 MIL/uL — ABNORMAL LOW (ref 3.87–5.11)
RDW: 15.9 % — ABNORMAL HIGH (ref 11.5–15.5)
WBC: 11.3 10*3/uL — ABNORMAL HIGH (ref 4.0–10.5)
nRBC: 0.4 % — ABNORMAL HIGH (ref 0.0–0.2)

## 2019-11-08 LAB — COMPREHENSIVE METABOLIC PANEL
ALT: 27 U/L (ref 0–44)
AST: 37 U/L (ref 15–41)
Albumin: 3.5 g/dL (ref 3.5–5.0)
Alkaline Phosphatase: 71 U/L (ref 38–126)
Anion gap: 17 — ABNORMAL HIGH (ref 5–15)
BUN: <5 mg/dL — ABNORMAL LOW (ref 6–20)
CO2: 25 mmol/L (ref 22–32)
Calcium: 9.4 mg/dL (ref 8.9–10.3)
Chloride: 97 mmol/L — ABNORMAL LOW (ref 98–111)
Creatinine, Ser: 0.79 mg/dL (ref 0.44–1.00)
GFR calc Af Amer: >60 mL/min (ref >60)
GFR calc non Af Amer: >60 mL/min (ref >60)
Glucose, Bld: 101 mg/dL — ABNORMAL HIGH (ref 70–99)
Potassium: 3.3 mmol/L — ABNORMAL LOW (ref 3.5–5.1)
Sodium: 139 mmol/L (ref 135–145)
Total Bilirubin: 0.7 mg/dL (ref 0.3–1.2)
Total Protein: 7.5 g/dL (ref 6.5–8.1)

## 2019-11-08 LAB — TYPE AND SCREEN
ABO/RH(D): O POS
Antibody Screen: NEGATIVE

## 2019-11-08 MED ORDER — GLYCOPYRROLATE 0.2 MG/ML IJ SOLN
0.2000 mg | Freq: Three times a day (TID) | INTRAMUSCULAR | Status: DC
  Administered 2019-11-08 – 2019-11-09 (×3): 0.2 mg via INTRAVENOUS
  Filled 2019-11-08 (×3): qty 1

## 2019-11-08 MED ORDER — ZOLPIDEM TARTRATE 5 MG PO TABS
5.0000 mg | ORAL_TABLET | Freq: Every evening | ORAL | Status: DC | PRN
  Administered 2019-11-08: 5 mg via ORAL
  Filled 2019-11-08: qty 1

## 2019-11-08 MED ORDER — LACTATED RINGERS IV BOLUS
1000.0000 mL | Freq: Once | INTRAVENOUS | Status: AC
  Administered 2019-11-08: 1000 mL via INTRAVENOUS

## 2019-11-08 MED ORDER — CALCIUM CARBONATE ANTACID 500 MG PO CHEW
2.0000 | CHEWABLE_TABLET | ORAL | Status: DC | PRN
  Administered 2019-11-08: 400 mg via ORAL
  Filled 2019-11-08: qty 2

## 2019-11-08 MED ORDER — DEXTROSE 5 % IN LACTATED RINGERS IV BOLUS
1000.0000 mL | Freq: Once | INTRAVENOUS | Status: AC
  Administered 2019-11-08: 1000 mL via INTRAVENOUS

## 2019-11-08 MED ORDER — ACETAMINOPHEN 325 MG PO TABS
650.0000 mg | ORAL_TABLET | ORAL | Status: DC | PRN
  Administered 2019-11-09 (×2): 650 mg via ORAL
  Filled 2019-11-08 (×2): qty 2

## 2019-11-08 MED ORDER — FAMOTIDINE IN NACL 20-0.9 MG/50ML-% IV SOLN
20.0000 mg | Freq: Once | INTRAVENOUS | Status: AC
  Administered 2019-11-08: 20 mg via INTRAVENOUS
  Filled 2019-11-08: qty 50

## 2019-11-08 MED ORDER — SCOPOLAMINE 1 MG/3DAYS TD PT72
1.0000 | MEDICATED_PATCH | TRANSDERMAL | Status: DC
  Administered 2019-11-08: 1.5 mg via TRANSDERMAL
  Filled 2019-11-08: qty 1

## 2019-11-08 MED ORDER — LACTATED RINGERS IV SOLN
INTRAVENOUS | Status: DC
  Administered 2019-11-08 – 2019-11-09 (×2): via INTRAVENOUS

## 2019-11-08 MED ORDER — SODIUM CHLORIDE 0.9 % IV SOLN
8.0000 mg | Freq: Three times a day (TID) | INTRAVENOUS | Status: DC
  Administered 2019-11-08 – 2019-11-09 (×3): 8 mg via INTRAVENOUS
  Filled 2019-11-08 (×6): qty 4

## 2019-11-08 MED ORDER — SODIUM CHLORIDE 0.9 % IV SOLN
8.0000 mg | Freq: Once | INTRAVENOUS | Status: AC
  Administered 2019-11-08: 8 mg via INTRAVENOUS
  Filled 2019-11-08: qty 4

## 2019-11-08 MED ORDER — M.V.I. ADULT IV INJ
Freq: Once | INTRAVENOUS | Status: AC
  Administered 2019-11-08: 18:00:00 via INTRAVENOUS
  Filled 2019-11-08: qty 10

## 2019-11-08 MED ORDER — FAMOTIDINE IN NACL 20-0.9 MG/50ML-% IV SOLN
20.0000 mg | Freq: Two times a day (BID) | INTRAVENOUS | Status: DC
  Administered 2019-11-09: 20 mg via INTRAVENOUS
  Filled 2019-11-08: qty 50

## 2019-11-08 MED ORDER — GLYCOPYRROLATE 0.2 MG/ML IJ SOLN
0.2000 mg | Freq: Once | INTRAMUSCULAR | Status: AC
  Administered 2019-11-08: 0.2 mg via INTRAVENOUS
  Filled 2019-11-08: qty 1

## 2019-11-08 MED ORDER — DOCUSATE SODIUM 100 MG PO CAPS: 100.0000 mg | ORAL_CAPSULE | Freq: Every day | ORAL | Status: DC

## 2019-11-08 MED ORDER — PRENATAL MULTIVITAMIN CH: 1.0000 | ORAL_TABLET | Freq: Every day | ORAL | Status: DC

## 2019-11-08 NOTE — MAU Note (Signed)
Pt arrived EMS with complaints of vomiting coffee like substance.    Denies vaginal bleeding or LOF.   Reports +FM

## 2019-11-08 NOTE — MAU Note (Signed)
Patient refusing fetal heart rate monitoring because she is throwing up and unable to sit back. Pt would like medication for her vomiting before having to be monitored.   Provider made aware Marlou Porch, CNM

## 2019-11-08 NOTE — MAU Note (Signed)
RN started [facility].   RN asked patient to let nurse know when she was ready to have her fetal monitoring done.

## 2019-11-08 NOTE — H&P (Addendum)
FACULTY PRACTICE ANTEPARTUM ADMISSION HISTORY AND PHYSICAL NOTE   History of Present Illness: Julie Coleman is a 28 y.o. [id number] at [redacted]w[redacted]d admitted for Hyperemesis. She presented MAU via EMS with complaints of emesis. She was recently seen in 11/17 for the same complaints and discharged home after treatment. She reports being unable to keep down food or water and continuously spitting. She was prescribed zofran, reglan and protonix for home use. She reports not taking any medication today - last took medication around 1500 yesterday. She reports vomiting 6+ times today. She reports sharp lower abdominal pain with vomiting and movement - rates pain 6/10 when it occurs- has not taken any pain medication, denies pain currently, Denies abdominal cramping or contractions. She denies vaginal bleeding, discharge or LOF. +FM.   Patient Active Problem List   Diagnosis Date Noted  . Hyperemesis affecting pregnancy, antepartum 10/25/2019  . Heartburn during pregnancy, antepartum 10/25/2019  . Trichomonal vaginitis during pregnancy 10/22/2019  . Anemia affecting pregnancy 10/22/2019    Past Medical History:  Diagnosis Date  . Asthma    albuteral inhaler  . Gestational diabetes   . Hypertension     Past Surgical History:  Procedure Laterality Date  . CESAREAN SECTION    . CHOLECYSTECTOMY      OB History  Gravida Para Term Preterm AB Living  6 3 2 1 2 3   SAB TAB Ectopic Multiple Live Births  1 1     3     # Outcome Date GA Lbr Len/2nd Weight Sex Delivery Anes PTL Lv  6 Current           5 TAB 05/20/18          4 Preterm 02/16/16 [redacted]w[redacted]d    CS-Unspec     3 Term 05/22/12 [redacted]w[redacted]d    Vag-Spont     2 SAB 11/07/10          1 Term 05/29/10 [redacted]w[redacted]d    Vag-Spont       Social History   Socioeconomic History  . Marital status: Single    Spouse name: Not on file  . Number of children: Not on file  . Years of education: Not on file  . Highest education level: Not on file  Occupational History  . Not  on file  Social Needs  . Financial resource strain: Not on file  . Food insecurity    Worry: Not on file    Inability: Not on file  . Transportation needs    Medical: Not on file    Non-medical: Not on file  Tobacco Use  . Smoking status: Never Smoker  . Smokeless tobacco: Never Used  Substance and Sexual Activity  . Alcohol use: Not Currently  . Drug use: Not Currently  . Sexual activity: Not Currently  Lifestyle  . Physical activity    Days per week: Not on file    Minutes per session: Not on file  . Stress: Not on file  Relationships  . Social Herbalist on phone: Not on file    Gets together: Not on file    Attends religious service: Not on file    Active member of club or organization: Not on file    Attends meetings of clubs or organizations: Not on file    Relationship status: Not on file  Other Topics Concern  . Not on file  Social History Narrative  . Not on file    History reviewed. No pertinent family history.  Allergies  Allergen Reactions  . Phenergan [Promethazine Hcl]     Tardive dyskinesia     Medications Prior to Admission  Medication Sig Dispense Refill Last Dose  . metoCLOPramide (REGLAN) 10 MG tablet Take 1 tablet (10 mg total) by mouth every 8 (eight) hours as needed for nausea. 30 tablet 0 11/07/2019 at Unknown time  . ondansetron (ZOFRAN ODT) 4 MG disintegrating tablet Take 1 tablet (4 mg total) by mouth every 8 (eight) hours as needed for nausea or vomiting. 15 tablet 0 11/07/2019 at Unknown time  . pantoprazole (PROTONIX) 40 MG tablet Take 1 tablet (40 mg total) by mouth daily. 30 tablet 0 11/07/2019 at Unknown time  . diphenhydrAMINE (BENADRYL) 25 MG tablet Take 1 tablet (25 mg total) by mouth every 6 (six) hours as needed. 30 tablet 0 More than a month at Unknown time  . iron polysaccharides (NIFEREX) 150 MG capsule Take 1 capsule (150 mg total) by mouth daily. 30 capsule 1     Review of Systems  Respiratory: Negative.    Cardiovascular: Negative.   Gastrointestinal: Positive for abdominal pain, nausea and vomiting. Negative for constipation and diarrhea.  Genitourinary: Negative.   Musculoskeletal: Negative.   Neurological: Negative.    Vitals:  BP 125/72   Pulse 83   Temp 98.9 F (37.2 C)   Resp 16   Wt 76.9 kg   LMP 04/07/2019   SpO2 100%   BMI 26.55 kg/m  Physical Exam  Nursing note and vitals reviewed. Constitutional: She is oriented to person, place, and time. She appears well-developed and well-nourished. She appears distressed.  Patient rocking back and forth upon arrival to MAU, actively vomiting   Cardiovascular: Normal rate and regular rhythm.  Respiratory: Effort normal and breath sounds normal. No respiratory distress. She has no wheezes.  GI: Soft. There is no abdominal tenderness. There is no rebound and no guarding.  Gravid appropriate for gestational age  Musculoskeletal: Normal range of motion.        General: No edema.  Neurological: She is alert and oriented to person, place, and time.  Skin:  Mucous membranes dry   Psychiatric: She has a normal mood and affect. Her behavior is normal. Thought content normal.   Dilation: Closed Effacement (%): Thick Cervical Position: Posterior Exam by:: V Taelar Gronewold CNM  Fetal tracing from [phone] FHR: 140/moderate/ +accels Toco: no UC   Patient then proceeded to take self off monitor at 1537 - reports she can not be on monitor right now, "the straps make me more uncomfortable, I do not want to be monitored right now".  MDM in MAU:  Within 35 minutes of arrival to MAU, patient asking is to stay overnight in hospital   Treatments in MAU included IV LR bolus, banana bag, zofran IV, robinul, and pepcid ordered @1630  CBC and CMP ordered to assess presence of electrolyte imbalances   Upon reassessment @ 1740- patient able to keep down water- no emesis, continued spitting. Patient reports she does not feel better, feels the same as  when she came in.  - scope patch ordered and placed   PO challenge initiated, patient refusing to eat crackers but has kept down 2 cups of water.   Reassessment @ 1920- patient reports she feels the same. Patient weighed: was 76.2kg on 11/17 and today is 76.8kg, no decreasing weight  Consulted with Dr Marice Potterove who will wait for results of urine to determine plan of care. Patient recently gave urine for analysis- pending.  Ketones present on urine, patient's emesis reoccurred and report heartburn with emesis. LR bolus and pepcid ordered. Patient okay with fetal monitoring and monitors replaced.   Dr Marice Potter recommends admission for observation overnight.   Labs:  Results for orders placed or performed during the hospital encounter of 11/08/19 (from the past 24 hour(s))  CBC with Differential   Collection Time: 11/08/19  3:48 PM  Result Value Ref Range   WBC 11.3 (H) 4.0 - 10.5 K/uL   RBC 3.41 (L) 3.87 - 5.11 MIL/uL   Hemoglobin 9.4 (L) 12.0 - 15.0 g/dL   HCT 10.2 (L) 72.5 - 36.6 %   MCV 81.8 80.0 - 100.0 fL   MCH 27.6 26.0 - 34.0 pg   MCHC 33.7 30.0 - 36.0 g/dL   RDW 44.0 (H) 34.7 - 42.5 %   Platelets 209 150 - 400 K/uL   nRBC 0.4 (H) 0.0 - 0.2 %   Neutrophils Relative % 86 %   Neutro Abs 9.6 (H) 1.7 - 7.7 K/uL   Lymphocytes Relative 9 %   Lymphs Abs 1.1 0.7 - 4.0 K/uL   Monocytes Relative 4 %   Monocytes Absolute 0.5 0.1 - 1.0 K/uL   Eosinophils Relative 0 %   Eosinophils Absolute 0.0 0.0 - 0.5 K/uL   Basophils Relative 0 %   Basophils Absolute 0.0 0.0 - 0.1 K/uL   Immature Granulocytes 1 %   Abs Immature Granulocytes 0.11 (H) 0.00 - 0.07 K/uL  CMET STAT   Collection Time: 11/08/19  3:48 PM  Result Value Ref Range   Sodium 139 135 - 145 mmol/L   Potassium 3.3 (L) 3.5 - 5.1 mmol/L   Chloride 97 (L) 98 - 111 mmol/L   CO2 25 22 - 32 mmol/L   Glucose, Bld 101 (H) 70 - 99 mg/dL   BUN <5 (L) 6 - 20 mg/dL   Creatinine, Ser 9.56 0.44 - 1.00 mg/dL   Calcium 9.4 8.9 - 38.7 mg/dL    Total Protein 7.5 6.5 - 8.1 g/dL   Albumin 3.5 3.5 - 5.0 g/dL   AST 37 15 - 41 U/L   ALT 27 0 - 44 U/L   Alkaline Phosphatase 71 38 - 126 U/L   Total Bilirubin 0.7 0.3 - 1.2 mg/dL   GFR calc non Af Amer >60 >60 mL/min   GFR calc Af Amer >60 >60 mL/min   Anion gap 17 (H) 5 - 15  Urinalysis, Routine w reflex microscopic   Collection Time: 11/08/19  6:55 PM  Result Value Ref Range   Color, Urine AMBER (A) YELLOW   APPearance CLEAR CLEAR   Specific Gravity, Urine 1.017 1.005 - 1.030   pH 7.0 5.0 - 8.0   Glucose, UA 50 (A) NEGATIVE mg/dL   Hgb urine dipstick NEGATIVE NEGATIVE   Bilirubin Urine NEGATIVE NEGATIVE   Ketones, ur 80 (A) NEGATIVE mg/dL   Protein, ur 30 (A) NEGATIVE mg/dL   Nitrite NEGATIVE NEGATIVE   Leukocytes,Ua NEGATIVE NEGATIVE   RBC / HPF 0-5 0 - 5 RBC/hpf   WBC, UA 0-5 0 - 5 WBC/hpf   Bacteria, UA RARE (A) NONE SEEN   Mucus PRESENT     Imaging Studies: [place] Mfm Ob Limited  Result Date: 10/25/2019 ----------------------------------------------------------------------  OBSTETRICS REPORT                       (Signed Final 10/25/2019 09:34 am) ---------------------------------------------------------------------- Patient Info  ID #:  [id number]                          D.O.B.:  1991-06-30 (28 yrs)  Name:       KATHYRN WARMUTH                   Visit Date: 10/24/2019 09:24 pm ---------------------------------------------------------------------- Performed By  Performed By:     Tomma Lightning             Ref. Address:     [address]                                                             [place], [place]                                                             [address]  Attending:        Noralee Space MD        [facility]  Referred By:      Lawernce Pitts CNM  ---------------------------------------------------------------------- Orders   #  Description                          Code         Ordered By   1  [place] MFM OB LIMITED                    19147.82     Donette Larry  ----------------------------------------------------------------------   #  Order #                    Accession #                 Episode #   1  [id number]                  [id number]                  [id number]  ---------------------------------------------------------------------- Indications   Decreased fetal movement  W43.1540   Insufficient Prenatal Care (Limited)           O09.30   [redacted] weeks gestation of pregnancy                Z3A.28   Poor obstetric history: Previous preterm       O09.219   delivery, antepartum (@30  wks)  ---------------------------------------------------------------------- Fetal Evaluation  Num Of Fetuses:         1  Fetal Heart Rate(bpm):  146  Cardiac Activity:       Observed  Presentation:           Cephalic  Placenta:               No abruption or previa seen, posterior  Amniotic Fluid  AFI FV:      Within normal limits  AFI Sum(cm)     %Tile       Largest Pocket(cm)  11.54           24          3.31  RUQ(cm)       RLQ(cm)       LUQ(cm)        LLQ(cm)  2.38          2.99          2.86           3.31 ---------------------------------------------------------------------- Biometry  BPD:      67.5  mm     G. Age:  27w 1d          6  %                                                          FL/BPD:     80.9   %    71 - 87  FL:       54.6  mm     G. Age:  28w 6d         43  % ---------------------------------------------------------------------- OB History  Gravidity:    6         Term:   2        Prem:   1        SAB:   1  TOP:          1        Living:  3 ---------------------------------------------------------------------- Gestational Age  LMP:           28w 4d        Date:  04/07/19                 EDD:   01/12/20  U/S Today:     28w 0d                                         EDD:   01/16/20  Best:          28w 4d     Det. By:  LMP  (04/07/19)          EDD:   01/12/20 ---------------------------------------------------------------------- Cervix Uterus Adnexa  Cervix  Length:           5.14  cm.  Normal appearance  by transabdominal scan. ---------------------------------------------------------------------- Impression  Patient was evaluated at the MAU for c/o nausea and  vomiting.  A limited ultrasound study was performed. Amniotic fluid is  normal and good fetal activity is seen. Cephalic presentation. ---------------------------------------------------------------------- Recommendations  -Appointment for detailed ultrasound after discharge. ----------------------------------------------------------------------                  Noralee Space, MD Electronically Signed Final Report   10/25/2019 09:34 am ----------------------------------------------------------------------   Assessment and Plan: Patient Active Problem List   Diagnosis Date Noted  . Hyperemesis affecting pregnancy, antepartum 10/25/2019  . Heartburn during pregnancy, antepartum 10/25/2019  . Trichomonal vaginitis during pregnancy 10/22/2019  . Anemia affecting pregnancy 10/22/2019   Admit to Antenatal for observation Hyperemesis Gravidarum  Routine antenatal care  Steward Drone CNM  Faculty Practice, [facility]

## 2019-11-08 NOTE — MAU Note (Signed)
Covid swab obtained. Pt pulled RNs hand back somewhat but was able to obtain swab. No symptoms

## 2019-11-09 DIAGNOSIS — Z3A32 32 weeks gestation of pregnancy: Secondary | ICD-10-CM | POA: Diagnosis not present

## 2019-11-09 DIAGNOSIS — O219 Vomiting of pregnancy, unspecified: Secondary | ICD-10-CM | POA: Diagnosis not present

## 2019-11-09 DIAGNOSIS — F12188 Cannabis abuse with other cannabis-induced disorder: Secondary | ICD-10-CM | POA: Diagnosis present

## 2019-11-09 LAB — RAPID URINE DRUG SCREEN, HOSP PERFORMED
Amphetamines: NOT DETECTED
Barbiturates: NOT DETECTED
Benzodiazepines: NOT DETECTED
Cocaine: NOT DETECTED
Opiates: NOT DETECTED
Tetrahydrocannabinol: POSITIVE — AB

## 2019-11-09 LAB — SARS CORONAVIRUS 2 (TAT 6-24 HRS): SARS Coronavirus 2: NEGATIVE

## 2019-11-09 MED ORDER — PREPLUS 27-1 MG PO TABS: 1.0000 | ORAL_TABLET | Freq: Every day | ORAL | 13 refills | Status: DC

## 2019-11-09 MED ORDER — SCOPOLAMINE 1 MG/3DAYS TD PT72: 1.0000 | MEDICATED_PATCH | TRANSDERMAL | 12 refills | Status: DC

## 2019-11-09 MED ORDER — KCL-LACTATED RINGERS-D5W 20 MEQ/L IV SOLN
INTRAVENOUS | Status: DC
  Administered 2019-11-09: 08:00:00 via INTRAVENOUS
  Filled 2019-11-09 (×2): qty 1000

## 2019-11-09 MED ORDER — GLYCOPYRROLATE 2 MG PO TABS: 2.0000 mg | ORAL_TABLET | Freq: Three times a day (TID) | ORAL | 3 refills | Status: DC | PRN

## 2019-11-09 MED ORDER — DIPHENHYDRAMINE HCL 25 MG PO TABS: 25.0000 mg | ORAL_TABLET | Freq: Four times a day (QID) | ORAL | 2 refills | Status: DC | PRN

## 2019-11-09 MED ORDER — SODIUM CHLORIDE 0.9 % IV SOLN
510.0000 mg | Freq: Once | INTRAVENOUS | Status: AC
  Administered 2019-11-09: 510 mg via INTRAVENOUS
  Filled 2019-11-09: qty 17

## 2019-11-09 MED ORDER — PANTOPRAZOLE SODIUM 40 MG PO TBEC: 40.0000 mg | DELAYED_RELEASE_TABLET | Freq: Every day | ORAL | 2 refills | Status: DC

## 2019-11-09 MED ORDER — METOCLOPRAMIDE HCL 10 MG PO TABS: 10.0000 mg | ORAL_TABLET | Freq: Four times a day (QID) | ORAL | 2 refills | Status: DC | PRN

## 2019-11-09 MED ORDER — POLYSACCHARIDE IRON COMPLEX 150 MG PO CAPS: 150.0000 mg | ORAL_CAPSULE | Freq: Every day | ORAL | 1 refills | Status: DC

## 2019-11-09 MED ORDER — ONDANSETRON 4 MG PO TBDP: 4.0000 mg | ORAL_TABLET | Freq: Four times a day (QID) | ORAL | 2 refills | Status: DC | PRN

## 2019-11-09 MED FILL — TRANSDERM-SCOP 1.5 MG/72HR: 1 | 30 days supply | Qty: 10 | Fill #0

## 2019-11-09 MED FILL — GLYCOPYRROLATE 2 MG TABS: 2 | 10 days supply | Qty: 30 | Fill #0

## 2019-11-09 MED FILL — PRENATAL FE 27-1 MG TAB: 27-1 | 30 days supply | Qty: 30 | Fill #0

## 2019-11-09 MED FILL — METOCLOPRAMIDE 10 MG TABLET: 10 | 8 days supply | Qty: 30 | Fill #0

## 2019-11-09 MED FILL — ONDANSETRON ODT 4 MG TABLET: 4 | 5 days supply | Qty: 20 | Fill #0

## 2019-11-09 MED FILL — POLY-IRON 150 MG CAPSULE: 150 | 30 days supply | Qty: 30 | Fill #0

## 2019-11-09 NOTE — Progress Notes (Signed)
Pt discharged to home with uncle.  Pt ambulated to car at the [facility]) with Therapist, sports.  RN helped uncle locate pharmacy address on his phone GPS system.  RN also provided verbal directions for getting to the [facility].  Prior to discharge pt ate a few bites of her meal, but said she was having stomach cramps.  Spoke to Dr. Harolyn Rutherford who said to proceed with discharge since pt was able to tolerate PO fluids and reports being able to obtain prescription medications for home.

## 2019-11-09 NOTE — Progress Notes (Signed)
This is a second review of the AM FHR assessment from 11/09/19.

## 2019-11-09 NOTE — Discharge Instructions (Signed)
Morning Sickness  Morning sickness is when a woman feels nauseous during pregnancy. This nauseous feeling may or may not come with vomiting. It often occurs in the morning, but it can be a problem at any time of day. Morning sickness is most common during the first trimester. In some cases, it may continue throughout pregnancy. Although morning sickness is unpleasant, it is usually harmless unless the woman develops severe and continual vomiting (hyperemesis gravidarum), a condition that requires more intense treatment. What are the causes? The exact cause of this condition is not known, but it seems to be related to normal hormonal changes that occur in pregnancy. What increases the risk? You are more likely to develop this condition if:  You experienced nausea or vomiting before your pregnancy.  You had morning sickness during a previous pregnancy.  You are pregnant with more than one baby, such as twins. What are the signs or symptoms? Symptoms of this condition include:  Nausea.  Vomiting. How is this diagnosed? This condition is usually diagnosed based on your signs and symptoms. How is this treated? In many cases, treatment is not needed for this condition. Making some changes to what you eat may help to control symptoms. Your health care provider may also prescribe or recommend:  Vitamin B6 supplements.  Anti-nausea medicines.  Ginger. Follow these instructions at home: Medicines  Take over-the-counter and prescription medicines only as told by your health care provider. Do not use any prescription, over-the-counter, or herbal medicines for morning sickness without first talking with your health care provider.  Taking multivitamins before getting pregnant can prevent or decrease the severity of morning sickness in most women. Eating and drinking  Eat a piece of dry toast or crackers before getting out of bed in the morning.  Eat 5 or 6 small meals a day.  Eat  dry and bland foods, such as rice or a baked potato. Foods that are high in carbohydrates are often helpful.  Avoid greasy, fatty, and spicy foods.  Have someone cook for you if the smell of any food causes nausea and vomiting.  If you feel nauseous after taking prenatal vitamins, take the vitamins at night or with a snack.  Snack on protein foods between meals if you are hungry. Nuts, yogurt, and cheese are good options.  Drink fluids throughout the day.  Try ginger ale made with real ginger, ginger tea made from fresh grated ginger, or ginger candies. General instructions  Do not use any products that contain nicotine or tobacco, such as cigarettes and e-cigarettes. If you need help quitting, ask your health care provider.  Get an air purifier to keep the air in your house free of odors.  Get plenty of fresh air.  Try to avoid odors that trigger your nausea.  Consider trying these methods to help relieve symptoms: ? Wearing an acupressure wristband. These wristbands are often worn for seasickness. ? Acupuncture. Contact a health care provider if:  Your home remedies are not working and you need medicine.  You feel dizzy or light-headed.  You are losing weight. Get help right away if:  You have persistent and uncontrolled nausea and vomiting.  You faint.  You have severe pain in your abdomen. Summary  Morning sickness is when a woman feels nauseous during pregnancy. This nauseous feeling may or may not come with vomiting.  Morning sickness is most common during the first trimester.  It often occurs in the morning, but it can  be a problem at any time of day.  In many cases, treatment is not needed for this condition. Making some changes to what you eat may help to control symptoms. This information is not intended to replace advice given to you by your health care provider. Make sure you discuss any questions you have with your health care provider. Document  Released: 01/27/2007 Document Revised: 11/18/2017 Document Reviewed: 01/08/2017 Elsevier Patient Education  2020 [place]. Cannabinoid Hyperemesis Syndrome Cannabinoid hyperemesis syndrome (CHS) is a condition that causes repeated nausea, vomiting, and abdominal pain after long-term (chronic) use of marijuana (cannabis). People with CHS typically use marijuana 3-5 times a day for many years before they have symptoms, although it is possible to develop CHS with as little as 1 use per day. Symptoms of CHS may be mild at first but can get worse and more frequent. In some cases, CHS may cause vomiting many times a day, which can lead to weight loss and dehydration. CHS may go away and come back many times (recur). People may not have symptoms or may otherwise be healthy in between [facility] attacks. What are the causes? The exact cause of this condition is not known. Long-term use of marijuana may over-stimulate certain proteins in the brain that react with chemicals in marijuana (cannabinoid receptors). This over-stimulation may cause CHS. What are the signs or symptoms? Symptoms of this condition are often mild during the first few attacks, but they can get worse over time. Symptoms may include:  Frequent nausea, especially early in the morning.  Vomiting.  Abdominal pain. Taking several hot showers throughout the day can also be a sign of this condition. People with CHS may do this because it relieves symptoms. How is this diagnosed? This condition may be diagnosed based on:  Your symptoms and medical history, including any drug use.  A physical exam. You may have tests done to rule out other problems. These tests may include:  Blood tests.  Urine tests.  Imaging tests, such as an X-ray or CT scan. How is this treated? Treatment for this condition involves stopping marijuana use. Your health care provider may recommend:  A drug rehabilitation program, if you have trouble stopping  marijuana use.  Medicines for nausea.  Hot showers to help relieve symptoms. Certain creams that contain a substance called capsaicin may improve symptoms when applied to the abdomen. Ask your health care provider before starting any medicines or other treatments. Severe nausea and vomiting may require you to stay at the hospital. You may need IV fluids to prevent or treat dehydration. You may also need certain medicines that must be given at the hospital. Follow these instructions at home: During an attack   Stay in bed and rest in a dark, quiet room.  Take anti-nausea medicine as told by your health care provider.  Try taking hot showers to relieve your symptoms. After an attack  Drink small amounts of clear fluids slowly. Gradually add more.  Once you are able to eat without vomiting, eat soft foods in small amounts every 3-4 hours. General instructions   Do not use any products that contain marijuana.If you need help quitting, ask your health care provider for resources and treatment options.  Drink enough fluid to keep your urine pale yellow. Avoid drinking fluids that have a lot of sugar or caffeine, such as coffee and soda.  Take and apply over-the-counter and prescription medicines only as told by your health care provider. Ask your health care provider  before starting any new medicines or treatments.  Keep all follow-up visits as told by your health care provider. This is important. Contact a health care provider if:  Your symptoms get worse.  You cannot drink fluids without vomiting.  You have pain and trouble swallowing after an attack. Get help right away if:  You cannot stop vomiting.  You have blood in your vomit or your vomit looks like coffee grounds.  You have severe abdominal pain.  You have stools that are bloody or black, or stools that look like tar.  You have symptoms of dehydration, such as: ? Sunken eyes. ? Inability to make tears. ? Cracked  lips. ? Dry mouth. ? Decreased urine production. ? Weakness. ? Sleepiness. ? Fainting. Summary  Cannabinoid hyperemesis syndrome (CHS) is a condition that causes repeated nausea, vomiting, and abdominal pain after long-term use of marijuana.  People with CHS typically use marijuana 3-5 times a day for many years before they have symptoms, although it is possible to develop CHS with as little as 1 use per day.  Treatment for this condition involves stopping marijuana use. Hot showers and capsaicin creams may also help relieve symptoms. Ask your health care provider before starting any medicines or other treatments.  Your health care provider may prescribe medicines to help with nausea.  Get help right away if you have signs of dehydration, such as dry mouth, decreased urine production, or weakness. This information is not intended to replace advice given to you by your health care provider. Make sure you discuss any questions you have with your health care provider. Document Released: 03/16/2017 Document Revised: 04/14/2018 Document Reviewed: 03/16/2017 Elsevier Patient Education  2020 [employer].

## 2019-11-09 NOTE — Progress Notes (Signed)
CSW completed a chart review and met with patient in room 109.  When CSW arrived, MOB was resting in bed eating ice.  CSW introduced herself and explained CSW's role.  Per patient, patient remembered CSW from a previous admission.    CSW assessed for psychosocial stressors and patient denied all stressors. Patient reported that she has been approved for $209 in [place] and has a stable place to stay (patient resides with her mother Shreena Baines).  Patient also reported "I came to the hospital because I didn't have any money to pay for my medicine but since I've been here my mom called and said my Medicaid card came today."  Patient denied barriers to local pharmacy to obtain medications.   CSW assessed for other needs and patient communicated needing a car seat and bed for infant when infant is born.  CSW processed with patient resources that can assist with obtaining items. CSW also encouraged patient to speak with her mother about assisting with obtaining items.   CSW also provided patient with information to apply for [facility] and patient agreed to call and make an appointment today.   There is no barriers to discharge.   Laurey Arrow, MSW, LCSW Clinical Social Work [phone]

## 2019-11-09 NOTE — Progress Notes (Addendum)
Patient ID: Julie Coleman, female   DOB: 11/01/1991, 27 y.o.   MRN: [id number] HD #2  S. She is able to keep water down but hasn't tried any food.  O. VSS, AF Breathing, conversing, and ambulating normally Well nourished, well hydrated Black female, no apparent distress Abd- benign, gravid  A/P. 30.[redacted] weeks EGA with hyperemesis We had a long discussion about a possible cause of her hyperemesis which is the fact that she smokes an entire blunt daily. I have strongly rec'd that she decrease/stop this practice  I have changed her IVFs to D5LR with 20 meq of KCL  Anemia in pregnancy- Her hbg from last night was 9.4. She was as low as 7.7 earlier this month but she had an iron infusion recently.

## 2019-11-09 NOTE — Discharge Summary (Signed)
Antenatal Physician Discharge Summary  Patient ID: Julie Coleman MRN: [id number] DOB/AGE: 17-Mar-1991 28 y.o.  Admit date: 11/08/2019 Discharge date: 11/09/2019  Admission Diagnoses:  Principal Problem:   Nausea and vomiting in pregnancy Active Problems:   Anemia affecting pregnancy   Heartburn during pregnancy, antepartum   Cannabis hyperemesis syndrome concurrent with and due to cannabis abuse [facility])  Discharge Diagnoses: The same  Prenatal Procedures: NST  Hospital Course:  This is a 28 y.o. [id number] with IUP at [redacted]w[redacted]d admitted for Nausea and Vomiting; known Cannabis User. This is her second admission given severity of her symptoms. Patient had not been able to procure her outpatient medications/antimetics due to lack of insurance; but today, she reported her Medicaid was now activated and she can get her medications.  She also was counseled about cutting down on her cannabis use.  She was treated with IV hydration and multiple antiemetics while under observation.  By time of discharge, she was tolerating fluids and a little solid food.  No abdominal pain, leaking of fluid and no bleeding.  Good fetal movement, and reassuring FHR tracing.  Of note, she was given her second dose of Feraheme for treatment of her anemia; appointment on 11/13/19 was cancelled.  She had no signs/symptoms of progressing preterm labor or other maternal-fetal concerns. She was deemed stable for discharge to home with outpatient follow up, has an initial OB appointment next week at [place] (she moved from [place]).  Discharge Exam: Temp:  [97.5 F (36.4 C)-99.3 F (37.4 C)] 97.5 F (36.4 C) (11/20 1106) Pulse Rate:  [66-99] 66 (11/20 1106) Resp:  [12-19] 16 (11/20 1106) BP: (116-136)/(68-85) 130/70 (11/20 1106) SpO2:  [98 %-100 %] 100 % (11/20 1106) Weight:  [76.9 kg] 76.9 kg (11/19 1916) Physical Examination: CONSTITUTIONAL: Well-developed, well-nourished female in no acute distress.  SKIN: Skin is warm and  dry. No rash noted. Not diaphoretic. No erythema. No pallor. CARDIOVASCULAR: Normal heart rate noted RESPIRATORY: Effort and breath sounds normal, no problems with respiration noted MUSCULOSKELETAL: Normal range of motion. No edema and no tenderness. 2+ distal pulses. ABDOMEN: Soft, nontender, nondistended, gravid. CERVIX: Dilation: Closed Effacement (%): Thick Cervical Position: Posterior Exam by:: V Rogers CNM  Fetal monitoring: FHR: 120 bpm, Variability: moderate, Accelerations: Present, Decelerations: Absent  Uterine activity: No contractions  Significant Diagnostic Studies:  Results for orders placed or performed during the hospital encounter of 11/08/19 (from the past 168 hour(s))  CBC with Differential   Collection Time: 11/08/19  3:48 PM  Result Value Ref Range   WBC 11.3 (H) 4.0 - 10.5 K/uL   RBC 3.41 (L) 3.87 - 5.11 MIL/uL   Hemoglobin 9.4 (L) 12.0 - 15.0 g/dL   HCT 14.7 (L) 82.9 - 56.2 %   MCV 81.8 80.0 - 100.0 fL   MCH 27.6 26.0 - 34.0 pg   MCHC 33.7 30.0 - 36.0 g/dL   RDW 13.0 (H) 86.5 - 78.4 %   Platelets 209 150 - 400 K/uL   nRBC 0.4 (H) 0.0 - 0.2 %   Neutrophils Relative % 86 %   Neutro Abs 9.6 (H) 1.7 - 7.7 K/uL   Lymphocytes Relative 9 %   Lymphs Abs 1.1 0.7 - 4.0 K/uL   Monocytes Relative 4 %   Monocytes Absolute 0.5 0.1 - 1.0 K/uL   Eosinophils Relative 0 %   Eosinophils Absolute 0.0 0.0 - 0.5 K/uL   Basophils Relative 0 %   Basophils Absolute 0.0 0.0 - 0.1 K/uL   Immature Granulocytes  1 %   Abs Immature Granulocytes 0.11 (H) 0.00 - 0.07 K/uL  CMET STAT   Collection Time: 11/08/19  3:48 PM  Result Value Ref Range   Sodium 139 135 - 145 mmol/L   Potassium 3.3 (L) 3.5 - 5.1 mmol/L   Chloride 97 (L) 98 - 111 mmol/L   CO2 25 22 - 32 mmol/L   Glucose, Bld 101 (H) 70 - 99 mg/dL   BUN <5 (L) 6 - 20 mg/dL   Creatinine, Ser 0.79 0.44 - 1.00 mg/dL   Calcium 9.4 8.9 - 10.3 mg/dL   Total Protein 7.5 6.5 - 8.1 g/dL   Albumin 3.5 3.5 - 5.0 g/dL   AST 37 15 -  41 U/L   ALT 27 0 - 44 U/L   Alkaline Phosphatase 71 38 - 126 U/L   Total Bilirubin 0.7 0.3 - 1.2 mg/dL   GFR calc non Af Amer >60 >60 mL/min   GFR calc Af Amer >60 >60 mL/min   Anion gap 17 (H) 5 - 15  Urinalysis, Routine w reflex microscopic   Collection Time: 11/08/19  6:55 PM  Result Value Ref Range   Color, Urine AMBER (A) YELLOW   APPearance CLEAR CLEAR   Specific Gravity, Urine 1.017 1.005 - 1.030   pH 7.0 5.0 - 8.0   Glucose, UA 50 (A) NEGATIVE mg/dL   Hgb urine dipstick NEGATIVE NEGATIVE   Bilirubin Urine NEGATIVE NEGATIVE   Ketones, ur 80 (A) NEGATIVE mg/dL   Protein, ur 30 (A) NEGATIVE mg/dL   Nitrite NEGATIVE NEGATIVE   Leukocytes,Ua NEGATIVE NEGATIVE   RBC / HPF 0-5 0 - 5 RBC/hpf   WBC, UA 0-5 0 - 5 WBC/hpf   Bacteria, UA RARE (A) NONE SEEN   Mucus PRESENT   Urine rapid drug screen (hosp performed)   Collection Time: 11/08/19  6:55 PM  Result Value Ref Range   Opiates NONE DETECTED NONE DETECTED   Cocaine NONE DETECTED NONE DETECTED   Benzodiazepines NONE DETECTED NONE DETECTED   Amphetamines NONE DETECTED NONE DETECTED   Tetrahydrocannabinol POSITIVE (A) NONE DETECTED   Barbiturates NONE DETECTED NONE DETECTED  SARS CORONAVIRUS 2 (TAT 6-24 HRS) Nasopharyngeal Nasopharyngeal Swab   Collection Time: 11/08/19  8:17 PM   Specimen: Nasopharyngeal Swab  Result Value Ref Range   SARS Coronavirus 2 NEGATIVE NEGATIVE  Type and screen [place]   Collection Time: 11/08/19  8:53 PM  Result Value Ref Range   ABO/RH(D) O POS    Antibody Screen NEG    Sample Expiration      11/11/2019,2359 Performed at [facility], [address] [place], [place] [address]   Results for orders placed or performed during the hospital encounter of 11/06/19 (from the past 168 hour(s))  CBC   Collection Time: 11/06/19 10:53 AM  Result Value Ref Range   WBC 7.7 4.0 - 10.5 K/uL   RBC 2.86 (L) 3.87 - 5.11 MIL/uL   Hemoglobin 7.7 (L) 12.0 - 15.0 g/dL   HCT  23.3 (L) 36.0 - 46.0 %   MCV 81.5 80.0 - 100.0 fL   MCH 26.9 26.0 - 34.0 pg   MCHC 33.0 30.0 - 36.0 g/dL   RDW 15.7 (H) 11.5 - 15.5 %   Platelets 179 150 - 400 K/uL   nRBC 0.0 0.0 - 0.2 %  RPR   Collection Time: 11/06/19 10:53 AM  Result Value Ref Range   RPR Ser Ql NON REACTIVE NON REACTIVE  HIV antibody   Collection Time: 11/06/19 10:53 AM  Result Value Ref Range   HIV Screen 4th Generation wRfx Non Reactive (A) Non Reactive  Rubella screen   Collection Time: 11/06/19 10:53 AM  Result Value Ref Range   Rubella 0.92 (L) Immune >0.99 index  Hepatitis B surface antigen   Collection Time: 11/06/19 10:53 AM  Result Value Ref Range   Hepatitis B Surface Ag NEGATIVE (A) NON REACTIVE  Comprehensive metabolic panel   Collection Time: 11/06/19 10:53 AM  Result Value Ref Range   Sodium 134 (L) 135 - 145 mmol/L   Potassium 3.2 (L) 3.5 - 5.1 mmol/L   Chloride 97 (L) 98 - 111 mmol/L   CO2 26 22 - 32 mmol/L   Glucose, Bld 86 70 - 99 mg/dL   BUN 5 (L) 6 - 20 mg/dL   Creatinine, Ser 2.13 0.44 - 1.00 mg/dL   Calcium 8.6 (L) 8.9 - 10.3 mg/dL   Total Protein 6.1 (L) 6.5 - 8.1 g/dL   Albumin 2.7 (L) 3.5 - 5.0 g/dL   AST 28 15 - 41 U/L   ALT 20 0 - 44 U/L   Alkaline Phosphatase 55 38 - 126 U/L   Total Bilirubin 0.4 0.3 - 1.2 mg/dL   GFR calc non Af Amer >60 >60 mL/min   GFR calc Af Amer >60 >60 mL/min   Anion gap 11 5 - 15  ABO/Rh   Collection Time: 11/06/19 10:53 AM  Result Value Ref Range   ABO/RH(D)      O POS Performed at [facility], [address], [place], [place] [address]    [place] Mfm Ob Limited  Result Date: 10/25/2019 ----------------------------------------------------------------------  OBSTETRICS REPORT                       (Signed Final 10/25/2019 09:34 am) ---------------------------------------------------------------------- Patient Info  ID #:       [id number]                          D.O.B.:  [birth year] (28 yrs)  Name:       Julie Coleman                   Visit  Date: 10/24/2019 09:24 pm ---------------------------------------------------------------------- Performed By  Performed By:     Tomma Lightning             Ref. Address:     [address]                                                             [place], [place]  [address]  Attending:        Noralee Space MD        [facility]  Referred By:      Lawernce Pitts CNM ---------------------------------------------------------------------- Orders   #  Description                          Code         Ordered By   1  [place] MFM OB LIMITED                    [phone]     MELANIE BHAMBRI  ----------------------------------------------------------------------   #  Order #                    Accession #                 Episode #   1  [id number]                  [id number]                  [id number]  ---------------------------------------------------------------------- Indications   Decreased fetal movement                       O36.8190   Insufficient Prenatal Care (Limited)           O09.30   [redacted] weeks gestation of pregnancy                Z3A.28   Poor obstetric history: Previous preterm       O09.219   delivery, antepartum (@30  wks)  ---------------------------------------------------------------------- Fetal Evaluation  Num Of Fetuses:         1  Fetal Heart Rate(bpm):  146  Cardiac Activity:       Observed  Presentation:           Cephalic  Placenta:               No abruption or previa seen, posterior  Amniotic Fluid  AFI FV:      Within normal limits  AFI Sum(cm)     %Tile       Largest Pocket(cm)  11.54           24          3.31  RUQ(cm)       RLQ(cm)       LUQ(cm)        LLQ(cm)  2.38          2.99          2.86           3.31  ---------------------------------------------------------------------- Biometry  BPD:      67.5  mm     G. Age:  27w 1d          6  %  FL/BPD:     80.9   %    71 - 87  FL:       54.6  mm     G. Age:  28w 6d         43  % ---------------------------------------------------------------------- OB History  Gravidity:    6         Term:   2        Prem:   1        SAB:   1  TOP:          1        Living:  3 ---------------------------------------------------------------------- Gestational Age  LMP:           28w 4d        Date:  04/07/19                 EDD:   01/12/20  U/S Today:     28w 0d                                        EDD:   01/16/20  Best:          28w 4d     Det. By:  LMP  (04/07/19)          EDD:   01/12/20 ---------------------------------------------------------------------- Cervix Uterus Adnexa  Cervix  Length:           5.14  cm.  Normal appearance by transabdominal scan. ---------------------------------------------------------------------- Impression  Patient was evaluated at the MAU for c/o nausea and  vomiting.  A limited ultrasound study was performed. Amniotic fluid is  normal and good fetal activity is seen. Cephalic presentation. ---------------------------------------------------------------------- Recommendations  -Appointment for detailed ultrasound after discharge. ----------------------------------------------------------------------                  Noralee Spaceavi Shankar, MD Electronically Signed Final Report   10/25/2019 09:34 am ----------------------------------------------------------------------   Future Appointments  Date Time Provider Department Center  11/14/2019  9:30 AM [facility] OB INTAKE [facility]  11/21/2019  1:35 PM Adam PhenixArnold, James G, MD [facility]    Discharge Condition: Stable  Discharge disposition: 01-Home or Self Care        Allergies as of 11/09/2019      Reactions   Phenergan [promethazine Hcl]     Tardive dyskinesia       Medication List    TAKE these medications   diphenhydrAMINE 25 MG tablet Commonly known as: BENADRYL Take 1 tablet (25 mg total) by mouth every 6 (six) hours as needed (nausea). What changed: reasons to take this   glycopyrrolate 2 MG tablet Commonly known as: ROBINUL Take 1 tablet (2 mg total) by mouth 3 (three) times daily as needed.   iron polysaccharides 150 MG capsule Commonly known as: NIFEREX Take 1 capsule (150 mg total) by mouth daily.   metoCLOPramide 10 MG tablet Commonly known as: REGLAN Take 1 tablet (10 mg total) by mouth every 6 (six) hours as needed for nausea or vomiting. What changed:   when to take this  reasons to take this   ondansetron 4 MG disintegrating tablet Commonly known as: Zofran ODT Take 1 tablet (4 mg total) by mouth every 6 (six) hours as needed for nausea or vomiting. What changed: when to take this   pantoprazole 40 MG tablet Commonly known as: Protonix Take 1 tablet (40  mg total) by mouth daily.   PrePLUS 27-1 MG Tabs Take 1 tablet by mouth daily.   scopolamine 1 MG/3DAYS Commonly known as: TRANSDERM-SCOP Place 1 patch (1.5 mg total) onto the skin every 3 (three) days. Start taking on: November 11, 2019        Signed: Jaynie Collins M.D. 11/09/2019, 3:03 PM

## 2019-11-12 ENCOUNTER — Encounter: Payer: Self-pay | Admitting: Student

## 2019-11-12 DIAGNOSIS — Z283 Underimmunization status: Secondary | ICD-10-CM | POA: Insufficient documentation

## 2019-11-12 DIAGNOSIS — Z2839 Other underimmunization status: Secondary | ICD-10-CM | POA: Insufficient documentation

## 2019-11-13 ENCOUNTER — Other Ambulatory Visit: Payer: Self-pay

## 2019-11-13 ENCOUNTER — Encounter (HOSPITAL_COMMUNITY): Payer: Medicaid Other

## 2019-11-13 ENCOUNTER — Inpatient Hospital Stay (HOSPITAL_COMMUNITY)
Admission: AD | Admit: 2019-11-13 | Discharge: 2019-11-15 | DRG: 832 | Disposition: A | Discharge status: Home / Self Care | Payer: Medicaid Other | Attending: Family Medicine | Admitting: Family Medicine

## 2019-11-13 ENCOUNTER — Encounter (HOSPITAL_COMMUNITY): Payer: Self-pay | Admitting: *Deleted

## 2019-11-13 DIAGNOSIS — O99019 Anemia complicating pregnancy, unspecified trimester: Secondary | ICD-10-CM | POA: Diagnosis present

## 2019-11-13 DIAGNOSIS — O34219 Maternal care for unspecified type scar from previous cesarean delivery: Secondary | ICD-10-CM | POA: Diagnosis present

## 2019-11-13 DIAGNOSIS — O99013 Anemia complicating pregnancy, third trimester: Secondary | ICD-10-CM | POA: Diagnosis present

## 2019-11-13 DIAGNOSIS — O0933 Supervision of pregnancy with insufficient antenatal care, third trimester: Secondary | ICD-10-CM

## 2019-11-13 DIAGNOSIS — O219 Vomiting of pregnancy, unspecified: Secondary | ICD-10-CM | POA: Diagnosis present

## 2019-11-13 DIAGNOSIS — Z3A31 31 weeks gestation of pregnancy: Secondary | ICD-10-CM

## 2019-11-13 DIAGNOSIS — Z20828 Contact with and (suspected) exposure to other viral communicable diseases: Secondary | ICD-10-CM | POA: Diagnosis present

## 2019-11-13 DIAGNOSIS — F12188 Cannabis abuse with other cannabis-induced disorder: Secondary | ICD-10-CM | POA: Diagnosis present

## 2019-11-13 DIAGNOSIS — A5901 Trichomonal vulvovaginitis: Secondary | ICD-10-CM | POA: Diagnosis present

## 2019-11-13 DIAGNOSIS — O99323 Drug use complicating pregnancy, third trimester: Secondary | ICD-10-CM | POA: Diagnosis present

## 2019-11-13 DIAGNOSIS — O210 Mild hyperemesis gravidarum: Secondary | ICD-10-CM | POA: Diagnosis present

## 2019-11-13 DIAGNOSIS — R1116 Cannabis hyperemesis syndrome: Secondary | ICD-10-CM | POA: Diagnosis present

## 2019-11-13 DIAGNOSIS — O98313 Other infections with a predominantly sexual mode of transmission complicating pregnancy, third trimester: Secondary | ICD-10-CM | POA: Diagnosis present

## 2019-11-13 DIAGNOSIS — D649 Anemia, unspecified: Secondary | ICD-10-CM | POA: Diagnosis present

## 2019-11-13 LAB — AMNISURE RUPTURE OF MEMBRANE (ROM) NOT AT ARMC: Amnisure ROM: NEGATIVE

## 2019-11-13 LAB — CBC
HCT: 28.1 % — ABNORMAL LOW (ref 36.0–46.0)
Hemoglobin: 9.4 g/dL — ABNORMAL LOW (ref 12.0–15.0)
MCH: 28.5 pg (ref 26.0–34.0)
MCHC: 33.5 g/dL (ref 30.0–36.0)
MCV: 85.2 fL (ref 80.0–100.0)
Platelets: 191 10*3/uL (ref 150–400)
RBC: 3.3 MIL/uL — ABNORMAL LOW (ref 3.87–5.11)
RDW: 18.8 % — ABNORMAL HIGH (ref 11.5–15.5)
WBC: 11.9 10*3/uL — ABNORMAL HIGH (ref 4.0–10.5)
nRBC: 0.2 % (ref 0.0–0.2)

## 2019-11-13 LAB — URINALYSIS, ROUTINE W REFLEX MICROSCOPIC
Bilirubin Urine: NEGATIVE
Glucose, UA: NEGATIVE mg/dL
Hgb urine dipstick: NEGATIVE
Ketones, ur: 80 mg/dL — AB
Leukocytes,Ua: NEGATIVE
Nitrite: NEGATIVE
Protein, ur: NEGATIVE mg/dL
Specific Gravity, Urine: 1.014 (ref 1.005–1.030)
pH: 7 (ref 5.0–8.0)

## 2019-11-13 LAB — TYPE AND SCREEN
ABO/RH(D): O POS
Antibody Screen: NEGATIVE

## 2019-11-13 LAB — COMPREHENSIVE METABOLIC PANEL
ALT: 29 U/L (ref 0–44)
AST: 31 U/L (ref 15–41)
Albumin: 3 g/dL — ABNORMAL LOW (ref 3.5–5.0)
Alkaline Phosphatase: 72 U/L (ref 38–126)
Anion gap: 12 (ref 5–15)
BUN: <5 mg/dL — ABNORMAL LOW (ref 6–20)
CO2: 25 mmol/L (ref 22–32)
Calcium: 8.9 mg/dL (ref 8.9–10.3)
Chloride: 98 mmol/L (ref 98–111)
Creatinine, Ser: 0.66 mg/dL (ref 0.44–1.00)
GFR calc Af Amer: >60 mL/min (ref >60)
GFR calc non Af Amer: >60 mL/min (ref >60)
Glucose, Bld: 101 mg/dL — ABNORMAL HIGH (ref 70–99)
Potassium: 3.2 mmol/L — ABNORMAL LOW (ref 3.5–5.1)
Sodium: 135 mmol/L (ref 135–145)
Total Bilirubin: 0.7 mg/dL (ref 0.3–1.2)
Total Protein: 6.5 g/dL (ref 6.5–8.1)

## 2019-11-13 LAB — WET PREP, GENITAL
Clue Cells Wet Prep HPF POC: NONE SEEN
Sperm: NONE SEEN
Trich, Wet Prep: NONE SEEN
Yeast Wet Prep HPF POC: NONE SEEN

## 2019-11-13 MED ORDER — SCOPOLAMINE 1 MG/3DAYS TD PT72
1.0000 | MEDICATED_PATCH | Freq: Once | TRANSDERMAL | Status: DC
  Administered 2019-11-13: 1.5 mg via TRANSDERMAL
  Filled 2019-11-13: qty 1

## 2019-11-13 MED ORDER — PANTOPRAZOLE SODIUM 40 MG PO TBEC
40.0000 mg | DELAYED_RELEASE_TABLET | Freq: Two times a day (BID) | ORAL | Status: DC
  Administered 2019-11-13 – 2019-11-15 (×4): 40 mg via ORAL
  Filled 2019-11-13 (×4): qty 1

## 2019-11-13 MED ORDER — DOCUSATE SODIUM 100 MG PO CAPS
100.0000 mg | ORAL_CAPSULE | Freq: Every day | ORAL | Status: DC
  Administered 2019-11-14 – 2019-11-15 (×2): 100 mg via ORAL
  Filled 2019-11-13 (×2): qty 1

## 2019-11-13 MED ORDER — POTASSIUM CHLORIDE 2 MEQ/ML IV SOLN
INTRAVENOUS | Status: DC
  Administered 2019-11-13 – 2019-11-15 (×6): via INTRAVENOUS
  Filled 2019-11-13 (×9): qty 1000

## 2019-11-13 MED ORDER — ONDANSETRON HCL 4 MG/2ML IJ SOLN
4.0000 mg | Freq: Once | INTRAMUSCULAR | Status: AC
  Administered 2019-11-13: 4 mg via INTRAVENOUS
  Filled 2019-11-13: qty 2

## 2019-11-13 MED ORDER — ACETAMINOPHEN 325 MG PO TABS
650.0000 mg | ORAL_TABLET | ORAL | Status: DC | PRN
  Administered 2019-11-13: 650 mg via ORAL
  Filled 2019-11-13: qty 2

## 2019-11-13 MED ORDER — ONDANSETRON 4 MG PO TBDP
4.0000 mg | ORAL_TABLET | Freq: Four times a day (QID) | ORAL | Status: DC
  Administered 2019-11-13 – 2019-11-15 (×7): 4 mg via ORAL
  Filled 2019-11-13 (×7): qty 1

## 2019-11-13 MED ORDER — CALCIUM CARBONATE ANTACID 500 MG PO CHEW: 2.0000 | CHEWABLE_TABLET | ORAL | Status: DC | PRN

## 2019-11-13 MED ORDER — ZOLPIDEM TARTRATE 5 MG PO TABS: 5.0000 mg | ORAL_TABLET | Freq: Every evening | ORAL | Status: DC | PRN

## 2019-11-13 MED ORDER — LACTATED RINGERS IV BOLUS
1000.0000 mL | Freq: Once | INTRAVENOUS | Status: AC
  Administered 2019-11-13: 11:00:00 1000 mL via INTRAVENOUS

## 2019-11-13 MED ORDER — DIPHENHYDRAMINE HCL 25 MG PO CAPS
25.0000 mg | ORAL_CAPSULE | Freq: Four times a day (QID) | ORAL | Status: DC | PRN
  Filled 2019-11-13: qty 1

## 2019-11-13 MED ORDER — FAMOTIDINE IN NACL 20-0.9 MG/50ML-% IV SOLN
20.0000 mg | Freq: Once | INTRAVENOUS | Status: AC
  Administered 2019-11-13: 11:00:00 20 mg via INTRAVENOUS
  Filled 2019-11-13: qty 50

## 2019-11-13 MED ORDER — PANTOPRAZOLE SODIUM 40 MG IV SOLR
40.0000 mg | Freq: Once | INTRAVENOUS | Status: AC
  Administered 2019-11-13: 40 mg via INTRAVENOUS
  Filled 2019-11-13: qty 40

## 2019-11-13 MED ORDER — GLYCOPYRROLATE 1 MG PO TABS
2.0000 mg | ORAL_TABLET | Freq: Three times a day (TID) | ORAL | Status: DC
  Administered 2019-11-13 – 2019-11-15 (×5): 2 mg via ORAL
  Filled 2019-11-13 (×5): qty 2

## 2019-11-13 MED ORDER — SODIUM CHLORIDE 0.9 % IV SOLN
8.0000 mg | Freq: Once | INTRAVENOUS | Status: AC
  Administered 2019-11-13: 8 mg via INTRAVENOUS
  Filled 2019-11-13: qty 4

## 2019-11-13 MED ORDER — SCOPOLAMINE 1 MG/3DAYS TD PT72: 1.0000 | MEDICATED_PATCH | TRANSDERMAL | Status: DC

## 2019-11-13 MED ORDER — METOCLOPRAMIDE HCL 10 MG PO TABS: 10.0000 mg | ORAL_TABLET | Freq: Four times a day (QID) | ORAL | Status: DC | PRN

## 2019-11-13 MED ORDER — PRENATAL MULTIVITAMIN CH
1.0000 | ORAL_TABLET | Freq: Every day | ORAL | Status: DC
  Filled 2019-11-13: qty 1

## 2019-11-13 MED ORDER — METOCLOPRAMIDE HCL 5 MG/ML IJ SOLN
10.0000 mg | Freq: Once | INTRAMUSCULAR | Status: AC
  Administered 2019-11-13: 13:00:00 10 mg via INTRAVENOUS
  Filled 2019-11-13: qty 2

## 2019-11-13 MED ORDER — DEXTROSE 5 % IN LACTATED RINGERS IV BOLUS
1000.0000 mL | Freq: Once | INTRAVENOUS | Status: AC
  Administered 2019-11-13: 17:00:00 1000 mL via INTRAVENOUS

## 2019-11-13 NOTE — H&P (Signed)
FACULTY PRACTICE ANTEPARTUM ADMISSION HISTORY AND PHYSICAL NOTE   History of Present Illness: Julie Coleman is a 28 y.o. [id number] at [redacted]w[redacted]d admitted for hyperemesis gravidarum. Patient recently moved here from [place] has not had prenatal care. Has had multiple MAU visits & 2 admissions this month for uncontrolled n/v.  Presented to MAU this morning via EMS for abdominal pain, n/v, & LOF. Patient found to not be laboring & had a negative amnisure.  Given IV fluids (LR, LR w/KCL, & D5LR), 2 doses of zofran, reglan, pepcid, protonix, & a new scop patch was applied. Patient continues to report feeling nauseated & states she is continuing to vomit.  Has had UDS positive for cannabinol. Pt reports she last smoked 3 days ago.   Patient reports the fetal movement as active. Patient reports uterine contraction  activity as irregular. Patient reports  vaginal bleeding as none. Patient describes fluid per vagina as None. Fetal presentation is unsure.  Patient Active Problem List   Diagnosis Date Noted  . Hyperemesis affecting pregnancy, antepartum 11/13/2019  . Rubella non-immune status, antepartum 11/12/2019  . Cannabis hyperemesis syndrome concurrent with and due to cannabis abuse ([facility]) 11/09/2019  . Nausea and vomiting in pregnancy 11/08/2019  . Heartburn during pregnancy, antepartum 10/25/2019  . Trichomonal vaginitis during pregnancy 10/22/2019  . Anemia affecting pregnancy 10/22/2019    Past Medical History:  Diagnosis Date  . Asthma    albuteral inhaler  . Gestational diabetes   . Hypertension     Past Surgical History:  Procedure Laterality Date  . CESAREAN SECTION    . CHOLECYSTECTOMY      OB History  Gravida Para Term Preterm AB Living  6 3 2 1 2 3   SAB TAB Ectopic Multiple Live Births  1 1     3     # Outcome Date GA Lbr Len/2nd Weight Sex Delivery Anes PTL Lv  6 Current           5 TAB 05/20/18          4 Preterm 02/16/16 [redacted]w[redacted]d    CS-Unspec     3 Term 05/22/12 [redacted]w[redacted]d     Vag-Spont     2 SAB 11/07/10          1 Term 05/29/10 [redacted]w[redacted]d    Vag-Spont       Social History   Socioeconomic History  . Marital status: Single    Spouse name: Not on file  . Number of children: Not on file  . Years of education: Not on file  . Highest education level: Not on file  Occupational History  . Not on file  Social Needs  . Financial resource strain: Not on file  . Food insecurity    Worry: Not on file    Inability: Not on file  . Transportation needs    Medical: Not on file    Non-medical: Not on file  Tobacco Use  . Smoking status: Never Smoker  . Smokeless tobacco: Never Used  Substance and Sexual Activity  . Alcohol use: Not Currently  . Drug use: Not Currently  . Sexual activity: Not Currently  Lifestyle  . Physical activity    Days per week: Not on file    Minutes per session: Not on file  . Stress: Not on file  Relationships  . Social Herbalist on phone: Not on file    Gets together: Not on file    Attends religious service: Not on file  Active member of club or organization: Not on file    Attends meetings of clubs or organizations: Not on file    Relationship status: Not on file  Other Topics Concern  . Not on file  Social History Narrative  . Not on file    History reviewed. No pertinent family history.  Allergies  Allergen Reactions  . Phenergan [Promethazine Hcl]     Tardive dyskinesia     Medications Prior to Admission  Medication Sig Dispense Refill Last Dose  . diphenhydrAMINE (BENADRYL) 25 MG tablet Take 1 tablet (25 mg total) by mouth every 6 (six) hours as needed (nausea). 30 tablet 2 Past Month at Unknown time  . glycopyrrolate (ROBINUL) 2 MG tablet Take 1 tablet (2 mg total) by mouth 3 (three) times daily as needed. 30 tablet 3 Past Week at Unknown time  . iron polysaccharides (NIFEREX) 150 MG capsule Take 1 capsule (150 mg total) by mouth daily. 30 capsule 1 11/12/2019 at Unknown time  . metoCLOPramide  (REGLAN) 10 MG tablet Take 1 tablet (10 mg total) by mouth every 6 (six) hours as needed for nausea or vomiting. 30 tablet 2 11/12/2019 at Unknown time  . ondansetron (ZOFRAN ODT) 4 MG disintegrating tablet Take 1 tablet (4 mg total) by mouth every 6 (six) hours as needed for nausea or vomiting. 20 tablet 2 11/12/2019 at Unknown time  . pantoprazole (PROTONIX) 40 MG tablet Take 1 tablet (40 mg total) by mouth daily. 30 tablet 2 Past Week at Unknown time  . Prenatal Vit-Fe Fumarate-FA (PREPLUS) 27-1 MG TABS Take 1 tablet by mouth daily. 30 tablet 13 11/12/2019 at Unknown time  . scopolamine (TRANSDERM-SCOP) 1 MG/3DAYS Place 1 patch (1.5 mg total) onto the skin every 3 (three) days. 10 patch 12 Past Week at Unknown time    Review of Systems - History obtained from the patient General ROS: negative for - chills or fever Cardiovascular ROS: no chest pain or dyspnea on exertion Gastrointestinal ROS: positive for - abdominal pain and nausea/vomiting negative for - blood in stools, change in bowel habits, change in stools, constipation, diarrhea or heartburn Genito-Urinary ROS: negative for - vaginal bleeding  Vitals:  BP 124/66   Pulse 98   Temp 97.8 F (36.6 C)   Resp 16   Wt 77.1 kg   LMP 04/07/2019   SpO2 100%   BMI 26.63 kg/m  Physical Examination: CONSTITUTIONAL: Well-developed, well-nourished female in no acute distress.  HENT:  Normocephalic, atraumatic, External right and left ear normal. Oropharynx is clear and moist EYES: Conjunctivae and EOM are normal. Pupils are equal, round, and reactive to light. No scleral icterus.  NECK: Normal range of motion, supple, no masses SKIN: Skin is warm and dry. No rash noted. Not diaphoretic. No erythema. No pallor. NEUROLGIC: Alert and oriented to person, place, and time. Normal reflexes, muscle tone coordination. No cranial nerve deficit noted. PSYCHIATRIC: Normal mood and affect. Normal behavior. Normal judgment and thought  content. CARDIOVASCULAR: Normal heart rate noted, regular rhythm RESPIRATORY: Effort and breath sounds normal, no problems with respiration noted ABDOMEN: Soft, nontender, nondistended, gravid. MUSCULOSKELETAL: Normal range of motion. No edema and no tenderness. 2+ distal pulses.  Cervix: Dilation: Closed Effacement (%): 50 Cervical Position: Middle Station: -3 Exam by:: Judeth HornErin Gaige Fussner NP Membranes:intact Fetal Monitoring:Baseline: 135 bpm, Variability: Good {> 6 bpm), Accelerations: Reactive and Decelerations: Absent Tocometer: flat  Labs:  Results for orders placed or performed during the hospital encounter of 11/13/19 (from the past 24 hour(s))  CBC   Collection Time: 11/13/19 10:45 AM  Result Value Ref Range   WBC 11.9 (H) 4.0 - 10.5 K/uL   RBC 3.30 (L) 3.87 - 5.11 MIL/uL   Hemoglobin 9.4 (L) 12.0 - 15.0 g/dL   HCT 82.9 (L) 93.7 - 16.9 %   MCV 85.2 80.0 - 100.0 fL   MCH 28.5 26.0 - 34.0 pg   MCHC 33.5 30.0 - 36.0 g/dL   RDW 67.8 (H) 93.8 - 10.1 %   Platelets 191 150 - 400 K/uL   nRBC 0.2 0.0 - 0.2 %  Comprehensive metabolic panel   Collection Time: 11/13/19 10:45 AM  Result Value Ref Range   Sodium 135 135 - 145 mmol/L   Potassium 3.2 (L) 3.5 - 5.1 mmol/L   Chloride 98 98 - 111 mmol/L   CO2 25 22 - 32 mmol/L   Glucose, Bld 101 (H) 70 - 99 mg/dL   BUN <5 (L) 6 - 20 mg/dL   Creatinine, Ser 7.51 0.44 - 1.00 mg/dL   Calcium 8.9 8.9 - 02.5 mg/dL   Total Protein 6.5 6.5 - 8.1 g/dL   Albumin 3.0 (L) 3.5 - 5.0 g/dL   AST 31 15 - 41 U/L   ALT 29 0 - 44 U/L   Alkaline Phosphatase 72 38 - 126 U/L   Total Bilirubin 0.7 0.3 - 1.2 mg/dL   GFR calc non Af Amer >60 >60 mL/min   GFR calc Af Amer >60 >60 mL/min   Anion gap 12 5 - 15  Amnisure rupture of membrane (rom)not at [facility]   Collection Time: 11/13/19 10:53 AM  Result Value Ref Range   Amnisure ROM NEGATIVE   Urinalysis, Routine w reflex microscopic   Collection Time: 11/13/19  3:21 PM  Result Value Ref Range   Color,  Urine YELLOW YELLOW   APPearance HAZY (A) CLEAR   Specific Gravity, Urine 1.014 1.005 - 1.030   pH 7.0 5.0 - 8.0   Glucose, UA NEGATIVE NEGATIVE mg/dL   Hgb urine dipstick NEGATIVE NEGATIVE   Bilirubin Urine NEGATIVE NEGATIVE   Ketones, ur 80 (A) NEGATIVE mg/dL   Protein, ur NEGATIVE NEGATIVE mg/dL   Nitrite NEGATIVE NEGATIVE   Leukocytes,Ua NEGATIVE NEGATIVE  Wet prep, genital   Collection Time: 11/13/19  3:35 PM  Result Value Ref Range   Yeast Wet Prep HPF POC NONE SEEN NONE SEEN   Trich, Wet Prep NONE SEEN NONE SEEN   Clue Cells Wet Prep HPF POC NONE SEEN NONE SEEN   WBC, Wet Prep HPF POC FEW (A) NONE SEEN   Sperm NONE SEEN     Imaging Studies: No results found.   Assessment and Plan: 1. Hyperemesis affecting pregnancy, antepartum   2. Cannabis hyperemesis syndrome concurrent with and due to cannabis abuse ([facility])   3. [redacted] weeks gestation of pregnancy    -admit to obs per Dr. Adrian Blackwater for management of continued n/v   Judeth Horn, NP 11/13/2019 6:11 PM

## 2019-11-14 DIAGNOSIS — O98313 Other infections with a predominantly sexual mode of transmission complicating pregnancy, third trimester: Secondary | ICD-10-CM | POA: Diagnosis present

## 2019-11-14 DIAGNOSIS — O210 Mild hyperemesis gravidarum: Secondary | ICD-10-CM | POA: Diagnosis present

## 2019-11-14 DIAGNOSIS — O34219 Maternal care for unspecified type scar from previous cesarean delivery: Secondary | ICD-10-CM | POA: Diagnosis present

## 2019-11-14 DIAGNOSIS — Z20828 Contact with and (suspected) exposure to other viral communicable diseases: Secondary | ICD-10-CM | POA: Diagnosis present

## 2019-11-14 DIAGNOSIS — O0933 Supervision of pregnancy with insufficient antenatal care, third trimester: Secondary | ICD-10-CM | POA: Diagnosis not present

## 2019-11-14 DIAGNOSIS — Z3A32 32 weeks gestation of pregnancy: Secondary | ICD-10-CM | POA: Diagnosis not present

## 2019-11-14 DIAGNOSIS — F12188 Cannabis abuse with other cannabis-induced disorder: Secondary | ICD-10-CM | POA: Diagnosis present

## 2019-11-14 DIAGNOSIS — O99323 Drug use complicating pregnancy, third trimester: Secondary | ICD-10-CM | POA: Diagnosis present

## 2019-11-14 DIAGNOSIS — A5901 Trichomonal vulvovaginitis: Secondary | ICD-10-CM | POA: Diagnosis present

## 2019-11-14 DIAGNOSIS — O99013 Anemia complicating pregnancy, third trimester: Secondary | ICD-10-CM | POA: Diagnosis present

## 2019-11-14 DIAGNOSIS — Z3A31 31 weeks gestation of pregnancy: Secondary | ICD-10-CM | POA: Diagnosis not present

## 2019-11-14 DIAGNOSIS — D649 Anemia, unspecified: Secondary | ICD-10-CM | POA: Diagnosis present

## 2019-11-14 LAB — GC/CHLAMYDIA PROBE AMP (~~LOC~~) NOT AT ARMC
Chlamydia: NEGATIVE
Comment: NEGATIVE
Comment: NORMAL
Neisseria Gonorrhea: NEGATIVE

## 2019-11-14 LAB — AMNISURE RUPTURE OF MEMBRANE (ROM) NOT AT ARMC: Amnisure ROM: NEGATIVE

## 2019-11-14 NOTE — Progress Notes (Signed)
Pt refused bedtime NST  d/t being "uncomfortable." Pt denies ctx or vb at this time. RN explained importance of monitoring baby, but pt still refused to be monitored.

## 2019-11-14 NOTE — Progress Notes (Signed)
In to speak with patient at her request, she wanted to talk about her contractions. She has not eaten yet, but ordered lunch. States she is still feeling contractions and she is worried about them. After much discussion, patient is feeling cramping and pressure when she has to void and not contractions. Reviewed this is quite common in pregnancy and provided reassurance. UA negative yesterday but with symptoms, will send urine culture.  Encouraged her to attempt PO, she is agreeable to plan.   Feliz Beam, M.D. Attending Center for [employer] farm manager)

## 2019-11-14 NOTE — Progress Notes (Signed)
[place] PROGRESS NOTE  Julie Coleman is a 28 y.o. [id number] at [redacted]w[redacted]d who is admitted for hyperemesis.  Estimated Date of Delivery: 01/12/20 Fetal presentation is unsure.  Length of Stay:  0 Days. Admitted 11/13/2019  Subjective: Patient reports she is feeling better this am. Able to tolerate liquids, has not attempted solids yet. Patient reports normal fetal movement.  She reports some contractions and pressure, denies bleeding and leaking of fluid per vagina.  Vitals:  Blood pressure 119/67, pulse 67, temperature 97.8 F (36.6 C), temperature source Oral, resp. rate 18, height 5\' 7"  (1.702 m), weight 77.9 kg, last menstrual period 04/07/2019, SpO2 100 %. Physical Examination: CONSTITUTIONAL: Well-developed, well-nourished female in no acute distress.  HENT:  Normocephalic, atraumatic, External right and left ear normal. Oropharynx is clear and moist EYES: Conjunctivae and EOM are normal. Pupils are equal, round, and reactive to light. No scleral icterus.  NECK: Normal range of motion, supple, no masses. SKIN: Skin is warm and dry. No rash noted. Not diaphoretic. No erythema. No pallor. [place]: Alert and oriented to person, place, and time. Normal reflexes, muscle tone coordination. No cranial nerve deficit noted. PSYCHIATRIC: Normal mood and affect. Normal behavior. Normal judgment and thought content. CARDIOVASCULAR: Normal heart rate noted RESPIRATORY: Effort normal, no problems with respiration noted MUSCULOSKELETAL: Normal range of motion. No edema and no tenderness. ABDOMEN: Soft, nontender, nondistended, gravid. CERVIX: deferred  Fetal monitoring: FHR: 120 bpm, Variability: moderate, Accelerations: Present, Decelerations: Absent  Uterine activity: no contractions per hour  Results for orders placed or performed during the hospital encounter of 11/13/19 (from the past 48 hour(s))  CBC     Status: Abnormal   Collection Time: 11/13/19 10:45 AM  Result Value Ref  Range   WBC 11.9 (H) 4.0 - 10.5 K/uL   RBC 3.30 (L) 3.87 - 5.11 MIL/uL   Hemoglobin 9.4 (L) 12.0 - 15.0 g/dL   HCT 28.1 (L) 36.0 - 46.0 %   MCV 85.2 80.0 - 100.0 fL   MCH 28.5 26.0 - 34.0 pg   MCHC 33.5 30.0 - 36.0 g/dL   RDW 18.8 (H) 11.5 - 15.5 %   Platelets 191 150 - 400 K/uL   nRBC 0.2 0.0 - 0.2 %    Comment: Performed at [facility], [address], [place], [place] [address]  Comprehensive metabolic panel     Status: Abnormal   Collection Time: 11/13/19 10:45 AM  Result Value Ref Range   Sodium 135 135 - 145 mmol/L   Potassium 3.2 (L) 3.5 - 5.1 mmol/L   Chloride 98 98 - 111 mmol/L   CO2 25 22 - 32 mmol/L   Glucose, Bld 101 (H) 70 - 99 mg/dL   BUN <5 (L) 6 - 20 mg/dL   Creatinine, Ser 0.66 0.44 - 1.00 mg/dL   Calcium 8.9 8.9 - 10.3 mg/dL   Total Protein 6.5 6.5 - 8.1 g/dL   Albumin 3.0 (L) 3.5 - 5.0 g/dL   AST 31 15 - 41 U/L   ALT 29 0 - 44 U/L   Alkaline Phosphatase 72 38 - 126 U/L   Total Bilirubin 0.7 0.3 - 1.2 mg/dL   GFR calc non Af Amer >60 >60 mL/min   GFR calc Af Amer >60 >60 mL/min   Anion gap 12 5 - 15    Comment: Performed at [facility], [place] [address], [place], [place] [address]  Amnisure rupture of membrane (rom)not at [facility]  Status: None   Collection Time: 11/13/19 10:53 AM  Result Value Ref Range   Amnisure ROM NEGATIVE     Comment: Performed at [facility], [address] [place], [place] [address]  Urinalysis, Routine w reflex microscopic     Status: Abnormal   Collection Time: 11/13/19  3:21 PM  Result Value Ref Range   Color, Urine YELLOW YELLOW   APPearance HAZY (A) CLEAR   Specific Gravity, Urine 1.014 1.005 - 1.030   pH 7.0 5.0 - 8.0   Glucose, UA NEGATIVE NEGATIVE mg/dL   Hgb urine dipstick NEGATIVE NEGATIVE   Bilirubin Urine NEGATIVE NEGATIVE   Ketones, ur 80 (A) NEGATIVE mg/dL   Protein, ur NEGATIVE NEGATIVE mg/dL   Nitrite NEGATIVE NEGATIVE   Leukocytes,Ua NEGATIVE NEGATIVE    Comment: Performed at [facility], [address] [place], [place] [address]  Wet prep, genital     Status: Abnormal   Collection Time: 11/13/19  3:35 PM  Result Value Ref Range   Yeast Wet Prep HPF POC NONE SEEN NONE SEEN   Trich, Wet Prep NONE SEEN NONE SEEN   Clue Cells Wet Prep HPF POC NONE SEEN NONE SEEN   WBC, Wet Prep HPF POC FEW (A) NONE SEEN   Sperm NONE SEEN     Comment: Performed at [facility], [address], [place], [place] [address]  Type and screen [facility]     Status: None   Collection Time: 11/13/19  7:05 PM  Result Value Ref Range   ABO/RH(D) O POS    Antibody Screen NEG    Sample Expiration      11/16/2019,2359 Performed at [facility], [address], [place], [place] [address]     I have reviewed the patient's current medications.  ASSESSMENT: Active Problems:   Hyperemesis affecting pregnancy, antepartum   PLAN: Patient to attempt solids this am, possible discharge home if continues to improve No contractions noted on monitor Cont current anti-emetics   Continue routine antenatal care.   Baldemar Lenis, M.D. Attending Center for [employer] (Faculty Practice)  11/14/2019 11:41 AM

## 2019-11-14 NOTE — Progress Notes (Signed)
Faculty Note  Back in to see patient, reports she had mashed potatoes and mac and cheese for dinner, complains that the meat made her feel nauseous. Encouraged her to continue with bland diet. She is still feeling nauseous but overall improved from a hyperemesis standpoint. Now complaining of leaking fluid, states she feels "warm liquid" coming out prior to voiding and that she has cramping with voiding, feels like she needs to "push" and that this is what happened last time they "popped her water." Asked patient to clarify, she is having discharge prior to voiding and feeling cramping with voiding.   BP 119/75 (BP Location: Right Arm)   Pulse 81   Temp 98.2 F (36.8 C) (Oral)   Resp 18   Ht _0  (1.702 m)   Wt 77.9 kg   LMP 04/07/2019   SpO2 99%   BMI 26.90 kg/m   Gen: alert, oriented, no contractions or cramping while in room SSE: white discharge in vagina, no pooling SVE: closed/50/03  Fern: negative  No rupture of membranes. Will ensure patient is keeping down solids overnight and plan for discharge in am. She is agreeable to this plan.   Feliz Beam, M.D. Attending Center for [employer] Fish farm manager)

## 2019-11-15 LAB — CULTURE, OB URINE: Culture: NO GROWTH

## 2019-11-15 MED ORDER — DIPHENHYDRAMINE HCL 25 MG PO CAPS: 25.0000 mg | ORAL_CAPSULE | Freq: Four times a day (QID) | ORAL | 0 refills | Status: DC | PRN

## 2019-11-15 MED ORDER — PANTOPRAZOLE SODIUM 40 MG PO TBEC: 40.0000 mg | DELAYED_RELEASE_TABLET | Freq: Two times a day (BID) | ORAL | 2 refills | Status: DC

## 2019-11-15 MED ORDER — ONDANSETRON 4 MG PO TBDP: 4.0000 mg | ORAL_TABLET | Freq: Four times a day (QID) | ORAL | 2 refills | Status: DC

## 2019-11-15 NOTE — Discharge Summary (Signed)
Antenatal Physician Discharge Summary  Patient ID: Julie Coleman MRN: [id number] DOB/AGE: 08-08-1991 28 y.o.  Admit date: 11/13/2019 Discharge date: 11/15/2019  Admission Diagnoses: hyperemesis gravidarum, contractions  Discharge Diagnoses:  Active Problems:   Trichomonal vaginitis during pregnancy   Anemia affecting pregnancy   Nausea and vomiting in pregnancy   Cannabis hyperemesis syndrome concurrent with and due to cannabis abuse (HCC)   Hyperemesis affecting pregnancy, antepartum   Prenatal Procedures: NST  Hospital Course:  This is a 28 y.o. [id number] with IUP at [redacted]w[redacted]d admitted for uncontrolled nausea/vomiting. Patient improved with regular administration of anti-emetics and by HD#2 was tolerating small amounts of PO intake. She continued to complain of pressure and leaking, re-exam of cervix showed no rupture of membranes and no change in cervical exam.   She was observed, fetal heart rate monitoring remained reassuring, and she had no signs/symptoms of progressing preterm labor or other maternal-fetal concerns. She improved and was tolerating PO well with significantly improved nausea by HD#3 and was discharged home.  She was deemed stable for discharge to home with outpatient follow up. Encouraged to f/u in office for prenatal care, has appointment for 11/21/19 at [facility] office.  Discharge Exam: Temp:  [97.8 F (36.6 C)-98.4 F (36.9 C)] 98.4 F (36.9 C) (11/26 0512) Pulse Rate:  [67-83] 80 (11/26 0512) Resp:  [18] 18 (11/26 0512) BP: (113-119)/(60-75) 113/60 (11/26 0512) SpO2:  [99 %-100 %] 100 % (11/26 0512) Physical Examination: CONSTITUTIONAL: Well-developed, well-nourished female in no acute distress.  HENT:  Normocephalic, atraumatic, External right and left ear normal. Oropharynx is clear and moist EYES: Conjunctivae and EOM are normal. Pupils are equal, round, and reactive to light. No scleral icterus.  NECK: Normal range of motion, supple, no masses SKIN: Skin is  warm and dry. No rash noted. Not diaphoretic. No erythema. No pallor. NEUROLGIC: Alert and oriented to person, place, and time. Normal reflexes, muscle tone coordination. No cranial nerve deficit noted. PSYCHIATRIC: Normal mood and affect. Normal behavior. Normal judgment and thought content. CARDIOVASCULAR: Normal heart rate noted RESPIRATORY: Effort normal, no problems with respiration noted MUSCULOSKELETAL: Normal range of motion. No edema and no tenderness. 2+ distal pulses. ABDOMEN: Soft, nontender, nondistended, gravid. CERVIX: Dilation: Closed Effacement (%): 50 Cervical Position: [place]: -3 Exam by:: Leroy Libman MD   Fetal monitoring: refused monitoring last night, will have done this am   Significant Diagnostic Studies:  Results for orders placed or performed during the hospital encounter of 11/13/19 (from the past 168 hour(s))  CBC   Collection Time: 11/13/19 10:45 AM  Result Value Ref Range   WBC 11.9 (H) 4.0 - 10.5 K/uL   RBC 3.30 (L) 3.87 - 5.11 MIL/uL   Hemoglobin 9.4 (L) 12.0 - 15.0 g/dL   HCT 13.0 (L) 86.5 - 78.4 %   MCV 85.2 80.0 - 100.0 fL   MCH 28.5 26.0 - 34.0 pg   MCHC 33.5 30.0 - 36.0 g/dL   RDW 69.6 (H) 29.5 - 28.4 %   Platelets 191 150 - 400 K/uL   nRBC 0.2 0.0 - 0.2 %  Comprehensive metabolic panel   Collection Time: 11/13/19 10:45 AM  Result Value Ref Range   Sodium 135 135 - 145 mmol/L   Potassium 3.2 (L) 3.5 - 5.1 mmol/L   Chloride 98 98 - 111 mmol/L   CO2 25 22 - 32 mmol/L   Glucose, Bld 101 (H) 70 - 99 mg/dL   BUN <5 (L) 6 - 20 mg/dL   Creatinine,  Ser 0.66 0.44 - 1.00 mg/dL   Calcium 8.9 8.9 - 76.7 mg/dL   Total Protein 6.5 6.5 - 8.1 g/dL   Albumin 3.0 (L) 3.5 - 5.0 g/dL   AST 31 15 - 41 U/L   ALT 29 0 - 44 U/L   Alkaline Phosphatase 72 38 - 126 U/L   Total Bilirubin 0.7 0.3 - 1.2 mg/dL   GFR calc non Af Amer >60 >60 mL/min   GFR calc Af Amer >60 >60 mL/min   Anion gap 12 5 - 15  Amnisure rupture of membrane (rom)not at [facility]    Collection Time: 11/13/19 10:53 AM  Result Value Ref Range   Amnisure ROM NEGATIVE   GC/Chlamydia probe amp ([place])not at [facility]   Collection Time: 11/13/19  3:07 PM  Result Value Ref Range   Neisseria Gonorrhea Negative    Chlamydia Negative    Comment Normal Reference [place] Chlamydia - Negative    Comment      Normal Reference Range Neisseria Gonorrhea - Negative  Urinalysis, Routine w reflex microscopic   Collection Time: 11/13/19  3:21 PM  Result Value Ref Range   Color, Urine YELLOW YELLOW   APPearance HAZY (A) CLEAR   Specific Gravity, Urine 1.014 1.005 - 1.030   pH 7.0 5.0 - 8.0   Glucose, UA NEGATIVE NEGATIVE mg/dL   Hgb urine dipstick NEGATIVE NEGATIVE   Bilirubin Urine NEGATIVE NEGATIVE   Ketones, ur 80 (A) NEGATIVE mg/dL   Protein, ur NEGATIVE NEGATIVE mg/dL   Nitrite NEGATIVE NEGATIVE   Leukocytes,Ua NEGATIVE NEGATIVE  Wet prep, genital   Collection Time: 11/13/19  3:35 PM  Result Value Ref Range   Yeast Wet Prep HPF POC NONE SEEN NONE SEEN   Trich, Wet Prep NONE SEEN NONE SEEN   Clue Cells Wet Prep HPF POC NONE SEEN NONE SEEN   WBC, Wet Prep HPF POC FEW (A) NONE SEEN   Sperm NONE SEEN   Type and screen [facility]   Collection Time: 11/13/19  7:05 PM  Result Value Ref Range   ABO/RH(D) O POS    Antibody Screen NEG    Sample Expiration      11/16/2019,2359 Performed at [facility], [address], [place], [place] [address]   Amnisure rupture of membrane (rom)not at [facility]   Collection Time: 11/14/19  7:35 PM  Result Value Ref Range   Amnisure ROM NEGATIVE   Results for orders placed or performed during the hospital encounter of 11/08/19 (from the past 168 hour(s))  CBC with Differential   Collection Time: 11/08/19  3:48 PM  Result Value Ref Range   WBC 11.3 (H) 4.0 - 10.5 K/uL   RBC 3.41 (L) 3.87 - 5.11 MIL/uL   Hemoglobin 9.4 (L) 12.0 - 15.0 g/dL   HCT 79.0 (L) 24.0 - 97.3 %   MCV 81.8 80.0 - 100.0 fL   MCH 27.6 26.0 -  34.0 pg   MCHC 33.7 30.0 - 36.0 g/dL   RDW 53.2 (H) 99.2 - 42.6 %   Platelets 209 150 - 400 K/uL   nRBC 0.4 (H) 0.0 - 0.2 %   Neutrophils Relative % 86 %   Neutro Abs 9.6 (H) 1.7 - 7.7 K/uL   Lymphocytes Relative 9 %   Lymphs Abs 1.1 0.7 - 4.0 K/uL   Monocytes Relative 4 %   Monocytes Absolute 0.5 0.1 - 1.0 K/uL   Eosinophils Relative 0 %   Eosinophils Absolute 0.0 0.0 -  0.5 K/uL   Basophils Relative 0 %   Basophils Absolute 0.0 0.0 - 0.1 K/uL   Immature Granulocytes 1 %   Abs Immature Granulocytes 0.11 (H) 0.00 - 0.07 K/uL  CMET STAT   Collection Time: 11/08/19  3:48 PM  Result Value Ref Range   Sodium 139 135 - 145 mmol/L   Potassium 3.3 (L) 3.5 - 5.1 mmol/L   Chloride 97 (L) 98 - 111 mmol/L   CO2 25 22 - 32 mmol/L   Glucose, Bld 101 (H) 70 - 99 mg/dL   BUN <5 (L) 6 - 20 mg/dL   Creatinine, Ser 8.290.79 0.44 - 1.00 mg/dL   Calcium 9.4 8.9 - 56.210.3 mg/dL   Total Protein 7.5 6.5 - 8.1 g/dL   Albumin 3.5 3.5 - 5.0 g/dL   AST 37 15 - 41 U/L   ALT 27 0 - 44 U/L   Alkaline Phosphatase 71 38 - 126 U/L   Total Bilirubin 0.7 0.3 - 1.2 mg/dL   GFR calc non Af Amer >60 >60 mL/min   GFR calc Af Amer >60 >60 mL/min   Anion gap 17 (H) 5 - 15  Urinalysis, Routine w reflex microscopic   Collection Time: 11/08/19  6:55 PM  Result Value Ref Range   Color, Urine AMBER (A) YELLOW   APPearance CLEAR CLEAR   Specific Gravity, Urine 1.017 1.005 - 1.030   pH 7.0 5.0 - 8.0   Glucose, UA 50 (A) NEGATIVE mg/dL   Hgb urine dipstick NEGATIVE NEGATIVE   Bilirubin Urine NEGATIVE NEGATIVE   Ketones, ur 80 (A) NEGATIVE mg/dL   Protein, ur 30 (A) NEGATIVE mg/dL   Nitrite NEGATIVE NEGATIVE   Leukocytes,Ua NEGATIVE NEGATIVE   RBC / HPF 0-5 0 - 5 RBC/hpf   WBC, UA 0-5 0 - 5 WBC/hpf   Bacteria, UA RARE (A) NONE SEEN   Mucus PRESENT   Urine rapid drug screen (hosp performed)   Collection Time: 11/08/19  6:55 PM  Result Value Ref Range   Opiates NONE DETECTED NONE DETECTED   Cocaine NONE DETECTED NONE  DETECTED   Benzodiazepines NONE DETECTED NONE DETECTED   Amphetamines NONE DETECTED NONE DETECTED   Tetrahydrocannabinol POSITIVE (A) NONE DETECTED   Barbiturates NONE DETECTED NONE DETECTED  SARS CORONAVIRUS 2 (TAT 6-24 HRS) Nasopharyngeal Nasopharyngeal Swab   Collection Time: 11/08/19  8:17 PM   Specimen: Nasopharyngeal Swab  Result Value Ref Range   SARS Coronavirus 2 NEGATIVE NEGATIVE  Type and screen [facility]   Collection Time: 11/08/19  8:53 PM  Result Value Ref Range   ABO/RH(D) O POS    Antibody Screen NEG    Sample Expiration      11/11/2019,2359 Performed at [facility], [address] [place], [place] [address]     Discharge Condition: Stable  Disposition: Discharge disposition: 01-Home or Self Care      Discharge Instructions    Notify physician for a general feeling that "something is not right"   Complete by: As directed    Notify physician for increase or change in vaginal discharge   Complete by: As directed    Notify physician for intestinal cramps, with or without diarrhea, sometimes described as "gas pain"   Complete by: As directed    Notify physician for leaking of fluid   Complete by: As directed    Notify physician for low, dull backache, unrelieved by heat or Tylenol   Complete by: As directed  Notify physician for menstrual like cramps   Complete by: As directed    Notify physician for pelvic pressure   Complete by: As directed    Notify physician for uterine contractions.  These may be painless and feel like the uterus is tightening or the baby is  "balling up"   Complete by: As directed    Notify physician for vaginal bleeding   Complete by: As directed    PRETERM LABOR:  Includes any of the follwing symptoms that occur between 20 - [redacted] weeks gestation.  If these symptoms are not stopped, preterm labor can result in preterm delivery, placing your baby at risk   Complete by: As directed      Allergies as of  11/15/2019      Reactions   Phenergan [promethazine Hcl]    Tardive dyskinesia       Medication List    STOP taking these medications   diphenhydrAMINE 25 MG tablet Commonly known as: BENADRYL Replaced by: diphenhydrAMINE 25 mg capsule   iron polysaccharides 150 MG capsule Commonly known as: NIFEREX     TAKE these medications   diphenhydrAMINE 25 mg capsule Commonly known as: BENADRYL Take 1 capsule (25 mg total) by mouth every 6 (six) hours as needed (nausea). Replaces: diphenhydrAMINE 25 MG tablet   glycopyrrolate 2 MG tablet Commonly known as: ROBINUL Take 1 tablet (2 mg total) by mouth 3 (three) times daily as needed.   metoCLOPramide 10 MG tablet Commonly known as: REGLAN Take 1 tablet (10 mg total) by mouth every 6 (six) hours as needed for nausea or vomiting.   ondansetron 4 MG disintegrating tablet Commonly known as: Zofran ODT Take 1 tablet (4 mg total) by mouth every 6 (six) hours. What changed:   when to take this  reasons to take this   pantoprazole 40 MG tablet Commonly known as: Protonix Take 1 tablet (40 mg total) by mouth 2 (two) times daily. What changed: when to take this   PrePLUS 27-1 MG Tabs Take 1 tablet by mouth daily.   scopolamine 1 MG/3DAYS Commonly known as: TRANSDERM-SCOP Place 1 patch (1.5 mg total) onto the skin every 3 (three) days.      Follow-up [place] for [facility]. Go on 11/21/2019.   Specialty: Obstetrics and Gynecology Why: at 1:30 pm Contact information: [address] [place], [place] [id number] [place] [id number] [phone]          Signed: Feliz Beam, M.D. Attending Center for [employer] (Faculty Practice)  11/15/2019, 8:30 AM

## 2019-11-15 NOTE — Discharge Instructions (Signed)
Morning Sickness  Morning sickness is when you feel sick to your stomach (nauseous) during pregnancy. You may feel sick to your stomach and throw up (vomit). You may feel sick in the morning, but you can feel this way at any time of day. Some women feel very sick to their stomach and cannot stop throwing up (hyperemesis gravidarum). Follow these instructions at home: Medicines  Take over-the-counter and prescription medicines only as told by your doctor. Do not take any medicines until you talk with your doctor about them first.  Taking multivitamins before getting pregnant can stop or lessen the harshness of morning sickness. Eating and drinking  Eat dry toast or crackers before getting out of bed.  Eat 5 or 6 small meals a day.  Eat dry and bland foods like rice and baked potatoes.  Do not eat greasy, fatty, or spicy foods.  Have someone cook for you if the smell of food causes you to feel sick or throw up.  If you feel sick to your stomach after taking prenatal vitamins, take them at night or with a snack.  Eat protein when you need a snack. Nuts, yogurt, and cheese are good choices.  Drink fluids throughout the day.  Try ginger ale made with real ginger, ginger tea made from fresh grated ginger, or ginger candies. General instructions  Do not use any products that have nicotine or tobacco in them, such as cigarettes and e-cigarettes. If you need help quitting, ask your doctor.  Use an air purifier to keep the air in your house free of smells.  Get lots of fresh air.  Try to avoid smells that make you feel sick.  Try: ? Wearing a bracelet that is used for seasickness (acupressure wristband). ? Going to a doctor who puts thin needles into certain body points (acupuncture) to improve how you feel. Contact a doctor if:  You need medicine to feel better.  You feel dizzy or light-headed.  You are losing weight. Get help right away if:  You feel very sick to your  stomach and cannot stop throwing up.  You pass out (faint).  You have very bad pain in your belly. Summary  Morning sickness is when you feel sick to your stomach (nauseous) during pregnancy.  You may feel sick in the morning, but you can feel this way at any time of day.  Making some changes to what you eat may help your symptoms go away. This information is not intended to replace advice given to you by your health care provider. Make sure you discuss any questions you have with your health care provider. Document Released: 01/13/2005 Document Revised: 11/18/2017 Document Reviewed: 01/06/2017 Elsevier Patient Education  2020 Elsevier Inc.  

## 2019-11-15 NOTE — Progress Notes (Signed)
D/c home  Teaching complete  Follow up and meds discussed with pt

## 2019-11-17 ENCOUNTER — Inpatient Hospital Stay (EMERGENCY_DEPARTMENT_HOSPITAL)
Admission: AD | Admit: 2019-11-17 | Discharge: 2019-11-17 | Disposition: A | Discharge status: Home / Self Care | Payer: Medicaid Other | Source: Home / Self Care | Attending: Obstetrics and Gynecology | Admitting: Obstetrics and Gynecology

## 2019-11-17 ENCOUNTER — Inpatient Hospital Stay (HOSPITAL_COMMUNITY)
Admission: AD | Admit: 2019-11-17 | Discharge: 2019-11-17 | Disposition: A | Discharge status: Home / Self Care | Payer: Medicaid Other | Attending: Obstetrics and Gynecology | Admitting: Obstetrics and Gynecology

## 2019-11-17 ENCOUNTER — Other Ambulatory Visit: Payer: Self-pay

## 2019-11-17 ENCOUNTER — Encounter (HOSPITAL_COMMUNITY): Payer: Self-pay

## 2019-11-17 DIAGNOSIS — O210 Mild hyperemesis gravidarum: Secondary | ICD-10-CM | POA: Diagnosis not present

## 2019-11-17 DIAGNOSIS — O163 Unspecified maternal hypertension, third trimester: Secondary | ICD-10-CM | POA: Insufficient documentation

## 2019-11-17 DIAGNOSIS — K5900 Constipation, unspecified: Secondary | ICD-10-CM | POA: Diagnosis not present

## 2019-11-17 DIAGNOSIS — O99613 Diseases of the digestive system complicating pregnancy, third trimester: Secondary | ICD-10-CM | POA: Diagnosis not present

## 2019-11-17 DIAGNOSIS — J45909 Unspecified asthma, uncomplicated: Secondary | ICD-10-CM | POA: Insufficient documentation

## 2019-11-17 DIAGNOSIS — Z3A32 32 weeks gestation of pregnancy: Secondary | ICD-10-CM | POA: Insufficient documentation

## 2019-11-17 DIAGNOSIS — O219 Vomiting of pregnancy, unspecified: Secondary | ICD-10-CM

## 2019-11-17 DIAGNOSIS — O99513 Diseases of the respiratory system complicating pregnancy, third trimester: Secondary | ICD-10-CM | POA: Insufficient documentation

## 2019-11-17 DIAGNOSIS — O99013 Anemia complicating pregnancy, third trimester: Secondary | ICD-10-CM

## 2019-11-17 DIAGNOSIS — Z888 Allergy status to other drugs, medicaments and biological substances status: Secondary | ICD-10-CM | POA: Insufficient documentation

## 2019-11-17 DIAGNOSIS — O26893 Other specified pregnancy related conditions, third trimester: Secondary | ICD-10-CM | POA: Diagnosis not present

## 2019-11-17 DIAGNOSIS — Z283 Underimmunization status: Secondary | ICD-10-CM

## 2019-11-17 DIAGNOSIS — O212 Late vomiting of pregnancy: Secondary | ICD-10-CM | POA: Insufficient documentation

## 2019-11-17 DIAGNOSIS — A5901 Trichomonal vulvovaginitis: Secondary | ICD-10-CM

## 2019-11-17 DIAGNOSIS — R1030 Lower abdominal pain, unspecified: Secondary | ICD-10-CM | POA: Insufficient documentation

## 2019-11-17 DIAGNOSIS — O09899 Supervision of other high risk pregnancies, unspecified trimester: Secondary | ICD-10-CM

## 2019-11-17 DIAGNOSIS — Z79899 Other long term (current) drug therapy: Secondary | ICD-10-CM | POA: Insufficient documentation

## 2019-11-17 LAB — URINALYSIS, ROUTINE W REFLEX MICROSCOPIC
Bacteria, UA: NONE SEEN
Bilirubin Urine: NEGATIVE
Glucose, UA: NEGATIVE mg/dL
Hgb urine dipstick: NEGATIVE
Ketones, ur: NEGATIVE mg/dL
Leukocytes,Ua: NEGATIVE
Nitrite: NEGATIVE
Protein, ur: 30 mg/dL — AB
Specific Gravity, Urine: 1.018 (ref 1.005–1.030)
pH: 7 (ref 5.0–8.0)

## 2019-11-17 MED ORDER — DOCUSATE SODIUM 100 MG PO CAPS: 100.0000 mg | ORAL_CAPSULE | Freq: Two times a day (BID) | ORAL | 0 refills | Status: DC

## 2019-11-17 MED ORDER — ONDANSETRON 4 MG PO TBDP: 4.0000 mg | ORAL_TABLET | Freq: Four times a day (QID) | ORAL | 2 refills | Status: DC

## 2019-11-17 MED ORDER — METOCLOPRAMIDE HCL 10 MG PO TABS: 10.0000 mg | ORAL_TABLET | Freq: Four times a day (QID) | ORAL | 2 refills | Status: DC | PRN

## 2019-11-17 MED ORDER — GLYCOPYRROLATE 2 MG PO TABS: 2.0000 mg | ORAL_TABLET | Freq: Three times a day (TID) | ORAL | 3 refills | Status: DC | PRN

## 2019-11-17 MED ORDER — METOCLOPRAMIDE HCL 10 MG PO TABS
10.0000 mg | ORAL_TABLET | Freq: Once | ORAL | Status: AC
  Administered 2019-11-17: 10 mg via ORAL
  Filled 2019-11-17: qty 1

## 2019-11-17 MED ORDER — SCOPOLAMINE 1 MG/3DAYS TD PT72
1.0000 | MEDICATED_PATCH | Freq: Once | TRANSDERMAL | Status: DC
  Administered 2019-11-17: 1.5 mg via TRANSDERMAL
  Filled 2019-11-17 (×2): qty 1

## 2019-11-17 MED ORDER — BENZOCAINE-MENTHOL 20-0.5 % EX AERO
1.0000 "application " | INHALATION_SPRAY | Freq: Four times a day (QID) | CUTANEOUS | Status: DC | PRN
  Filled 2019-11-17 (×2): qty 56

## 2019-11-17 MED ORDER — PANTOPRAZOLE SODIUM 40 MG PO TBEC: 40.0000 mg | DELAYED_RELEASE_TABLET | Freq: Two times a day (BID) | ORAL | 2 refills | Status: DC

## 2019-11-17 MED ORDER — PANTOPRAZOLE SODIUM 40 MG PO TBEC
40.0000 mg | DELAYED_RELEASE_TABLET | Freq: Once | ORAL | Status: AC
  Administered 2019-11-17: 40 mg via ORAL
  Filled 2019-11-17: qty 1

## 2019-11-17 MED ORDER — SCOPOLAMINE 1 MG/3DAYS TD PT72: 1.0000 | MEDICATED_PATCH | TRANSDERMAL | 12 refills | Status: DC

## 2019-11-17 MED ORDER — GLYCOPYRROLATE 1 MG PO TABS
2.0000 mg | ORAL_TABLET | Freq: Once | ORAL | Status: AC
  Administered 2019-11-17: 2 mg via ORAL
  Filled 2019-11-17: qty 2

## 2019-11-17 MED ORDER — ONDANSETRON 4 MG PO TBDP
8.0000 mg | ORAL_TABLET | Freq: Once | ORAL | Status: AC
  Administered 2019-11-17: 8 mg via ORAL
  Filled 2019-11-17: qty 2

## 2019-11-17 NOTE — MAU Note (Signed)
RN entered room to [employer]. Patch for c/o nausea.  Pt. Asking for nausea meds here at hospital.  Rn explained to pt that scop. Patch is for nausea and vomiting.  Pt states "that won't work" and requests to talk with provider again.  NP notified of pt requests.

## 2019-11-17 NOTE — MAU Note (Signed)
Rn attempted to dc pt home, pt requested to speak to someone other than the providers she spoke with already.  RN called house coverage RN and notified her of situation.  House coverage at bedside to talk with pt at 12:23pm.

## 2019-11-17 NOTE — Discharge Instructions (Signed)
You have constipation which is hard stools that are difficult to pass. It is important to have regular bowel movements every 1-3 days that are soft and easy to pass. Hard stools increase your risk of hemorrhoids and are very uncomfortable.   To prevent constipation you can increase the amount of fiber in your diet. Examples of foods with fiber are leafy greens, whole grain breads, oatmeal and other grains.  It is also important to drink at least eight 8oz glass of water everyday.   If you have not has a bowel movement in 4-5 days you made need to clean out your bowel.  This will have establish normal movement through your bowel.    Miralax Clean out  Take 8 capfuls of miralax in 64 oz of gatorade. You can use any fluid that appeals to you (gatorade, water, juice)  Continue to drink at least eight 8 oz glasses of water throughout the day  You can repeat with another 8 capfuls of miralax in 64 oz of gatorade if you are not having a large amount of stools  You will need to be at home and close to a bathroom for about 8 hours when you do the above as you may need to go to the bathroom frequently.   After you are cleaned out: - Start Colace100mg  twice daily - Start Miralax once daily - Start a daily fiber supplement like metamucil or citrucel - You can safely use enemas in pregnancy  - if you are having diarrhea you can reduce to Colace once a day or miralax every other day or a 1/2 capful daily.        Constipation, Adult Constipation is when a person has fewer bowel movements in a week than normal, has difficulty having a bowel movement, or has stools that are dry, hard, or larger than normal. Constipation may be caused by an underlying condition. It may become worse with age if a person takes certain medicines and does not take in enough fluids. Follow these instructions at home: Eating and drinking   Eat foods that have a lot of fiber, such as fresh fruits and vegetables, whole  grains, and beans.  Limit foods that are high in fat, low in fiber, or overly processed, such as french fries, hamburgers, cookies, candies, and soda.  Drink enough fluid to keep your urine clear or pale yellow. General instructions  Exercise regularly or as told by your health care provider.  Go to the restroom when you have the urge to go. Do not hold it in.  Take over-the-counter and prescription medicines only as told by your health care provider. These include any fiber supplements.  Practice pelvic floor retraining exercises, such as deep breathing while relaxing the lower abdomen and pelvic floor relaxation during bowel movements.  Watch your condition for any changes.  Keep all follow-up visits as told by your health care provider. This is important. Contact a health care provider if:  You have pain that gets worse.  You have a fever.  You do not have a bowel movement after 4 days.  You vomit.  You are not hungry.  You lose weight.  You are bleeding from the anus.  You have thin, pencil-like stools. Get help right away if:  You have a fever and your symptoms suddenly get worse.  You leak stool or have blood in your stool.  Your abdomen is bloated.  You have severe pain in your abdomen.  You feel dizzy or  you faint. This information is not intended to replace advice given to you by your health care provider. Make sure you discuss any questions you have with your health care provider. Document Released: 09/03/2004 Document Revised: 11/18/2017 Document Reviewed: 05/26/2016 Elsevier Patient Education  2020 [employer].

## 2019-11-17 NOTE — MAU Provider Note (Signed)
History     CSN: [id number]  Arrival date and time: 11/17/19 1330   First Provider Initiated Contact with Patient 11/17/19 1413      Chief Complaint  Patient presents with  . Emesis   Julie Coleman is a 28 y.o. [id number] at [redacted]w[redacted]d who receives care at [facility].  She presents today for Emesis.  She states she was leaving, from the hospital, and tried to get on the bus.  She states at that time she started having some vomiting and was told that she can not ride the bus if she is sick.  Patient states she returned back to the hospital for treatment.  She reports she received medication for vomiting, prior to discharge, "but that all came up."  She reports that she does not have any medications at home for nausea/vomiting and only has a prenatal vitamin.  However, patient later states that she just ran out of her medication, yesterday, and was not having any issues prior to this.  Patient states she is able to pick up her medication, but can not pay for them because she doesn't have an income right now.   Patient endorses fetal movement and denies vaginal concerns.  She states that she has not been able to "boo-boo" in one week and feels this is contributing to her discomfort.  However, patient declines an enema stating "I tried that already and it is too uncomfortable." Patient reports that she does not have an appetite.  Provider questions what patient would like to focus on during this visit today and she states "I want to stop throwing up."  Of note patient has not had an actual emesis episode, with provider at bedside, but is dry heaving and continues to spit.       OB History    Gravida  6   Para  3   Term  2   Preterm  1   AB  2   Living  3     SAB  1   TAB  1   Ectopic      Multiple      Live Births  3           Past Medical History:  Diagnosis Date  . Asthma    albuteral inhaler  . Gestational diabetes   . Hypertension     Past Surgical History:   Procedure Laterality Date  . CESAREAN SECTION    . CHOLECYSTECTOMY      No family history on file.  Social History   Tobacco Use  . Smoking status: Never Smoker  . Smokeless tobacco: Never Used  Substance Use Topics  . Alcohol use: Not Currently  . Drug use: Yes    Types: Marijuana    Comment: used yesterday, 11/16/19    Allergies:  Allergies  Allergen Reactions  . Phenergan [Promethazine Hcl]     Tardive dyskinesia     Medications Prior to Admission  Medication Sig Dispense Refill Last Dose  . diphenhydrAMINE (BENADRYL) 25 mg capsule Take 1 capsule (25 mg total) by mouth every 6 (six) hours as needed (nausea). 30 capsule 0   . docusate sodium (COLACE) 100 MG capsule Take 1 capsule (100 mg total) by mouth 2 (two) times daily. 60 capsule 0   . glycopyrrolate (ROBINUL) 2 MG tablet Take 1 tablet (2 mg total) by mouth 3 (three) times daily as needed. 30 tablet 3   . metoCLOPramide (REGLAN) 10 MG tablet Take 1 tablet (10 mg  total) by mouth every 6 (six) hours as needed for nausea or vomiting. 30 tablet 2   . ondansetron (ZOFRAN ODT) 4 MG disintegrating tablet Take 1 tablet (4 mg total) by mouth every 6 (six) hours. 30 tablet 2   . pantoprazole (PROTONIX) 40 MG tablet Take 1 tablet (40 mg total) by mouth 2 (two) times daily. 60 tablet 2   . Prenatal Vit-Fe Fumarate-FA (PREPLUS) 27-1 MG TABS Take 1 tablet by mouth daily. 30 tablet 13   . scopolamine (TRANSDERM-SCOP) 1 MG/3DAYS Place 1 patch (1.5 mg total) onto the skin every 3 (three) days. 10 patch 12     Review of Systems  Gastrointestinal: Positive for constipation, nausea and vomiting.   Physical Exam   Blood pressure 133/75, pulse (!) 107, temperature 98.1 F (36.7 C), temperature source Oral, resp. rate 20, last menstrual period 04/07/2019, SpO2 99 %.  Physical Exam  Constitutional: She is oriented to person, place, and time. She appears well-developed and well-nourished. She appears distressed (Mild with Dry  Heaving.).  HENT:  Head: Normocephalic and atraumatic.  Eyes: Conjunctivae are normal.  Neck: Normal range of motion.  Cardiovascular: Normal rate.  Respiratory: Effort normal.  Musculoskeletal: Normal range of motion.  Neurological: She is alert and oriented to person, place, and time.  Skin: Skin is dry.  Psychiatric: She has a normal mood and affect. Her behavior is normal.    MAU Course  Procedures Results for orders placed or performed during the hospital encounter of 11/17/19 (from the past 24 hour(s))  Urinalysis, Routine w reflex microscopic     Status: Abnormal   Collection Time: 11/17/19  2:40 PM  Result Value Ref Range   Color, Urine AMBER (A) YELLOW   APPearance CLEAR CLEAR   Specific Gravity, Urine 1.018 1.005 - 1.030   pH 7.0 5.0 - 8.0   Glucose, UA NEGATIVE NEGATIVE mg/dL   Hgb urine dipstick NEGATIVE NEGATIVE   Bilirubin Urine NEGATIVE NEGATIVE   Ketones, ur NEGATIVE NEGATIVE mg/dL   Protein, ur 30 (A) NEGATIVE mg/dL   Nitrite NEGATIVE NEGATIVE   Leukocytes,Ua NEGATIVE NEGATIVE   RBC / HPF 0-5 0 - 5 RBC/hpf   WBC, UA 6-10 0 - 5 WBC/hpf   Bacteria, UA NONE SEEN NONE SEEN   Squamous Epithelial / LPF 0-5 0 - 5   Mucus PRESENT     MDM Anti emetics  Assessment and Plan  28 year old [id number] SIUP at 332 weeks Vomiting  -Nurse reports patient refusing doppler.  -Discussed POC to include oral anti emetics and reassessment. -Offered and patient declines social work consult for transportation assistance.  Patient states she just needs to stop throwing up and she can take the bus. -Patient initially stating she is not agreeable with plan of oral anti emetics and states she needs an IV. -Patient instructed to leave a urine specimen for assessment of IV necessity and patient agreeable with POC. -Patient to wait in family gathering room at this time.  Cherre RobinsJessica L Margie Urbanowicz, MSN, CNM 11/17/2019, 2:13 PM   Reassessment (3:01 PM)  -Patient resting in family waiting  room. -Patient states she does not feel an improvement after Zofran dosing. However, patient accepts something to drink. -Patient given apple juice for small sips. -Patient questions what will happen next if medication continues to not work.  Informed that we will wait for urine to return and proceed from there. -Patient agreeable and without further questions.   Reassessment (3:37 PM)  -Urine returns without significant findings. -  In room to discuss with patient who is sleeping. -Patient reports continued nausea and continues to express desire for an IV. -Reiterated that urine not displaying signs of dehydration. -Patient further informed that oral medication is utilized, because this is what she will be managing her n/v with at home. -Provider discusses expectation of some nausea and vomiting during pregnancy and that the level that patient is currently experiencing can be managed in an outpatient setting. -Patient also informed that if she feels her home medications are not working well for her, she can discuss this at her appt on Wednesday. -Patient offered and accepts SW consult for getting a ride home as she feels she can't get on the bus with the continued nausea. -AC contacted, by nurse, who states she will provide patient with taxi voucher. -Discharged to home in stable condition.  Cherre Robins MSN, CNM Advanced Practice Provider, [facility]  -After discharge patient requesting all medications be sent to [facility]. -She also reports rectal pressure stating that she feels she now needs to have a bowel movement.  -Discussed bowel movement at home and self care of report hemorrhoid including warm compresses, sitz baths, and petroleum jelly as patient has no access to funds for medications. -Patient agreeable and without questions or concerns. -Given sandwich snack pack and taxi voucher.

## 2019-11-17 NOTE — MAU Note (Signed)
Julie Coleman is a 28 y.o. at [redacted]w[redacted]d here in MAU reporting: states she has vomited 2 times since she left. Denies pain, vaginal bleeding, and LOF.  Onset of complaint: ongoing  Pain score: 0/10  Vitals:   11/17/19 1351  BP: 133/75  Pulse: (!) 107  Resp: 20  Temp: 98.1 F (36.7 C)  SpO2: 99%     FHT: informed pt I would like to doppler FHT and patient states she cannot lean back in the chair at this time and is declining to be dopplered.   Lab orders placed from triage: none

## 2019-11-17 NOTE — MAU Note (Signed)
Pt  Reports to mau with c/o ctx q 5-10 since yesterday, pt also reports that she has not had a BM in about a week.  Pt complaining of pressure in her "bottom".  Pt reports good fetal movement and denies any LOF or vag bleeding.

## 2019-11-17 NOTE — MAU Provider Note (Signed)
Chief Complaint:  Abdominal Pain, Constipation, and Contractions   First Provider Initiated Contact with Patient 11/17/19 1006     HPI: Julie Coleman is a 28 y.o. [id number] at 3851w0d who presents to maternity admissions reporting abdominal pain, rectal pressure, & constipation. States she thinks the last BM she had was a week ago. Currently taking zofran for continued n/v of pregnancy. Has been taking stool softeners twice per day without relief. Also reports constant lower abdominal pain & rectal pressure.  Denies vaginal bleeding, dysuria, LOF, or fever/chills. Good fetal movement.   Location: abdomen & rectum Quality: pressure Severity: 9/10 in pain scale Duration: 3 days Timing: constant Modifying factors: nothing makes better or worse Associated signs and symptoms: constipation  Pregnancy Course: limited to no prenatal care. Admissions for HEG. Trich in pregnancy.   Past Medical History:  Diagnosis Date  . Asthma    albuteral inhaler  . Gestational diabetes   . Hypertension    OB History  Gravida Para Term Preterm AB Living  6 3 2 1 2 3   SAB TAB Ectopic Multiple Live Births  1 1     3     # Outcome Date GA Lbr Len/2nd Weight Sex Delivery Anes PTL Lv  6 Current           5 TAB 05/20/18          4 Preterm 02/16/16 2446w0d    CS-Unspec     3 Term 05/22/12 3829w0d    Vag-Spont     2 SAB 11/07/10          1 Term 05/29/10 3629w0d    Vag-Spont      Past Surgical History:  Procedure Laterality Date  . CESAREAN SECTION    . CHOLECYSTECTOMY     History reviewed. No pertinent family history. Social History   Tobacco Use  . Smoking status: Never Smoker  . Smokeless tobacco: Never Used  Substance Use Topics  . Alcohol use: Not Currently  . Drug use: Yes    Types: Marijuana    Comment: used yesterday, 11/16/19   Allergies  Allergen Reactions  . Phenergan [Promethazine Hcl]     Tardive dyskinesia    Medications Prior to Admission  Medication Sig Dispense Refill Last Dose  .  diphenhydrAMINE (BENADRYL) 25 mg capsule Take 1 capsule (25 mg total) by mouth every 6 (six) hours as needed (nausea). 30 capsule 0 Unknown at Unknown time  . docusate sodium (COLACE) 100 MG capsule Take 1 capsule (100 mg total) by mouth 2 (two) times daily. 60 capsule 0   . glycopyrrolate (ROBINUL) 2 MG tablet Take 1 tablet (2 mg total) by mouth 3 (three) times daily as needed. 30 tablet 3   . metoCLOPramide (REGLAN) 10 MG tablet Take 1 tablet (10 mg total) by mouth every 6 (six) hours as needed for nausea or vomiting. 30 tablet 2   . ondansetron (ZOFRAN ODT) 4 MG disintegrating tablet Take 1 tablet (4 mg total) by mouth every 6 (six) hours. 30 tablet 2   . pantoprazole (PROTONIX) 40 MG tablet Take 1 tablet (40 mg total) by mouth 2 (two) times daily. 60 tablet 2   . Prenatal Vit-Fe Fumarate-FA (PREPLUS) 27-1 MG TABS Take 1 tablet by mouth daily. 30 tablet 13   . scopolamine (TRANSDERM-SCOP) 1 MG/3DAYS Place 1 patch (1.5 mg total) onto the skin every 3 (three) days. 10 patch 12     I have reviewed patient's Past Medical Hx, Surgical Hx, Family Hx,  Social Hx, medications and allergies.   ROS:  Review of Systems  Constitutional: Negative.   Gastrointestinal: Positive for abdominal pain, constipation, nausea and vomiting. Negative for blood in stool and diarrhea.  Genitourinary: Negative.     Physical Exam   Patient Vitals for the past 24 hrs:  BP Temp Temp src Pulse Resp SpO2  11/17/19 1125 113/62 - - 92 - 99 %  11/17/19 0931 111/75 98.1 F (36.7 C) Oral 84 18 97 %    Constitutional: Well-developed, well-nourished female in no acute distress.  Cardiovascular: normal rate & rhythm, no murmur Respiratory: normal effort, lung sounds clear throughout GI: Abd soft, non-tender, gravid appropriate for gestational age. Pos BS x 4 MS: Extremities nontender, no edema, normal ROM Neurologic: Alert and oriented x 4.  GU:   No blood  Dilation: Closed Effacement (%): 50 Cervical Position:  Posterior Station: -3 Exam by:: Judeth Horn NP  NST:  Baseline: 140 bpm, Variability: Good {> 6 bpm), Accelerations: Reactive and Decelerations: Absent   Labs: No results found for this or any previous visit (from the past 24 hour(s)).  Imaging:  No results found.  MAU Course: Orders Placed This Encounter  Procedures  . Urinalysis, Routine w reflex microscopic  . Soap suds enema  . Discharge patient     MDM: No contractions on monitor. Abdomen soft & non tender. Cervix closed  Pt reports constipation. Given option of treating at home vs enema in MAU - pt prefer enema. Soap suds enema performed but patient did not tolerate the procedure & requested that the nurse discontinue.  Thorough discussion about treatment of constipation at home as well as written instructions for cleanout & constipation management.   Patient not observed vomiting in MAU, is spitting. States she's been taking her meds & taking colace daily, but when asked if she needed refills she states that she's been without her meds since before her recent hospitalization & was never given refills. Verified per Epic that patient was given multiple refills for all of her meds & pt informed. The pharmacy is closed for the weekend, offered to send the prescriptions to pharmacy of her choice so she can pick them up before Monday - she agrees but would like to speak with the ob/gyn about her care in MAU. Dr. Jolayne Panther called to the bedside to speak with patient.   Dr. Jolayne Panther spoke with patient regarding plan to discharge home with medication management. Will replace scop patch prior to discharge.   Pt now asking for all of her meds prior to discharge. Will give reglan, robinul, & protonix with plan to discharge home.   Pt did not give urine sample while in MAU. Had a negative urine culture within the last week.   Assessment: 1. Constipation during pregnancy in third trimester   2. [redacted] weeks gestation of pregnancy   3.  Hyperemesis affecting pregnancy, antepartum     Plan: Discharge home in stable condition.  Discussed reasons to return to MAU Meds refilled Rx colace Written info given for constipation treatment at home   Allergies as of 11/17/2019      Reactions   Phenergan [promethazine Hcl]    Tardive dyskinesia       Medication List    TAKE these medications   diphenhydrAMINE 25 mg capsule Commonly known as: BENADRYL Take 1 capsule (25 mg total) by mouth every 6 (six) hours as needed (nausea).   docusate sodium 100 MG capsule Commonly known as: COLACE Take 1 capsule (  100 mg total) by mouth 2 (two) times daily.   glycopyrrolate 2 MG tablet Commonly known as: ROBINUL Take 1 tablet (2 mg total) by mouth 3 (three) times daily as needed.   metoCLOPramide 10 MG tablet Commonly known as: REGLAN Take 1 tablet (10 mg total) by mouth every 6 (six) hours as needed for nausea or vomiting.   ondansetron 4 MG disintegrating tablet Commonly known as: Zofran ODT Take 1 tablet (4 mg total) by mouth every 6 (six) hours.   pantoprazole 40 MG tablet Commonly known as: Protonix Take 1 tablet (40 mg total) by mouth 2 (two) times daily.   PrePLUS 27-1 MG Tabs Take 1 tablet by mouth daily.   scopolamine 1 MG/3DAYS Commonly known as: TRANSDERM-SCOP Place 1 patch (1.5 mg total) onto the skin every 3 (three) days.       Jorje Guild, NP 11/17/2019 1:38 PM

## 2019-11-17 NOTE — MAU Note (Signed)
RN attempted soapsuds enema on pt.  Pt could not tolerate advancement of enema. Pt asked RN to stop and no longer desires an enema.  Pt requesting RX for stool softener.  NP made aware of pt requests.

## 2019-11-17 NOTE — Discharge Instructions (Signed)
Morning Sickness  Morning sickness is when you feel sick to your stomach (nauseous) during pregnancy. You may feel sick to your stomach and throw up (vomit). You may feel sick in the morning, but you can feel this way at any time of day. Some women feel very sick to their stomach and cannot stop throwing up (hyperemesis gravidarum). Follow these instructions at home: Medicines  Take over-the-counter and prescription medicines only as told by your doctor. Do not take any medicines until you talk with your doctor about them first.  Taking multivitamins before getting pregnant can stop or lessen the harshness of morning sickness. Eating and drinking  Eat dry toast or crackers before getting out of bed.  Eat 5 or 6 small meals a day.  Eat dry and bland foods like rice and baked potatoes.  Do not eat greasy, fatty, or spicy foods.  Have someone cook for you if the smell of food causes you to feel sick or throw up.  If you feel sick to your stomach after taking prenatal vitamins, take them at night or with a snack.  Eat protein when you need a snack. Nuts, yogurt, and cheese are good choices.  Drink fluids throughout the day.  Try ginger ale made with real ginger, ginger tea made from fresh grated ginger, or ginger candies. General instructions  Do not use any products that have nicotine or tobacco in them, such as cigarettes and e-cigarettes. If you need help quitting, ask your doctor.  Use an air purifier to keep the air in your house free of smells.  Get lots of fresh air.  Try to avoid smells that make you feel sick.  Try: ? Wearing a bracelet that is used for seasickness (acupressure wristband). ? Going to a doctor who puts thin needles into certain body points (acupuncture) to improve how you feel. Contact a doctor if:  You need medicine to feel better.  You feel dizzy or light-headed.  You are losing weight. Get help right away if:  You feel very sick to your  stomach and cannot stop throwing up.  You pass out (faint).  You have very bad pain in your belly. Summary  Morning sickness is when you feel sick to your stomach (nauseous) during pregnancy.  You may feel sick in the morning, but you can feel this way at any time of day.  Making some changes to what you eat may help your symptoms go away. This information is not intended to replace advice given to you by your health care provider. Make sure you discuss any questions you have with your health care provider. Document Released: 01/13/2005 Document Revised: 11/18/2017 Document Reviewed: 01/06/2017 Elsevier Patient Education  2020 Elsevier Inc.  

## 2019-11-17 NOTE — Progress Notes (Signed)
Pt states that the Zofran did not help her nausea. Was able to keep down apple juice.

## 2019-11-20 ENCOUNTER — Inpatient Hospital Stay (HOSPITAL_COMMUNITY): Payer: Medicaid Other

## 2019-11-20 ENCOUNTER — Encounter (HOSPITAL_COMMUNITY): Payer: Self-pay

## 2019-11-20 ENCOUNTER — Inpatient Hospital Stay (HOSPITAL_COMMUNITY)
Admission: EM | Admit: 2019-11-20 | Discharge: 2019-11-21 | DRG: 833 | Disposition: A | Discharge status: Home / Self Care | Payer: Medicaid Other | Attending: Obstetrics & Gynecology | Admitting: Obstetrics & Gynecology

## 2019-11-20 ENCOUNTER — Other Ambulatory Visit: Payer: Self-pay

## 2019-11-20 DIAGNOSIS — O0933 Supervision of pregnancy with insufficient antenatal care, third trimester: Secondary | ICD-10-CM

## 2019-11-20 DIAGNOSIS — O09293 Supervision of pregnancy with other poor reproductive or obstetric history, third trimester: Secondary | ICD-10-CM | POA: Diagnosis not present

## 2019-11-20 DIAGNOSIS — Z3A32 32 weeks gestation of pregnancy: Secondary | ICD-10-CM

## 2019-11-20 DIAGNOSIS — O210 Mild hyperemesis gravidarum: Principal | ICD-10-CM | POA: Diagnosis present

## 2019-11-20 DIAGNOSIS — O0930 Supervision of pregnancy with insufficient antenatal care, unspecified trimester: Secondary | ICD-10-CM

## 2019-11-20 DIAGNOSIS — O0993 Supervision of high risk pregnancy, unspecified, third trimester: Secondary | ICD-10-CM | POA: Diagnosis not present

## 2019-11-20 DIAGNOSIS — R634 Abnormal weight loss: Secondary | ICD-10-CM

## 2019-11-20 DIAGNOSIS — O365930 Maternal care for other known or suspected poor fetal growth, third trimester, not applicable or unspecified: Secondary | ICD-10-CM | POA: Diagnosis present

## 2019-11-20 DIAGNOSIS — O34219 Maternal care for unspecified type scar from previous cesarean delivery: Secondary | ICD-10-CM

## 2019-11-20 DIAGNOSIS — R1110 Vomiting, unspecified: Secondary | ICD-10-CM | POA: Diagnosis present

## 2019-11-20 DIAGNOSIS — Z9119 Patient's noncompliance with other medical treatment and regimen: Secondary | ICD-10-CM

## 2019-11-20 DIAGNOSIS — O365990 Maternal care for other known or suspected poor fetal growth, unspecified trimester, not applicable or unspecified: Secondary | ICD-10-CM

## 2019-11-20 DIAGNOSIS — O212 Late vomiting of pregnancy: Secondary | ICD-10-CM | POA: Diagnosis present

## 2019-11-20 LAB — CBC
HCT: 28.5 % — ABNORMAL LOW (ref 36.0–46.0)
Hemoglobin: 9 g/dL — ABNORMAL LOW (ref 12.0–15.0)
MCH: 27.9 pg (ref 26.0–34.0)
MCHC: 31.6 g/dL (ref 30.0–36.0)
MCV: 88.2 fL (ref 80.0–100.0)
Platelets: 236 10*3/uL (ref 150–400)
RBC: 3.23 MIL/uL — ABNORMAL LOW (ref 3.87–5.11)
RDW: 21 % — ABNORMAL HIGH (ref 11.5–15.5)
WBC: 8.6 10*3/uL (ref 4.0–10.5)
nRBC: 0 % (ref 0.0–0.2)

## 2019-11-20 LAB — URINALYSIS, ROUTINE W REFLEX MICROSCOPIC
Glucose, UA: NEGATIVE mg/dL
Hgb urine dipstick: NEGATIVE
Ketones, ur: 80 mg/dL — AB
Leukocytes,Ua: NEGATIVE
Nitrite: NEGATIVE
Protein, ur: 100 mg/dL — AB
Specific Gravity, Urine: 1.028 (ref 1.005–1.030)
pH: 5 (ref 5.0–8.0)

## 2019-11-20 LAB — BASIC METABOLIC PANEL
Anion gap: 12 (ref 5–15)
BUN: 5 mg/dL — ABNORMAL LOW (ref 6–20)
CO2: 24 mmol/L (ref 22–32)
Calcium: 8.9 mg/dL (ref 8.9–10.3)
Chloride: 101 mmol/L (ref 98–111)
Creatinine, Ser: 0.69 mg/dL (ref 0.44–1.00)
GFR calc Af Amer: >60 mL/min (ref >60)
GFR calc non Af Amer: >60 mL/min (ref >60)
Glucose, Bld: 87 mg/dL (ref 70–99)
Potassium: 3.7 mmol/L (ref 3.5–5.1)
Sodium: 137 mmol/L (ref 135–145)

## 2019-11-20 LAB — TYPE AND SCREEN
ABO/RH(D): O POS
Antibody Screen: NEGATIVE

## 2019-11-20 LAB — FETAL FIBRONECTIN: Fetal Fibronectin: NEGATIVE

## 2019-11-20 LAB — LIPASE, BLOOD: Lipase: 14 U/L (ref 11–51)

## 2019-11-20 LAB — AMYLASE: Amylase: 49 U/L (ref 28–100)

## 2019-11-20 MED ORDER — GLYCOPYRROLATE 1 MG PO TABS: 2.0000 mg | ORAL_TABLET | Freq: Three times a day (TID) | ORAL | Status: DC | PRN

## 2019-11-20 MED ORDER — DIPHENHYDRAMINE HCL 50 MG/ML IJ SOLN
12.5000 mg | Freq: Once | INTRAMUSCULAR | Status: AC
  Administered 2019-11-20: 12:00:00 12.5 mg via INTRAVENOUS
  Filled 2019-11-20: qty 1

## 2019-11-20 MED ORDER — ZOLPIDEM TARTRATE 5 MG PO TABS: 5.0000 mg | ORAL_TABLET | Freq: Every evening | ORAL | Status: DC | PRN

## 2019-11-20 MED ORDER — SCOPOLAMINE 1 MG/3DAYS TD PT72
1.0000 | MEDICATED_PATCH | Freq: Once | TRANSDERMAL | Status: DC
  Administered 2019-11-20: 06:00:00 1.5 mg via TRANSDERMAL
  Filled 2019-11-20: qty 1

## 2019-11-20 MED ORDER — SODIUM CHLORIDE 0.9 % IV SOLN
510.0000 mg | Freq: Once | INTRAVENOUS | Status: AC
  Administered 2019-11-20: 09:00:00 510 mg via INTRAVENOUS
  Filled 2019-11-20: qty 17

## 2019-11-20 MED ORDER — DEXAMETHASONE SODIUM PHOSPHATE 10 MG/ML IJ SOLN
10.0000 mg | Freq: Once | INTRAMUSCULAR | Status: AC
  Administered 2019-11-20: 10 mg via INTRAVENOUS
  Filled 2019-11-20: qty 1

## 2019-11-20 MED ORDER — BISACODYL 10 MG RE SUPP
10.0000 mg | Freq: Once | RECTAL | Status: AC
  Administered 2019-11-20: 10 mg via RECTAL
  Filled 2019-11-20: qty 1

## 2019-11-20 MED ORDER — GLYCOPYRROLATE 0.2 MG/ML IJ SOLN
0.1000 mg | Freq: Once | INTRAMUSCULAR | Status: AC
  Administered 2019-11-20: 03:00:00 0.1 mg via INTRAVENOUS
  Filled 2019-11-20: qty 1

## 2019-11-20 MED ORDER — FAMOTIDINE IN NACL 20-0.9 MG/50ML-% IV SOLN
20.0000 mg | Freq: Once | INTRAVENOUS | Status: AC
  Administered 2019-11-20: 04:00:00 20 mg via INTRAVENOUS
  Filled 2019-11-20: qty 50

## 2019-11-20 MED ORDER — DEXTROSE-NACL 5-0.9 % IV SOLN
INTRAVENOUS | Status: DC
  Administered 2019-11-20 (×2): via INTRAVENOUS

## 2019-11-20 MED ORDER — CALCIUM CARBONATE ANTACID 500 MG PO CHEW: 2.0000 | CHEWABLE_TABLET | ORAL | Status: DC | PRN

## 2019-11-20 MED ORDER — DOCUSATE SODIUM 100 MG PO CAPS
100.0000 mg | ORAL_CAPSULE | Freq: Every day | ORAL | Status: DC
  Administered 2019-11-20 – 2019-11-21 (×2): 100 mg via ORAL
  Filled 2019-11-20 (×2): qty 1

## 2019-11-20 MED ORDER — LACTATED RINGERS IV BOLUS
1000.0000 mL | Freq: Once | INTRAVENOUS | Status: AC
  Administered 2019-11-20: 1000 mL via INTRAVENOUS

## 2019-11-20 MED ORDER — PRENATAL MULTIVITAMIN CH
1.0000 | ORAL_TABLET | Freq: Every day | ORAL | Status: DC
  Administered 2019-11-20 – 2019-11-21 (×2): 1 via ORAL
  Filled 2019-11-20 (×2): qty 1

## 2019-11-20 MED ORDER — ONDANSETRON HCL 4 MG/2ML IJ SOLN
4.0000 mg | Freq: Four times a day (QID) | INTRAMUSCULAR | Status: DC | PRN
  Administered 2019-11-20: 4 mg via INTRAVENOUS
  Filled 2019-11-20: qty 2

## 2019-11-20 MED ORDER — SODIUM CHLORIDE 0.9 % IV SOLN
INTRAVENOUS | Status: DC
  Administered 2019-11-20: 09:00:00 via INTRAVENOUS

## 2019-11-20 MED ORDER — FLEET ENEMA 7-19 GM/118ML RE ENEM: 1.0000 | ENEMA | Freq: Once | RECTAL | Status: DC

## 2019-11-20 MED ORDER — LACTATED RINGERS IV SOLN
Freq: Once | INTRAVENOUS | Status: AC
  Administered 2019-11-20: 03:00:00 via INTRAVENOUS

## 2019-11-20 MED ORDER — ACETAMINOPHEN 325 MG PO TABS: 650.0000 mg | ORAL_TABLET | ORAL | Status: DC | PRN

## 2019-11-20 MED ORDER — ONDANSETRON HCL 4 MG/2ML IJ SOLN: 4.0000 mg | Freq: Once | INTRAMUSCULAR | Status: DC

## 2019-11-20 NOTE — MAU Note (Signed)
Patient presents from ED w/ complaint of N/V.  States she hasn't been seen for [facility] yet.  No LOF/VB.  Reports she's also having contractions but hasn't timed them.  CTX have been ongoing-started prior to the N/V.

## 2019-11-20 NOTE — H&P (Signed)
FACULTY PRACTICE ANTEPARTUM ADMISSION HISTORY AND PHYSICAL NOTE  First Provider Initiated Contact with Patient 11/20/19 0233      HPI: Julie Coleman is a 28 y.o. [id number] at 3632w3dwho presents to maternity admissions reporting vomiting.   This is her 9th visit to MAU since early November.   Has been admitted 3 times for this.  Has been on multiple meds but doesn't take them regularly. Does not offer explanation for this.  Initially stated she took "all of them" yesterday but then denies this.  Doesn't remember when she placed her patch (scop).  . Also reports no bowel movement in 2 weeks   Was offered an enema last visit but did not allow completion of it.   She reports good fetal movement, denies LOF, vaginal bleeding, vaginal itching/burning, urinary symptoms, h/a, dizziness, diarrhea, or fever/chills.    Weights:   11/17  = 76.2kg                   11/19  = 76.9                   11/24  =  77.1                    12.1   =   74.4  Emesis  This is a recurrent problem. The current episode started more than 1 month ago. The problem has been unchanged. There has been no fever. Pertinent negatives include no abdominal pain, chest pain, chills, coughing, diarrhea, dizziness, fever or headaches. Associated symptoms comments: Constipation, spitting . She has tried nothing for the symptoms.   ED RN Note: Per EMS, pt is [redacted] weeks pregnant, been having dark emesis.  Was given 4mg  of Zofran by EMS, no relief.  Pt reports she is a high risk pregnancy, diabetic and preeclampsia.  Pt states she has not felt the baby move "in a while"  Called MAU, pt will go to w/ RN to MAU   MAU RN Note: Patient presents from ED w/ complaint of N/V.  [facility]  No LOF/VB.  Reports she's also having contractions but hasn't timed them.  CTX have been ongoing-started prior to the N/V.  Patient Active Problem List   Diagnosis Date Noted  . Hyperemesis 11/20/2019  . Hyperemesis affecting pregnancy,  antepartum 11/13/2019  . Rubella non-immune status, antepartum 11/12/2019  . Cannabis hyperemesis syndrome concurrent with and due to cannabis abuse (HCC) 11/09/2019  . Nausea and vomiting in pregnancy 11/08/2019  . Heartburn during pregnancy, antepartum 10/25/2019  . Trichomonal vaginitis during pregnancy 10/22/2019  . Anemia affecting pregnancy 10/22/2019    Past Medical History:  Diagnosis Date  . Asthma    albuteral inhaler  . Gestational diabetes   . Hypertension     Past Surgical History:  Procedure Laterality Date  . CESAREAN SECTION    . CHOLECYSTECTOMY      OB History  Gravida Para Term Preterm AB Living  6 3 2 1 2 3   SAB TAB Ectopic Multiple Live Births  1 1     3     # Outcome Date GA Lbr Len/2nd Weight Sex Delivery Anes PTL Lv  6 Current           5 TAB 05/20/18          4 Preterm 02/16/16 4626w0d    CS-Unspec     3 Term 05/22/12 5134w0d    Vag-Spont  2 SAB 11/07/10          1 Term 05/29/10 [redacted]w[redacted]d    Vag-Spont       Social History   Socioeconomic History  . Marital status: Single    Spouse name: Not on file  . Number of children: Not on file  . Years of education: Not on file  . Highest education level: Not on file  Occupational History  . Not on file  Social Needs  . Financial resource strain: Not on file  . Food insecurity    Worry: Not on file    Inability: Not on file  . Transportation needs    Medical: Not on file    Non-medical: Not on file  Tobacco Use  . Smoking status: Never Smoker  . Smokeless tobacco: Never Used  Substance and Sexual Activity  . Alcohol use: Not Currently  . Drug use: Yes    Types: Marijuana    Comment: used yesterday, 11/16/19  . Sexual activity: Not Currently  Lifestyle  . Physical activity    Days per week: Not on file    Minutes per session: Not on file  . Stress: Not on file  Relationships  . Social Musician on phone: Not on file    Gets together: Not on file    Attends religious service:  Not on file    Active member of club or organization: Not on file    Attends meetings of clubs or organizations: Not on file    Relationship status: Not on file  Other Topics Concern  . Not on file  Social History Narrative  . Not on file    History reviewed. No pertinent family history.  Allergies  Allergen Reactions  . Phenergan [Promethazine Hcl]     Tardive dyskinesia     Medications Prior to Admission  Medication Sig Dispense Refill Last Dose  . diphenhydrAMINE (BENADRYL) 25 mg capsule Take 1 capsule (25 mg total) by mouth every 6 (six) hours as needed (nausea). 30 capsule 0   . docusate sodium (COLACE) 100 MG capsule Take 1 capsule (100 mg total) by mouth 2 (two) times daily. 60 capsule 0   . glycopyrrolate (ROBINUL) 2 MG tablet Take 1 tablet (2 mg total) by mouth 3 (three) times daily as needed. 30 tablet 3   . metoCLOPramide (REGLAN) 10 MG tablet Take 1 tablet (10 mg total) by mouth every 6 (six) hours as needed for nausea or vomiting. 30 tablet 2   . ondansetron (ZOFRAN ODT) 4 MG disintegrating tablet Take 1 tablet (4 mg total) by mouth every 6 (six) hours. 30 tablet 2   . pantoprazole (PROTONIX) 40 MG tablet Take 1 tablet (40 mg total) by mouth 2 (two) times daily. 60 tablet 2   . Prenatal Vit-Fe Fumarate-FA (PREPLUS) 27-1 MG TABS Take 1 tablet by mouth daily. 30 tablet 13   . scopolamine (TRANSDERM-SCOP) 1 MG/3DAYS Place 1 patch (1.5 mg total) onto the skin every 3 (three) days. 10 patch 12     ROS:  Review of Systems  Constitutional: Negative for chills and fever.  Respiratory: Negative for cough.   Cardiovascular: Negative for chest pain.  Gastrointestinal: Positive for vomiting. Negative for abdominal pain and diarrhea.  Neurological: Negative for dizziness and headaches  Vitals:  BP 140/85 (BP Location: Right Arm)   Pulse 75   Temp 98.1 F (36.7 C) (Oral)   Resp 18   Wt 74.4 kg   LMP 04/07/2019  SpO2 100%   BMI 25.71 kg/m  Physical  Examination: Constitutional: Well-developed, well-nourished female in no acute distress.  Cardiovascular: normal rate and rhythm Respiratory: normal effort, clear to auscultation bilaterally GI: Abd soft, non-tender, gravid appropriate for gestational age.   No rebound or guarding. MS: Extremities nontender, no edema, normal ROM Neurologic: Alert and oriented x 4.  GU: Neg CVAT.  PELVIC EXAM:   Closed/40%/-3/vertex  FHT:  Baseline 140 , moderate variability, accelerations present, no decelerations Contractions: q 5 mins Irregular    Labs:  Results for orders placed or performed during the hospital encounter of 11/20/19 (from the past 24 hour(s))  Fetal fibronectin   Collection Time: 11/20/19  3:51 AM  Result Value Ref Range   Fetal Fibronectin NEGATIVE NEGATIVE  CBC   Collection Time: 11/20/19  4:13 AM  Result Value Ref Range   WBC 8.6 4.0 - 10.5 K/uL   RBC 3.23 (L) 3.87 - 5.11 MIL/uL   Hemoglobin 9.0 (L) 12.0 - 15.0 g/dL   HCT 28.5 (L) 36.0 - 46.0 %   MCV 88.2 80.0 - 100.0 fL   MCH 27.9 26.0 - 34.0 pg   MCHC 31.6 30.0 - 36.0 g/dL   RDW 21.0 (H) 11.5 - 15.5 %   Platelets 236 150 - 400 K/uL   nRBC 0.0 0.0 - 0.2 %  Basic metabolic panel   Collection Time: 11/20/19  4:13 AM  Result Value Ref Range   Sodium 137 135 - 145 mmol/L   Potassium 3.7 3.5 - 5.1 mmol/L   Chloride 101 98 - 111 mmol/L   CO2 24 22 - 32 mmol/L   Glucose, Bld 87 70 - 99 mg/dL   BUN 5 (L) 6 - 20 mg/dL   Creatinine, Ser 0.69 0.44 - 1.00 mg/dL   Calcium 8.9 8.9 - 10.3 mg/dL   GFR calc non Af Amer >60 >60 mL/min   GFR calc Af Amer >60 >60 mL/min   Anion gap 12 5 - 15  Lipase, blood   Collection Time: 11/20/19  4:13 AM  Result Value Ref Range   Lipase 14 11 - 51 U/L  Amylase   Collection Time: 11/20/19  4:13 AM  Result Value Ref Range   Amylase 49 28 - 100 U/L  Urinalysis, Routine w reflex microscopic   Collection Time: 11/20/19  5:29 AM  Result Value Ref Range   Color, Urine AMBER (A) YELLOW    APPearance CLEAR CLEAR   Specific Gravity, Urine 1.028 1.005 - 1.030   pH 5.0 5.0 - 8.0   Glucose, UA NEGATIVE NEGATIVE mg/dL   Hgb urine dipstick NEGATIVE NEGATIVE   Bilirubin Urine SMALL (A) NEGATIVE   Ketones, ur 80 (A) NEGATIVE mg/dL   Protein, ur 100 (A) NEGATIVE mg/dL   Nitrite NEGATIVE NEGATIVE   Leukocytes,Ua NEGATIVE NEGATIVE   RBC / HPF 0-5 0 - 5 RBC/hpf   WBC, UA 11-20 0 - 5 WBC/hpf   Bacteria, UA RARE (A) NONE SEEN   Squamous Epithelial / LPF 0-5 0 - 5   Mucus PRESENT   Type and screen [place]   Collection Time: 11/20/19 11:09 AM  Result Value Ref Range   ABO/RH(D) O POS    Antibody Screen NEG    Sample Expiration      11/23/2019,2359 Performed at [facility], [address] [place], [place] [address]     Imaging Studies: [place] Mfm Ob Limited  Result Date: 10/25/2019 ----------------------------------------------------------------------  OBSTETRICS REPORT                       (  Signed Final 10/25/2019 09:34 am) ---------------------------------------------------------------------- Patient Info  ID #:       [id number]                          D.O.B.:  December 27, 1990 (28 yrs)  Name:       Julie Coleman                   Visit Date: 10/24/2019 09:24 pm ---------------------------------------------------------------------- Performed By  Performed By:     Tomma Lightning             Ref. Address:     [address]                                                             [place], [place]                                                             [address]  Attending:        Noralee Space MD        [facility]  Referred By:      Lawernce Pitts CNM ---------------------------------------------------------------------- Orders   #  Description                           Code         Ordered By   1  [place] MFM OB LIMITED                    19147.82     Donette Larry  ----------------------------------------------------------------------   #  Order #                    Accession #                 Episode #   1  [id number]                  [id number]  [id number]  ---------------------------------------------------------------------- Indications   Decreased fetal movement                       O36.8190   Insufficient Prenatal Care (Limited)           O09.30   [redacted] weeks gestation of pregnancy                Z3A.28   Poor obstetric history: Previous preterm       O09.219   delivery, antepartum (  wks)  ---------------------------------------------------------------------- Fetal Evaluation  Num Of Fetuses:         1  Fetal Heart Rate(bpm):  146  Cardiac Activity:       Observed  Presentation:           Cephalic  Placenta:               No abruption or previa seen, posterior  Amniotic Fluid  AFI FV:      Within normal limits  AFI Sum(cm)     %Tile       Largest Pocket(cm)  11.54           24          3.31  RUQ(cm)       RLQ(cm)       LUQ(cm)        LLQ(cm)  2.38          2.99          2.86           3.31 ---------------------------------------------------------------------- Biometry  BPD:      67.5  mm     G. Age:  27w 1d          6  %                                                          FL/BPD:     80.9   %    71 - 87  FL:       54.6  mm     G. Age:  28w 6d         43  % ---------------------------------------------------------------------- OB History  Gravidity:    6         Term:   2        Prem:   1        SAB:   1  TOP:          1        Living:  3 ---------------------------------------------------------------------- Gestational Age  LMP:           28w 4d        Date:  04/07/19                 EDD:   01/12/20  U/S Today:     28w 0d                                        EDD:   01/16/20  Best:          28w 4d     Det. By:  LMP  (04/07/19)  EDD:   01/12/20  ---------------------------------------------------------------------- Cervix Uterus Adnexa  Cervix  Length:           5.14  cm.  Normal appearance by transabdominal scan. ---------------------------------------------------------------------- Impression  Patient was evaluated at the MAU for c/o nausea and  vomiting.  A limited ultrasound study was performed. Amniotic fluid is  normal and good fetal activity is seen. Cephalic presentation. ---------------------------------------------------------------------- Recommendations  -Appointment for detailed ultrasound after discharge. ----------------------------------------------------------------------                  Noralee Space, MD Electronically Signed Final Report   10/25/2019 09:34 am ----------------------------------------------------------------------  MAU Course/MDM: I have ordered labs and reviewed results.  Labs are normal  No evidence for pancreatitis or other illness.    NST reviewed, reassuring Fetal fibronectin sent due to contractions and it was negative.  Suspect UCs were due to dehydration IV hydration done with Zofran, Decadron and scopalamine patch replaced.  She continues to "vomit" though no gastric contents seen.  Spits frequently She requests treatment for her constipation.  Refused Fleet enema   Agrees to Dulcolax suppository which gave small results  She later asks about being admitted. States "I think it is best for me"  Reviewed all of her results and good responses to medication.  Discussed we will try to avoid hospitalization if we can manage outpatient.  She is not satisfied with this answer.  Consult Dr Vergie Living with presentation, exam findings and test results.   He suggests getting her growth and anatomy US done since she was not able to do it as scheduled He then recommends sending her home with Benadryl PRN on top of existing meds I will call pharmacy to verify that she did indeed pick them up Will hydrate a bit more  to dilute urine more.   Urine culture negative last week.  Has had most of her OB labs. She has had the first weight loss documented ever, this week (just under 3kg)   Care taken over from Wynelle Bourgeois CNM @ [phone] Pharmacy called and patient has not picked up any medication from [facility] that was sent to the pharmacy d/t cost - pharmacy reports they do not have medicaid card on file.   Reassessment @0900 - Second feraheme infusion ordered, d/t patient not setting up outpatient infusion appointment   Consult with Dr who recommends admission for [facility] speciality, considering admission until delivery d/t new diagnoses of IUGR based on anatomy [facility] today    Assessment and Plan: Patient Active Problem List   Diagnosis Date Noted  . Hyperemesis 11/20/2019  . IUGR (intrauterine growth restriction) affecting care of mother 11/20/2019  . Hyperemesis affecting pregnancy, antepartum 11/13/2019  . Rubella non-immune status, antepartum 11/12/2019  . Cannabis hyperemesis syndrome concurrent with and due to cannabis abuse (HCC) 11/09/2019  . Nausea and vomiting in pregnancy 11/08/2019  . Heartburn during pregnancy, antepartum 10/25/2019  . Trichomonal vaginitis during pregnancy 10/22/2019  . Anemia affecting pregnancy 10/22/2019   Noncompliance with prenatal care, no records of previous care Social concerns Hyperemesis and Ptyalism Dehydration with weight loss New diagnoses of IUGR based on anatomy 13/01/2019 performed today    Admit to Antenatal Second feraheme infusion completed in MAU prior to admission Robinul, benadryl  and zofran ordered  Continuous D5NS infusion  Will recheck presentation if she does progress in preterm labor to determine route of delivery Routine antenatal care  [place] CNM  Faculty Practice, [facility]

## 2019-11-20 NOTE — MAU Provider Note (Addendum)
Chief Complaint:  Emesis and Contractions   First Provider Initiated Contact with Patient 11/20/19 0233      HPI: Julie Coleman is a 28 y.o. [id number] at 57w3dwho presents to maternity admissions reporting vomiting.   This is her 9th visit to MAU since early November.   Has been admitted 3 times for this.  Has been on multiple meds but doesn't take them regularly. Does not offer explanation for this.  Initially stated she took "all of them" yesterday but then denies this.  Doesn't remember when she placed her patch (scop).  . Also reports no bowel movement in 2 weeks   Was offered an enema last visit but did not allow completion of it.   She reports good fetal movement, denies LOF, vaginal bleeding, vaginal itching/burning, urinary symptoms, h/a, dizziness, diarrhea, or fever/chills.    Weights:   11/17  = 76.2kg                   11/19  = 76.9                   11/24  =  77.1                    12.1   =   74.4  Emesis  This is a recurrent problem. The current episode started more than 1 month ago. The problem has been unchanged. There has been no fever. Pertinent negatives include no abdominal pain, chest pain, chills, coughing, diarrhea, dizziness, fever or headaches. Associated symptoms comments: Constipation, spitting . She has tried nothing for the symptoms.   ED RN Note: Per EMS, pt is [redacted] weeks pregnant, been having dark emesis.  Was given 4mg  of Zofran by EMS, no relief.  Pt reports she is a high risk pregnancy, diabetic and preeclampsia.  Pt states she has not felt the baby move "in a while"  Called MAU, pt will go to w/ RN to MAU   MAU RN Note: Patient presents from ED w/ complaint of N/V.  States she hasn't been seen for [facility] yet.  No LOF/VB.  Reports she's also having contractions but hasn't timed them.  CTX have been ongoing-started prior to the N/V. Past Medical History: Past Medical History:  Diagnosis Date  . Asthma    albuteral inhaler  . Gestational diabetes   . Hypertension      Past obstetric history: OB History  Gravida Para Term Preterm AB Living  6 3 2 1 2 3   SAB TAB Ectopic Multiple Live Births  1 1     3     # Outcome Date GA Lbr Len/2nd Weight Sex Delivery Anes PTL Lv  6 Current           5 TAB 05/20/18          4 Preterm 02/16/16 [redacted]w[redacted]d    CS-Unspec     3 Term 05/22/12 [redacted]w[redacted]d    Vag-Spont     2 SAB 11/07/10          1 Term 05/29/10 [redacted]w[redacted]d    Vag-Spont       Past Surgical History: Past Surgical History:  Procedure Laterality Date  . CESAREAN SECTION    . CHOLECYSTECTOMY      Family History: No family history on file.  Social History: Social History   Tobacco Use  . Smoking status: Never Smoker  . Smokeless tobacco: Never Used  Substance Use Topics  . Alcohol use: Not  Currently  . Drug use: Yes    Types: Marijuana    Comment: used yesterday, 11/16/19    Allergies:  Allergies  Allergen Reactions  . Phenergan [Promethazine Hcl]     Tardive dyskinesia     Meds:  Medications Prior to Admission  Medication Sig Dispense Refill Last Dose  . diphenhydrAMINE (BENADRYL) 25 mg capsule Take 1 capsule (25 mg total) by mouth every 6 (six) hours as needed (nausea). 30 capsule 0   . docusate sodium (COLACE) 100 MG capsule Take 1 capsule (100 mg total) by mouth 2 (two) times daily. 60 capsule 0   . glycopyrrolate (ROBINUL) 2 MG tablet Take 1 tablet (2 mg total) by mouth 3 (three) times daily as needed. 30 tablet 3   . metoCLOPramide (REGLAN) 10 MG tablet Take 1 tablet (10 mg total) by mouth every 6 (six) hours as needed for nausea or vomiting. 30 tablet 2   . ondansetron (ZOFRAN ODT) 4 MG disintegrating tablet Take 1 tablet (4 mg total) by mouth every 6 (six) hours. 30 tablet 2   . pantoprazole (PROTONIX) 40 MG tablet Take 1 tablet (40 mg total) by mouth 2 (two) times daily. 60 tablet 2   . Prenatal Vit-Fe Fumarate-FA (PREPLUS) 27-1 MG TABS Take 1 tablet by mouth daily. 30 tablet 13   . scopolamine (TRANSDERM-SCOP) 1 MG/3DAYS Place 1 patch (1.5  mg total) onto the skin every 3 (three) days. 10 patch 12     I have reviewed patient's Past Medical Hx, Surgical Hx, Family Hx, Social Hx, medications and allergies.   ROS:  Review of Systems  Constitutional: Negative for chills and fever.  Respiratory: Negative for cough.   Cardiovascular: Negative for chest pain.  Gastrointestinal: Positive for vomiting. Negative for abdominal pain and diarrhea.  Neurological: Negative for dizziness and headaches.   Other systems negative  Physical Exam   Patient Vitals for the past 24 hrs:  BP Temp Temp src Pulse Resp SpO2  11/20/19 0134 108/69 98 F (36.7 C) Oral 70 18 99 %   Constitutional: Well-developed, well-nourished female in no acute distress.  Cardiovascular: normal rate and rhythm Respiratory: normal effort, clear to auscultation bilaterally GI: Abd soft, non-tender, gravid appropriate for gestational age.   No rebound or guarding. MS: Extremities nontender, no edema, normal ROM Neurologic: Alert and oriented x 4.  GU: Neg CVAT.  PELVIC EXAM:   Closed/40%/-3/vertex  FHT:  Baseline 140 , moderate variability, accelerations present, no decelerations Contractions: q 5 mins Irregular     Labs: Results for orders placed or performed during the hospital encounter of 11/20/19 (from the past 24 hour(s))  Fetal fibronectin     Status: None   Collection Time: 11/20/19  3:51 AM  Result Value Ref Range   Fetal Fibronectin NEGATIVE NEGATIVE  CBC     Status: Abnormal   Collection Time: 11/20/19  4:13 AM  Result Value Ref Range   WBC 8.6 4.0 - 10.5 K/uL   RBC 3.23 (L) 3.87 - 5.11 MIL/uL   Hemoglobin 9.0 (L) 12.0 - 15.0 g/dL   HCT 13.228.5 (L) 44.036.0 - 10.246.0 %   MCV 88.2 80.0 - 100.0 fL   MCH 27.9 26.0 - 34.0 pg   MCHC 31.6 30.0 - 36.0 g/dL   RDW 72.521.0 (H) 36.611.5 - 44.015.5 %   Platelets 236 150 - 400 K/uL   nRBC 0.0 0.0 - 0.2 %  Basic metabolic panel     Status: Abnormal   Collection Time: 11/20/19  4:13 AM  Result Value Ref Range   Sodium  137 135 - 145 mmol/L   Potassium 3.7 3.5 - 5.1 mmol/L   Chloride 101 98 - 111 mmol/L   CO2 24 22 - 32 mmol/L   Glucose, Bld 87 70 - 99 mg/dL   BUN 5 (L) 6 - 20 mg/dL   Creatinine, Ser 2.95 0.44 - 1.00 mg/dL   Calcium 8.9 8.9 - 62.1 mg/dL   GFR calc non Af Amer >60 >60 mL/min   GFR calc Af Amer >60 >60 mL/min   Anion gap 12 5 - 15  Lipase, blood     Status: None   Collection Time: 11/20/19  4:13 AM  Result Value Ref Range   Lipase 14 11 - 51 U/L  Amylase     Status: None   Collection Time: 11/20/19  4:13 AM  Result Value Ref Range   Amylase 49 28 - 100 U/L  Urinalysis, Routine w reflex microscopic     Status: Abnormal   Collection Time: 11/20/19  5:29 AM  Result Value Ref Range   Color, Urine AMBER (A) YELLOW   APPearance CLEAR CLEAR   Specific Gravity, Urine 1.028 1.005 - 1.030   pH 5.0 5.0 - 8.0   Glucose, UA NEGATIVE NEGATIVE mg/dL   Hgb urine dipstick NEGATIVE NEGATIVE   Bilirubin Urine SMALL (A) NEGATIVE   Ketones, ur 80 (A) NEGATIVE mg/dL   Protein, ur 308 (A) NEGATIVE mg/dL   Nitrite NEGATIVE NEGATIVE   Leukocytes,Ua NEGATIVE NEGATIVE   RBC / HPF 0-5 0 - 5 RBC/hpf   WBC, UA 11-20 0 - 5 WBC/hpf   Bacteria, UA RARE (A) NONE SEEN   Squamous Epithelial / LPF 0-5 0 - 5   Mucus PRESENT     --/--/O POS (11/24 1905)  Imaging:    MAU Course/MDM: I have ordered labs and reviewed results.  Labs are normal  No evidence for pancreatitis or other illness.    NST reviewed, reassuring Fetal fibronectin sent due to contractions and it was negative.  Suspect UCs were due to dehydration IV hydration done with Zofran, Decadron and scopalamine patch replaced.  She continues to "vomit" though no gastric contents seen.  Spits frequently She requests treatment for her constipation.  Refused Fleet enema   Agrees to Dulcolax suppository which gave small results  She later asks about being admitted. States "I think it is best for me"  Reviewed all of her results and good responses to  medication.  Discussed we will try to avoid hospitalization if we can manage outpatient.  She is not satisfied with this answer.  Consult Dr Vergie Living with presentation, exam findings and test results.   He suggests getting her growth and anatomy US done since she was not able to do it as scheduled He then recommends sending her home with Benadryl PRN on top of existing meds I will call pharmacy to verify that she did indeed pick them up Will hydrate a bit more to dilute urine more.   Urine culture negative last week.  Has had most of her OB labs. She has had the first weight loss documented ever, this week (just under 3kg)   Assessment: Single intrauterine pregnancy at [redacted]w[redacted]d Noncompliance with prenatal care, no records of previous care Social concerns Hyperemesis and Ptyalism Dehydration with weight loss  Plan: Care turned over to oncoming provider  Wynelle Bourgeois CNM, MSN Certified Nurse-Midwife 11/20/2019 8:34 AM  Care taken over from Wynelle Bourgeois CNM @  1610 Pharmacy called and patient has not picked up any medication from [facility] that was sent to the pharmacy d/t cost - pharmacy reports they do not have medicaid card on file.   Reassessment @0900 - Second feraheme infusion ordered, d/t patient not setting up outpatient infusion appointment  Consult with Dr who recommends admission for [facility] speciality, considering admission until delivery d/t new diagnoses of IUGR based on anatomy [facility] today    See separate H&P for admission   [place], CNM 11/20/19, 1:35 PM

## 2019-11-20 NOTE — ED Triage Notes (Addendum)
Per EMS, pt is [redacted] weeks pregnant, been having dark emesis.  Was given 4mg  of Zofran by EMS, no relief.  Pt reports she is a high risk pregnancy, diabetic and preeclampsia.  Pt states she has not felt the baby move "in a while"  Called MAU, pt will go to w/ RN to MAU    117/68 98.2  84 pulse 100% RA

## 2019-11-21 ENCOUNTER — Encounter: Payer: Self-pay | Admitting: Obstetrics & Gynecology

## 2019-11-21 ENCOUNTER — Ambulatory Visit (INDEPENDENT_AMBULATORY_CARE_PROVIDER_SITE_OTHER): Payer: Medicaid Other | Admitting: Obstetrics & Gynecology

## 2019-11-21 ENCOUNTER — Other Ambulatory Visit: Payer: Self-pay | Admitting: Obstetrics & Gynecology

## 2019-11-21 VITALS — BP 122/79 | HR 89 | Wt 173.4 lb

## 2019-11-21 DIAGNOSIS — O365930 Maternal care for other known or suspected poor fetal growth, third trimester, not applicable or unspecified: Secondary | ICD-10-CM

## 2019-11-21 DIAGNOSIS — O210 Mild hyperemesis gravidarum: Secondary | ICD-10-CM

## 2019-11-21 DIAGNOSIS — R1110 Vomiting, unspecified: Secondary | ICD-10-CM

## 2019-11-21 DIAGNOSIS — O0993 Supervision of high risk pregnancy, unspecified, third trimester: Secondary | ICD-10-CM | POA: Diagnosis not present

## 2019-11-21 DIAGNOSIS — O0990 Supervision of high risk pregnancy, unspecified, unspecified trimester: Secondary | ICD-10-CM | POA: Insufficient documentation

## 2019-11-21 LAB — HEMOGLOBIN A1C
Hgb A1c MFr Bld: 5.3 % (ref 4.8–5.6)
Mean Plasma Glucose: 105.41 mg/dL

## 2019-11-21 MED ORDER — POLYETHYLENE GLYCOL 3350 17 G PO PACK: 17.0000 g | PACK | Freq: Every day | ORAL | 0 refills | Status: DC

## 2019-11-21 MED FILL — POLYETHYLENE GLYCOL 3350 PO: 17 | 14 days supply | Qty: 238 | Fill #0

## 2019-11-21 NOTE — Discharge Summary (Signed)
Physician Discharge Summary  Patient ID: Julie Coleman MRN: [id number] DOB/AGE: 1991/08/07 28 y.o.  Admit date: 11/20/2019 Discharge date: 11/21/2019  Admission Diagnoses: 32.[redacted] weeks EGA, hyperemesis  Discharge Diagnoses: same Active Problems:   Hyperemesis   IUGR (intrauterine growth restriction) affecting care of mother   Discharged Condition: good  Hospital Course: She was admitted overnight and her nausea resolved. She was tolerating a regular diet and requesting discharge home. She has a NOB visit today.  Significant Diagnostic Studies: OB u/s showed IUGR  Treatments: IV hydration  Discharge Exam: Blood pressure 119/71, pulse 77, temperature 97.9 F (36.6 C), temperature source Oral, resp. rate 18, weight 74.4 kg, last menstrual period 04/07/2019, SpO2 100 %. General appearance: alert Resp: clear to auscultation bilaterally Cardio: regular rate and rhythm, S1, S2 normal, no murmur, click, rub or gallop GI: soft, non-tender; bowel sounds normal; no masses,  no organomegaly, gravid  Disposition: Discharge disposition: 01-Home or Self Care        Allergies as of 11/21/2019      Reactions   Phenergan [promethazine Hcl]    Tardive dyskinesia       Medication List    TAKE these medications   diphenhydrAMINE 25 mg capsule Commonly known as: BENADRYL Take 1 capsule (25 mg total) by mouth every 6 (six) hours as needed (nausea).   docusate sodium 100 MG capsule Commonly known as: COLACE Take 1 capsule (100 mg total) by mouth 2 (two) times daily.   glycopyrrolate 2 MG tablet Commonly known as: ROBINUL Take 1 tablet (2 mg total) by mouth 3 (three) times daily as needed.   metoCLOPramide 10 MG tablet Commonly known as: REGLAN Take 1 tablet (10 mg total) by mouth every 6 (six) hours as needed for nausea or vomiting.   ondansetron 4 MG disintegrating tablet Commonly known as: Zofran ODT Take 1 tablet (4 mg total) by mouth every 6 (six) hours.   pantoprazole 40  MG tablet Commonly known as: Protonix Take 1 tablet (40 mg total) by mouth 2 (two) times daily.   polyethylene glycol 17 g packet Commonly known as: MIRALAX / GLYCOLAX Take 17 g by mouth daily.   PrePLUS 27-1 MG Tabs Take 1 tablet by mouth daily.   scopolamine 1 MG/3DAYS Commonly known as: TRANSDERM-SCOP Place 1 patch (1.5 mg total) onto the skin every 3 (three) days.      Follow-up [facility] Follow up.   Specialty: Obstetrics and Gynecology Why: She has an appt today at 1:35 for her NOB visit Contact information: [address] [place], [place] [id number] [place] [address] [phone]          Signed: Emily Filbert 11/21/2019, 10:40 AM

## 2019-11-21 NOTE — Progress Notes (Signed)
Pt out  To taxi to have 1st clinic  visit

## 2019-11-21 NOTE — Progress Notes (Signed)
  Subjective:N&V persists    Julie Coleman is a [id number] [redacted]w[redacted]d being seen today for her first obstetrical visit.  Her obstetrical history is significant for hypeeremesis and IUGR. Patient does intend to breast feed. Pregnancy history fully reviewed.  Patient reports heartburn, nausea, vomiting and spitting.  Vitals:   11/21/19 1348  BP: 122/79  Pulse: 89  Weight: 173 lb 6.4 oz (78.7 kg)    HISTORY: OB History  Gravida Para Term Preterm AB Living  6 3 2 1 2 3   SAB TAB Ectopic Multiple Live Births  1 1     3     # Outcome Date GA Lbr Len/2nd Weight Sex Delivery Anes PTL Lv  6 Current           5 TAB 05/20/18          4 Preterm 02/16/16 [redacted]w[redacted]d    CS-Unspec     3 Term 05/22/12 [redacted]w[redacted]d    Vag-Spont     2 SAB 11/07/10          1 Term 05/29/10 [redacted]w[redacted]d    Vag-Spont      Past Medical History:  Diagnosis Date  . Asthma    albuteral inhaler  . Gestational diabetes   . Hypertension    Past Surgical History:  Procedure Laterality Date  . CESAREAN SECTION    . CHOLECYSTECTOMY     History reviewed. No pertinent family history.   Exam    Uterus:     Pelvic Exam:    Perineum: No Hemorrhoids   Vulva: normal   Vagina:  normal mucosa   pH:     Cervix: no lesions   Adnexa: not evaluated   Bony Pelvis: average  System:     Skin: normal coloration and turgor, no rashes    Neurologic: oriented, normal mood   Extremities: normal strength, tone, and muscle mass   HEENT extra ocular movement intact   Mouth/Teeth     Neck supple   Cardiovascular: regular rate and rhythm       Abdomen: gravid   Urinary: urethral meatus normal      Assessment:    Pregnancy: [id number] Patient Active Problem List   Diagnosis Date Noted  . Supervision of high risk pregnancy, antepartum 11/21/2019  . Hyperemesis 11/20/2019  . IUGR (intrauterine growth restriction) affecting care of mother 11/20/2019  . Hyperemesis affecting pregnancy, antepartum 11/13/2019  . Rubella non-immune status, antepartum  11/12/2019  . Cannabis hyperemesis syndrome concurrent with and due to cannabis abuse ([place]) 11/09/2019  . Nausea and vomiting in pregnancy 11/08/2019  . Heartburn during pregnancy, antepartum 10/25/2019  . Trichomonal vaginitis during pregnancy 10/22/2019  . Anemia affecting pregnancy 10/22/2019        Plan:     Initial labs drawn. Transferred from [place] Prenatal vitamins. Problem list reviewed and updated. Marland Kitchen  Ultrasound discussed; fetal survey: results reviewed.  Follow up in 2 weeks. MFM BPP weekly, growth [place] in 2 weeks 50% of 30 min visit spent on counseling and coordination of care.  Continue meds as Rx, avoid marijuana   Emeterio Reeve 11/21/2019

## 2019-11-21 NOTE — Patient Instructions (Signed)
Preterm Labor and Birth Information °Pregnancy normally lasts 39-41 weeks. Preterm labor is when labor starts early. It starts before you have been pregnant for 37 whole weeks. °What are the risk factors for preterm labor? °Preterm labor is more likely to occur in women who: °· Have an infection while pregnant. °· Have a cervix that is short. °· Have gone into preterm labor before. °· Have had surgery on their cervix. °· Are younger than age 28. °· Are older than age 35. °· Are African American. °· Are pregnant with two or more babies. °· Take street drugs while pregnant. °· Smoke while pregnant. °· Do not gain enough weight while pregnant. °· Got pregnant right after another pregnancy. °What are the symptoms of preterm labor? °Symptoms of preterm labor include: °· Cramps. The cramps may feel like the cramps some women get during their period. The cramps may happen with watery poop (diarrhea). °· Pain in the belly (abdomen). °· Pain in the lower back. °· Regular contractions or tightening. It may feel like your belly is getting tighter. °· Pressure in the lower belly that seems to get stronger. °· More fluid (discharge) leaking from the vagina. The fluid may be watery or bloody. °· Water breaking. °Why is it important to notice signs of preterm labor? °Babies who are born early may not be fully developed. They have a higher chance for: °· Long-term heart problems. °· Long-term lung problems. °· Trouble controlling body systems, like breathing. °· Bleeding in the brain. °· A condition called cerebral palsy. °· Learning difficulties. °· Death. °These risks are highest for babies who are born before 34 weeks of pregnancy. °How is preterm labor treated? °Treatment depends on: °· How long you were pregnant. °· Your condition. °· The health of your baby. °Treatment may involve: °· Having a stitch (suture) placed in your cervix. When you give birth, your cervix opens so the baby can come out. The stitch keeps the cervix  from opening too soon. °· Staying at the hospital. °· Taking or getting medicines, such as: °? Hormone medicines. °? Medicines to stop contractions. °? Medicines to help the baby’s lungs develop. °? Medicines to prevent your baby from having cerebral palsy. °What should I do if I am in preterm labor? °If you think you are going into labor too soon, call your doctor right away. °How can I prevent preterm labor? °· Do not use any tobacco products. °? Examples of these are cigarettes, chewing tobacco, and e-cigarettes. °? If you need help quitting, ask your doctor. °· Do not use street drugs. °· Do not use any medicines unless you ask your doctor if they are safe for you. °· Talk with your doctor before taking any herbal supplements. °· Make sure you gain enough weight. °· Watch for infection. If you think you might have an infection, get it checked right away. °· If you have gone into preterm labor before, tell your doctor. °This information is not intended to replace advice given to you by your health care provider. Make sure you discuss any questions you have with your health care provider. °Document Released: 03/04/2009 Document Revised: 03/30/2019 Document Reviewed: 04/28/2016 °Elsevier Patient Education © 2020 [employer] Inc. ° °

## 2019-11-21 NOTE — Progress Notes (Signed)
RN informed CSW that patient needs assistance with transportation to appointment this afternoon.   CSW spoke with CSW Seth Bake) at [facility] who agreed to assist patient after appointment if needed.   CSW met with patient at bedside and informed patient that CSW will be assisting with transportation to appointment this afternoon via taxi. CSW informed patient that patient will need to catch the bus home after appointment if transportation home is needed. Patient reported that she just moved here and doesn't know her way around. CSW informed patient about how to ride the bus. CSW informed patient that there is a Education officer, museum named Seth Bake available if additional assistance is needed after the appointment. Patient verbalized understanding and thanked CSW.   CSW provided taxi voucher to RN. RN agreed to call taxi to coordinate transportation for patient.   CSW signing off, no additional interventions needed at this time.  Abundio Miu, [place] Worker [facility] Cell#: [phone]

## 2019-11-22 ENCOUNTER — Other Ambulatory Visit: Payer: Self-pay | Admitting: *Deleted

## 2019-11-22 ENCOUNTER — Other Ambulatory Visit: Payer: Self-pay

## 2019-11-22 ENCOUNTER — Inpatient Hospital Stay (HOSPITAL_COMMUNITY)
Admission: AD | Admit: 2019-11-22 | Discharge: 2019-11-22 | Disposition: A | Discharge status: Home / Self Care | Payer: Medicaid Other | Attending: Obstetrics & Gynecology | Admitting: Obstetrics & Gynecology

## 2019-11-22 ENCOUNTER — Inpatient Hospital Stay (HOSPITAL_BASED_OUTPATIENT_CLINIC_OR_DEPARTMENT_OTHER): Payer: Medicaid Other

## 2019-11-22 DIAGNOSIS — O365930 Maternal care for other known or suspected poor fetal growth, third trimester, not applicable or unspecified: Secondary | ICD-10-CM

## 2019-11-22 DIAGNOSIS — O99283 Endocrine, nutritional and metabolic diseases complicating pregnancy, third trimester: Secondary | ICD-10-CM | POA: Diagnosis not present

## 2019-11-22 DIAGNOSIS — O210 Mild hyperemesis gravidarum: Secondary | ICD-10-CM

## 2019-11-22 DIAGNOSIS — Z3A32 32 weeks gestation of pregnancy: Secondary | ICD-10-CM

## 2019-11-22 DIAGNOSIS — Z3689 Encounter for other specified antenatal screening: Secondary | ICD-10-CM | POA: Diagnosis present

## 2019-11-22 DIAGNOSIS — J45909 Unspecified asthma, uncomplicated: Secondary | ICD-10-CM | POA: Insufficient documentation

## 2019-11-22 DIAGNOSIS — Z8632 Personal history of gestational diabetes: Secondary | ICD-10-CM | POA: Diagnosis not present

## 2019-11-22 DIAGNOSIS — O368130 Decreased fetal movements, third trimester, not applicable or unspecified: Secondary | ICD-10-CM

## 2019-11-22 DIAGNOSIS — O0990 Supervision of high risk pregnancy, unspecified, unspecified trimester: Secondary | ICD-10-CM

## 2019-11-22 DIAGNOSIS — O163 Unspecified maternal hypertension, third trimester: Secondary | ICD-10-CM | POA: Insufficient documentation

## 2019-11-22 DIAGNOSIS — O34219 Maternal care for unspecified type scar from previous cesarean delivery: Secondary | ICD-10-CM | POA: Diagnosis not present

## 2019-11-22 DIAGNOSIS — Z283 Underimmunization status: Secondary | ICD-10-CM

## 2019-11-22 DIAGNOSIS — Z9049 Acquired absence of other specified parts of digestive tract: Secondary | ICD-10-CM | POA: Insufficient documentation

## 2019-11-22 DIAGNOSIS — O99891 Other specified diseases and conditions complicating pregnancy: Secondary | ICD-10-CM | POA: Diagnosis not present

## 2019-11-22 DIAGNOSIS — Z888 Allergy status to other drugs, medicaments and biological substances status: Secondary | ICD-10-CM | POA: Diagnosis not present

## 2019-11-22 DIAGNOSIS — O0993 Supervision of high risk pregnancy, unspecified, third trimester: Secondary | ICD-10-CM | POA: Diagnosis not present

## 2019-11-22 DIAGNOSIS — O99513 Diseases of the respiratory system complicating pregnancy, third trimester: Secondary | ICD-10-CM | POA: Insufficient documentation

## 2019-11-22 DIAGNOSIS — O368190 Decreased fetal movements, unspecified trimester, not applicable or unspecified: Secondary | ICD-10-CM

## 2019-11-22 DIAGNOSIS — E876 Hypokalemia: Secondary | ICD-10-CM | POA: Insufficient documentation

## 2019-11-22 DIAGNOSIS — Z79899 Other long term (current) drug therapy: Secondary | ICD-10-CM | POA: Diagnosis not present

## 2019-11-22 DIAGNOSIS — O09899 Supervision of other high risk pregnancies, unspecified trimester: Secondary | ICD-10-CM

## 2019-11-22 LAB — BASIC METABOLIC PANEL
Anion gap: 13 (ref 5–15)
BUN: <5 mg/dL — ABNORMAL LOW (ref 6–20)
CO2: 24 mmol/L (ref 22–32)
Calcium: 8.9 mg/dL (ref 8.9–10.3)
Chloride: 101 mmol/L (ref 98–111)
Creatinine, Ser: 0.65 mg/dL (ref 0.44–1.00)
GFR calc Af Amer: >60 mL/min (ref >60)
GFR calc non Af Amer: >60 mL/min (ref >60)
Glucose, Bld: 91 mg/dL (ref 70–99)
Potassium: 3.1 mmol/L — ABNORMAL LOW (ref 3.5–5.1)
Sodium: 138 mmol/L (ref 135–145)

## 2019-11-22 LAB — URINALYSIS, ROUTINE W REFLEX MICROSCOPIC
Bilirubin Urine: NEGATIVE
Glucose, UA: NEGATIVE mg/dL
Hgb urine dipstick: NEGATIVE
Ketones, ur: 20 mg/dL — AB
Leukocytes,Ua: NEGATIVE
Nitrite: NEGATIVE
Protein, ur: 100 mg/dL — AB
Specific Gravity, Urine: 1.025 (ref 1.005–1.030)
pH: 7 (ref 5.0–8.0)

## 2019-11-22 LAB — CBC
HCT: 30.4 % — ABNORMAL LOW (ref 36.0–46.0)
Hemoglobin: 9.9 g/dL — ABNORMAL LOW (ref 12.0–15.0)
MCH: 28.3 pg (ref 26.0–34.0)
MCHC: 32.6 g/dL (ref 30.0–36.0)
MCV: 86.9 fL (ref 80.0–100.0)
Platelets: 237 10*3/uL (ref 150–400)
RBC: 3.5 MIL/uL — ABNORMAL LOW (ref 3.87–5.11)
RDW: 20.8 % — ABNORMAL HIGH (ref 11.5–15.5)
WBC: 9.1 10*3/uL (ref 4.0–10.5)
nRBC: 0 % (ref 0.0–0.2)

## 2019-11-22 LAB — MAGNESIUM: Magnesium: 1.9 mg/dL (ref 1.7–2.4)

## 2019-11-22 LAB — TSH: TSH: 0.898 u[IU]/mL (ref 0.350–4.500)

## 2019-11-22 MED ORDER — SODIUM CHLORIDE 0.9 % IV SOLN
8.0000 mg | Freq: Once | INTRAVENOUS | Status: AC
  Administered 2019-11-22: 8 mg via INTRAVENOUS
  Filled 2019-11-22: qty 4

## 2019-11-22 MED ORDER — LACTATED RINGERS IV BOLUS
1000.0000 mL | Freq: Once | INTRAVENOUS | Status: AC
  Administered 2019-11-22: 1000 mL via INTRAVENOUS

## 2019-11-22 MED ORDER — GLYCOPYRROLATE 1 MG PO TABS: 1.0000 mg | ORAL_TABLET | Freq: Three times a day (TID) | ORAL | 1 refills | Status: DC

## 2019-11-22 MED ORDER — SCOPOLAMINE 1 MG/3DAYS TD PT72: 1.0000 | MEDICATED_PATCH | TRANSDERMAL | 12 refills | Status: DC

## 2019-11-22 MED ORDER — GLYCOPYRROLATE 0.2 MG/ML IJ SOLN
0.1000 mg | Freq: Once | INTRAMUSCULAR | Status: AC
  Administered 2019-11-22: 0.1 mg via INTRAVENOUS
  Filled 2019-11-22: qty 1

## 2019-11-22 MED ORDER — FAMOTIDINE IN NACL 20-0.9 MG/50ML-% IV SOLN
20.0000 mg | Freq: Once | INTRAVENOUS | Status: AC
  Administered 2019-11-22: 20 mg via INTRAVENOUS
  Filled 2019-11-22: qty 50

## 2019-11-22 MED ORDER — ONDANSETRON 8 MG PO TBDP: 8.0000 mg | ORAL_TABLET | Freq: Three times a day (TID) | ORAL | 1 refills | Status: DC | PRN

## 2019-11-22 MED ORDER — PANTOPRAZOLE SODIUM 40 MG PO TBEC: 40.0000 mg | DELAYED_RELEASE_TABLET | Freq: Two times a day (BID) | ORAL | 1 refills | Status: DC

## 2019-11-22 MED ORDER — METOCLOPRAMIDE HCL 5 MG/ML IJ SOLN
10.0000 mg | Freq: Once | INTRAMUSCULAR | Status: AC
  Administered 2019-11-22: 10 mg via INTRAVENOUS
  Filled 2019-11-22: qty 2

## 2019-11-22 MED ORDER — SCOPOLAMINE 1 MG/3DAYS TD PT72
1.0000 | MEDICATED_PATCH | TRANSDERMAL | Status: DC
  Administered 2019-11-22: 1.5 mg via TRANSDERMAL
  Filled 2019-11-22: qty 1

## 2019-11-22 MED ORDER — GLYCOPYRROLATE 2 MG PO TABS: 2.0000 mg | ORAL_TABLET | Freq: Three times a day (TID) | ORAL | 1 refills | Status: DC

## 2019-11-22 MED ORDER — ONDANSETRON 4 MG PO TBDP: 4.0000 mg | ORAL_TABLET | Freq: Three times a day (TID) | ORAL | 1 refills | Status: DC | PRN

## 2019-11-22 MED FILL — PANTOPRAZOLE SOD DR 40 MG T: 40 | 30 days supply | Qty: 60 | Fill #0

## 2019-11-22 MED FILL — TRANSDERM-SCOP 1.5 MG/72HR: 1 | 10 days supply | Qty: 10 | Fill #0

## 2019-11-22 MED FILL — ONDANSETRON ODT 4 MG TABLET: 4 | 20 days supply | Qty: 60 | Fill #0

## 2019-11-22 MED FILL — GLYCOPYRROLATE 1 MG TABLET: 1 | 20 days supply | Qty: 60 | Fill #0

## 2019-11-22 NOTE — MAU Note (Signed)
Pt refusing EFM at this time.  Allowed RN to listen to [facility] x1 since she c/o DFM.  Will allow for EFM after she gets nausea medication.

## 2019-11-22 NOTE — MAU Note (Signed)
[facility] came to speak with patient about her concerns leaving the hospital.  [facility] explained to patient that she has been discharged by the attending physician and that there is not medical reason to keep her in the hospital.  Patient requesting the phone number for "the city" to call and complain.  Patient refused to sign for discharge.  Patient then asked for paper scrubs and something to drink before she left and seemed more relaxed and okay with being discharge.  Pt confirms she has her medications with her to take home.

## 2019-11-22 NOTE — MAU Note (Signed)
Pt arrived to MAU by EMS for vomiting.  Was dc'd home from [facility] yesterday and states she has been throwing up since she's been home.  No pain, but states not feeling baby move since yesterday.

## 2019-11-22 NOTE — Discharge Instructions (Signed)
Hyperemesis Gravidarum °Hyperemesis gravidarum is a severe form of nausea and vomiting that happens during pregnancy. Hyperemesis is worse than morning sickness. It may cause you to have nausea or vomiting all day for many days. It may keep you from eating and drinking enough food and liquids, which can lead to dehydration, malnutrition, and weight loss. Hyperemesis usually occurs during the first half (the first 20 weeks) of pregnancy. It often goes away once a woman is in her second half of pregnancy. However, sometimes hyperemesis continues through an entire pregnancy. °What are the causes? °The cause of this condition is not known. It may be related to changes in chemicals (hormones) in the body during pregnancy, such as the high level of pregnancy hormone (human chorionic gonadotropin) or the increase in the female sex hormone (estrogen). °What are the signs or symptoms? °Symptoms of this condition include: °· Nausea that does not go away. °· Vomiting that does not allow you to keep any food down. °· Weight loss. °· Body fluid loss (dehydration). °· Having no desire to eat, or not liking food that you have previously enjoyed. °How is this diagnosed? °This condition may be diagnosed based on: °· A physical exam. °· Your medical history. °· Your symptoms. °· Blood tests. °· Urine tests. °How is this treated? °This condition is managed by controlling symptoms. This may include: °· Following an eating plan. This can help lessen nausea and vomiting. °· Taking prescription medicines. °An eating plan and medicines are often used together to help control symptoms. If medicines do not help relieve nausea and vomiting, you may need to receive fluids through an IV at the hospital. °Follow these instructions at home: °Eating and drinking ° °· Avoid the following: °? Drinking fluids with meals. Try not to drink anything during the 30 minutes before and after your meals. °? Drinking more than 1 cup of fluid at a  time. °? Eating foods that trigger your symptoms. These may include spicy foods, coffee, high-fat foods, very sweet foods, and acidic foods. °? Skipping meals. Nausea can be more intense on an empty stomach. If you cannot tolerate food, do not force it. Try sucking on ice chips or other frozen items and make up for missed calories later. °? Lying down within 2 hours after eating. °? Being exposed to environmental triggers. These may include food smells, smoky rooms, closed spaces, rooms with strong smells, warm or humid places, overly loud and noisy rooms, and rooms with motion or flickering lights. Try eating meals in a well-ventilated area that is free of strong smells. °? Quick and sudden changes in your movement. °? Taking iron pills and multivitamins that contain iron. If you take prescription iron pills, do not stop taking them unless your health care provider approves. °? Preparing food. The smell of food can spoil your appetite or trigger nausea. °· To help relieve your symptoms: °? Listen to your body. Everyone is different and has different preferences. Find what works best for you. °? Eat and drink slowly. °? Eat 5-6 small meals daily instead of 3 large meals. Eating small meals and snacks can help you avoid an empty stomach. °? In the morning, before getting out of bed, eat a couple of crackers to avoid moving around on an empty stomach. °? Try eating starchy foods as these are usually tolerated well. Examples include cereal, toast, bread, potatoes, pasta, rice, and pretzels. °? Include at least 1 serving of protein with your meals and snacks. Protein options include   lean meats, poultry, seafood, beans, nuts, nut butters, eggs, cheese, and yogurt. ? Try eating a protein-rich snack before bed. Examples of a protein-rick snack include cheese and crackers or a peanut butter sandwich made with 1 slice of whole-wheat bread and 1 tsp (5 g) of peanut butter. ? Eat or suck on things that have ginger in them.  It may help relieve nausea. Add  tsp ground ginger to hot tea or choose ginger tea. ? Try drinking 100% fruit juice or an electrolyte drink. An electrolyte drink contains sodium, potassium, and chloride. ? Drink fluids that are cold, clear, and carbonated or sour. Examples include lemonade, ginger ale, lemon-lime soda, ice water, and sparkling water. ? Brush your teeth or use a mouth rinse after meals. ? Talk with your health care provider about starting a supplement of vitamin B6. General instructions  Take over-the-counter and prescription medicines only as told by your health care provider.  Follow instructions from your health care provider about eating or drinking restrictions.  Continue to take your prenatal vitamins as told by your health care provider. If you are having trouble taking your prenatal vitamins, talk with your health care provider about different options.  Keep all follow-up and pre-birth (prenatal) visits as told by your health care provider. This is important. Contact a health care provider if:  You have pain in your abdomen.  You have a severe headache.  You have vision problems.  You are losing weight.  You feel weak or dizzy. Get help right away if:  You cannot drink fluids without vomiting.  You vomit blood.  You have constant nausea and vomiting.  You are very weak.  You faint.  You have a fever and your symptoms suddenly get worse. Summary  Hyperemesis gravidarum is a severe form of nausea and vomiting that happens during pregnancy.  Making some changes to your eating habits may help relieve nausea and vomiting.  This condition may be managed with medicine.  If medicines do not help relieve nausea and vomiting, you may need to receive fluids through an IV at the hospital. This information is not intended to replace advice given to you by your health care provider. Make sure you discuss any questions you have with your health care  provider. Document Released: 12/06/2005 Document Revised: 12/26/2017 Document Reviewed: 08/04/2016 Elsevier Patient Education  2020 [employer]   Fetal Movement Counts Patient Name: ________________________________________________ Patient Due Date: ____________________ What is a fetal movement count?  A fetal movement count is the number of times that you feel your baby move during a certain amount of time. This may also be called a fetal kick count. A fetal movement count is recommended for every pregnant woman. You may be asked to start counting fetal movements as early as week 28 of your pregnancy. Pay attention to when your baby is most active. You may notice your baby's sleep and wake cycles. You may also notice things that make your baby move more. You should do a fetal movement count:  When your baby is normally most active.  At the same time each day. A good time to count movements is while you are resting, after having something to eat and drink. How do I count fetal movements? 1. Find a quiet, comfortable area. Sit, or lie down on your side. 2. Write down the date, the start time and stop time, and the number of movements that you felt between those two times. Take this information with you to your  health care visits. 3. For 2 hours, count kicks, flutters, swishes, rolls, and jabs. You should feel at least 10 movements during 2 hours. 4. You may stop counting after you have felt 10 movements. 5. If you do not feel 10 movements in 2 hours, have something to eat and drink. Then, keep resting and counting for 1 hour. If you feel at least 4 movements during that hour, you may stop counting. Contact a health care provider if:  You feel fewer than 4 movements in 2 hours.  Your baby is not moving like he or she usually does. Date: ____________ Start time: ____________ Stop time: ____________ Movements: ____________ Date: ____________ Start time: ____________ Stop time: ____________  Movements: ____________ Date: ____________ Start time: ____________ Stop time: ____________ Movements: ____________ Date: ____________ Start time: ____________ Stop time: ____________ Movements: ____________ Date: ____________ Start time: ____________ Stop time: ____________ Movements: ____________ Date: ____________ Start time: ____________ Stop time: ____________ Movements: ____________ Date: ____________ Start time: ____________ Stop time: ____________ Movements: ____________ Date: ____________ Start time: ____________ Stop time: ____________ Movements: ____________ Date: ____________ Start time: ____________ Stop time: ____________ Movements: ____________ This information is not intended to replace advice given to you by your health care provider. Make sure you discuss any questions you have with your health care provider. Document Released: 01/05/2007 Document Revised: 12/26/2018 Document Reviewed: 01/15/2016 Elsevier Patient Education  2020 [employer].  Hypokalemia Hypokalemia means that the amount of potassium in the blood is lower than normal. Potassium is a chemical (electrolyte) that helps regulate the amount of fluid in the body. It also stimulates muscle tightening (contraction) and helps nerves work properly. Normally, most of the body's potassium is inside cells, and only a very small amount is in the blood. Because the amount in the blood is so small, minor changes to potassium levels in the blood can be life-threatening. What are the causes? This condition may be caused by:  Antibiotic medicine.  Diarrhea or vomiting. Taking too much of a medicine that helps you have a bowel movement (laxative) can cause diarrhea and lead to hypokalemia.  Chronic kidney disease (CKD).  Medicines that help the body get rid of excess fluid (diuretics).  Eating disorders, such as bulimia.  Low magnesium levels in the body.  Sweating a lot. What are the signs or symptoms? Symptoms of  this condition include:  Weakness.  Constipation.  Fatigue.  Muscle cramps.  Mental confusion.  Skipped heartbeats or irregular heartbeat (palpitations).  Tingling or numbness. How is this diagnosed? This condition is diagnosed with a blood test. How is this treated? This condition may be treated by:  Taking potassium supplements by mouth.  Adjusting the medicines that you take.  Eating more foods that contain a lot of potassium. If your potassium level is very low, you may need to get potassium through an IV and be monitored in the hospital. Follow these instructions at home:   Take over-the-counter and prescription medicines only as told by your health care provider. This includes vitamins and supplements.  Eat a healthy diet. A healthy diet includes fresh fruits and vegetables, whole grains, healthy fats, and lean proteins.  If instructed, eat more foods that contain a lot of potassium. This includes: ? Nuts, such as peanuts and pistachios. ? Seeds, such as sunflower seeds and pumpkin seeds. ? Peas, lentils, and lima beans. ? Whole grain and bran cereals and breads. ? Fresh fruits and vegetables, such as apricots, avocado, bananas, cantaloupe, kiwi, oranges, tomatoes, asparagus, and potatoes. ?  Orange juice. ? Tomato juice. ? Red meats. ? Yogurt.  Keep all follow-up visits as told by your health care provider. This is important. Contact a health care provider if you:  Have weakness that gets worse.  Feel your heart pounding or racing.  Vomit.  Have diarrhea.  Have diabetes (diabetes mellitus) and you have trouble keeping your blood sugar (glucose) in your target range. Get help right away if you:  Have chest pain.  Have shortness of breath.  Have vomiting or diarrhea that lasts for more than 2 days.  Faint. Summary  Hypokalemia means that the amount of potassium in the blood is lower than normal.  This condition is diagnosed with a blood  test.  Hypokalemia may be treated by taking potassium supplements, adjusting the medicines that you take, or eating more foods that are high in potassium.  If your potassium level is very low, you may need to get potassium through an IV and be monitored in the hospital. This information is not intended to replace advice given to you by your health care provider. Make sure you discuss any questions you have with your health care provider. Document Released: 12/06/2005 Document Revised: 07/19/2018 Document Reviewed: 07/19/2018 Elsevier Patient Education  2020 [employer].

## 2019-11-22 NOTE — MAU Provider Note (Addendum)
History     CSN: [id number]  Arrival date and time: 11/22/19 3762  Chief Complaint  Patient presents with  . Emesis   HPI   Julie Coleman is a 28 y.o. [id number] at [redacted]w[redacted]d, here for hyperemesis. This is her 10th visit to MAU since November and has been admitted 4 times for the same symptom since. Patient was discharged at 1pm from [facility] yesterday and was transported to [place] office with [place] assistance (taxi) for her first OB prenatal care with Dr. Roselie Awkward. No significant findings at the office per progress note. Patient reports she has been throwing up and nauseated and has not been able to eat or drink since the outpatient visit. She tried to take "some meds" at home last night but reports she vomited right away. Patient reports she was up "all night" last night vomiting. She denies trying to take any antiemetics today. When asked which medications she has at home, patient reports "I don't know". Patient has a scopolamine patch, applied during the hospitalization two days ago. Patient asked if she feels safe at home. She reports she does and her mother takes care of her children at home. Last bowel movement yesterday while hospitalized and reports no fetal movement felt since hospital discharge yesterday. Patient asked to use FHR monitor, patient defers.   Denies: vaginal discharge/bleeding, HA, chest pain, abd/pelvic pain, back pain, edema, fever, chills, diarrhea, dizziness  OB History    Gravida  6   Para  3   Term  2   Preterm  1   AB  2   Living  3     SAB  1   TAB  1   Ectopic      Multiple      Live Births  3           Past Medical History:  Diagnosis Date  . Asthma    albuteral inhaler  . Gestational diabetes   . Hypertension     Past Surgical History:  Procedure Laterality Date  . CESAREAN SECTION    . CHOLECYSTECTOMY      No family history on file.  Social History   Tobacco Use  . Smoking status: Never Smoker  . Smokeless tobacco: Never Used   Substance Use Topics  . Alcohol use: Not Currently  . Drug use: Yes    Types: Marijuana    Comment: used yesterday, 11/16/19    Allergies:  Allergies  Allergen Reactions  . Phenergan [Promethazine Hcl]     Tardive dyskinesia     Medications Prior to Admission  Medication Sig Dispense Refill Last Dose  . diphenhydrAMINE (BENADRYL) 25 mg capsule Take 1 capsule (25 mg total) by mouth every 6 (six) hours as needed (nausea). 30 capsule 0   . docusate sodium (COLACE) 100 MG capsule Take 1 capsule (100 mg total) by mouth 2 (two) times daily. 60 capsule 0   . glycopyrrolate (ROBINUL) 2 MG tablet Take 1 tablet (2 mg total) by mouth 3 (three) times daily as needed. 30 tablet 3   . metoCLOPramide (REGLAN) 10 MG tablet Take 1 tablet (10 mg total) by mouth every 6 (six) hours as needed for nausea or vomiting. 30 tablet 2   . ondansetron (ZOFRAN ODT) 4 MG disintegrating tablet Take 1 tablet (4 mg total) by mouth every 6 (six) hours. 30 tablet 2   . pantoprazole (PROTONIX) 40 MG tablet Take 1 tablet (40 mg total) by mouth 2 (two) times daily. 60 tablet  2   . polyethylene glycol (MIRALAX / GLYCOLAX) 17 g packet Take 17 g by mouth daily. 14 each 0   . Prenatal Vit-Fe Fumarate-FA (PREPLUS) 27-1 MG TABS Take 1 tablet by mouth daily. 30 tablet 13   . scopolamine (TRANSDERM-SCOP) 1 MG/3DAYS Place 1 patch (1.5 mg total) onto the skin every 3 (three) days. 10 patch 12     Review of Systems  Constitutional: Negative.   HENT: Negative.   Cardiovascular: Negative.   Gastrointestinal: Positive for nausea and vomiting. Negative for abdominal pain and constipation.  Genitourinary: Negative.   Musculoskeletal: Negative.   Neurological: Negative.    Physical Exam   Blood pressure 125/72, pulse 78, temperature 98 F (36.7 C), temperature source Oral, resp. rate 18, last menstrual period 04/07/2019, SpO2 98 %.  Physical Exam  Nursing note and vitals reviewed. Constitutional: She is oriented to person,  place, and time. She appears well-developed and well-nourished. She appears distressed (spitting into emesis bag).  Cardiovascular: Normal rate.  Respiratory: Effort normal.  GI: Soft.  Neurological: She is alert and oriented to person, place, and time.  Skin: Skin is warm and dry. No erythema.  Psychiatric: She has a normal mood and affect.  FHT : 135 bpm baseline, moderate variables, +accels, -decel  MAU Course  Procedures - All UDS performed during this pregnancy positive for THC (11/02, 11/12, 11/19).  Results for orders placed or performed during the hospital encounter of 11/22/19 (from the past 24 hour(s))  Basic metabolic panel     Status: Abnormal   Collection Time: 11/22/19  9:10 AM  Result Value Ref Range   Sodium 138 135 - 145 mmol/L   Potassium 3.1 (L) 3.5 - 5.1 mmol/L   Chloride 101 98 - 111 mmol/L   CO2 24 22 - 32 mmol/L   Glucose, Bld 91 70 - 99 mg/dL   BUN <5 (L) 6 - 20 mg/dL   Creatinine, Ser 9.600.65 0.44 - 1.00 mg/dL   Calcium 8.9 8.9 - 45.410.3 mg/dL   GFR calc non Af Amer >60 >60 mL/min   GFR calc Af Amer >60 >60 mL/min   Anion gap 13 5 - 15  CBC     Status: Abnormal   Collection Time: 11/22/19  9:10 AM  Result Value Ref Range   WBC 9.1 4.0 - 10.5 K/uL   RBC 3.50 (L) 3.87 - 5.11 MIL/uL   Hemoglobin 9.9 (L) 12.0 - 15.0 g/dL   HCT 09.830.4 (L) 11.936.0 - 14.746.0 %   MCV 86.9 80.0 - 100.0 fL   MCH 28.3 26.0 - 34.0 pg   MCHC 32.6 30.0 - 36.0 g/dL   RDW 82.920.8 (H) 56.211.5 - 13.015.5 %   Platelets 237 150 - 400 K/uL   nRBC 0.0 0.0 - 0.2 %  TSH     Status: None   Collection Time: 11/22/19  9:10 AM  Result Value Ref Range   TSH 0.898 0.350 - 4.500 uIU/mL  Magnesium     Status: None   Collection Time: 11/22/19  9:10 AM  Result Value Ref Range   Magnesium 1.9 1.7 - 2.4 mg/dL   BPP 8/8, normal AFI  MDM 09:00: at bedside, patient spitting into an emesis bag throughout the exam but no vomiting observed by the student. Patient reports she cannot move or have toco or FHR monitors  attached until patient receivs some medications.  09:30 : Robinul, LR, and Zofran given 10:10 : patient agrees for FHR monitor 10:30, IV pepcid given 11:30  CNM at bedside. Patient reports she does not have any medications at home. Patient reports she cannot afford the copays and states "I'm not ready to go home, I need to be admitted". No vomiting observed while at MAU and patient has been constantly spitting into an emesis bag throughout her stay here so far. Patient is hemodynamically stable and requests food. No significant findings from her lab results today. Potassium decreased from 3.7 on 12/01 to 3.1 today. Will discharge with PO potassium. Meds to beds pharmacy consult and regular diet ordered. Patient shares with her nurse that she can feel some fetal movements now. Eduard Clos, PA student 11/22/19 1200 1155: Pt c/o continued pain in her esophagus, hurts to drink. Explained likely d/t irritation from chronic emesis. Will recommend continuing Protonix. BPP pending. Pt requesting to see another provider because "yall aren't understanding me, you're just trying to push me out of here". Pt seen spitting into emesis bag, no vomiting witnessed, small amt of clear liquid in emesis bag.  1430: Dr. Marice Potter in to see pt, explained there's no medical indication for admission, plan for meds to beds delivery of her home meds, and discharge home.   1510: Pt tolerated 3 cups of water and gingerale. Meds delivered from pharmacy. Pt upset stating "I'm not ready to go, I'm not ready to get on the bus". States "I need to speak with someone else". Discussed again with pt there's no indication for admission. Discussed with pt the need for a regular regimen of meds, would likely need to take them around the clock, and improvement of sx won't happen instantaneously. House supervisor in to speak with pt about concerns.   Meds given: Zofran  IV Reglan 10 mg IV Robinul 1 mg IV Pepcid 20 mg IV Scopolamine patch LR  x2L Assessment and Plan  [redacted] weeks gestation HEG Reactive NST Hypokalemia Decreased FM Discharge home Follow up at [facility] as scheduled Message sent to [facility] to schedule weekly BPP/dopplers per MFM reccomendations Return precautions Rx Zofran Rx Protonix Rx Scopolamine Rx Robinul- all meds handed to pt by pharmacy  Allergies as of 11/22/2019      Reactions   Phenergan [promethazine Hcl]    Tardive dyskinesia       Medication List    STOP taking these medications   metoCLOPramide 10 MG tablet Commonly known as: REGLAN     TAKE these medications   diphenhydrAMINE 25 mg capsule Commonly known as: BENADRYL Take 1 capsule (25 mg total) by mouth every 6 (six) hours as needed (nausea).   docusate sodium 100 MG capsule Commonly known as: COLACE Take 1 capsule (100 mg total) by mouth 2 (two) times daily.   glycopyrrolate 1 MG tablet Commonly known as: ROBINUL Take 1 tablet (1 mg total) by mouth 3 (three) times daily. What changed:   medication strength  how much to take  when to take this  reasons to take this   ondansetron 4 MG disintegrating tablet Commonly known as: Zofran ODT Take 1 tablet (4 mg total) by mouth every 8 (eight) hours as needed for nausea or vomiting. What changed:   when to take this  reasons to take this   pantoprazole 40 MG tablet Commonly known as: Protonix Take 1 tablet (40 mg total) by mouth 2 (two) times daily.   polyethylene glycol 17 g packet Commonly known as: MIRALAX / GLYCOLAX Take 17 g by mouth daily.   PrePLUS 27-1 MG Tabs Take 1 tablet by mouth daily.  scopolamine 1 MG/3DAYS Commonly known as: TRANSDERM-SCOP Place 1 patch (1.5 mg total) onto the skin every 3 (three) days.      Donette Larry, CNM  11/22/2019 1:47 PM

## 2019-11-23 ENCOUNTER — Telehealth: Payer: Self-pay | Admitting: *Deleted

## 2019-11-23 LAB — CULTURE, OB URINE

## 2019-11-23 NOTE — Telephone Encounter (Signed)
Called pt and informed her of dates and time for [place] next week - 12/11 @ 0730. Pt voiced understanding and agreed to appt.

## 2019-11-29 ENCOUNTER — Inpatient Hospital Stay (HOSPITAL_COMMUNITY)
Admission: AD | Admit: 2019-11-29 | Discharge: 2019-11-30 | Disposition: A | Discharge status: Home / Self Care | Payer: Medicaid Other | Attending: Obstetrics & Gynecology | Admitting: Obstetrics & Gynecology

## 2019-11-29 ENCOUNTER — Other Ambulatory Visit: Payer: Self-pay

## 2019-11-29 ENCOUNTER — Encounter (HOSPITAL_COMMUNITY): Payer: Self-pay | Admitting: Obstetrics & Gynecology

## 2019-11-29 DIAGNOSIS — O210 Mild hyperemesis gravidarum: Secondary | ICD-10-CM | POA: Diagnosis not present

## 2019-11-29 DIAGNOSIS — O212 Late vomiting of pregnancy: Secondary | ICD-10-CM | POA: Diagnosis not present

## 2019-11-29 DIAGNOSIS — O4703 False labor before 37 completed weeks of gestation, third trimester: Secondary | ICD-10-CM | POA: Diagnosis not present

## 2019-11-29 DIAGNOSIS — Z3A33 33 weeks gestation of pregnancy: Secondary | ICD-10-CM

## 2019-11-29 DIAGNOSIS — O368130 Decreased fetal movements, third trimester, not applicable or unspecified: Secondary | ICD-10-CM | POA: Diagnosis not present

## 2019-11-29 LAB — PROTEIN / CREATININE RATIO, URINE
Creatinine, Urine: 358.08 mg/dL
Protein Creatinine Ratio: 0.14 mg/mg{Cre} (ref 0.00–0.15)
Total Protein, Urine: 50 mg/dL

## 2019-11-29 LAB — URINALYSIS, ROUTINE W REFLEX MICROSCOPIC
Bilirubin Urine: NEGATIVE
Glucose, UA: NEGATIVE mg/dL
Hgb urine dipstick: NEGATIVE
Ketones, ur: 80 mg/dL — AB
Leukocytes,Ua: NEGATIVE
Nitrite: NEGATIVE
Protein, ur: 100 mg/dL — AB
Specific Gravity, Urine: 1.026 (ref 1.005–1.030)
pH: 6 (ref 5.0–8.0)

## 2019-11-29 LAB — CBC
HCT: 29.4 % — ABNORMAL LOW (ref 36.0–46.0)
Hemoglobin: 10.1 g/dL — ABNORMAL LOW (ref 12.0–15.0)
MCH: 29.9 pg (ref 26.0–34.0)
MCHC: 34.4 g/dL (ref 30.0–36.0)
MCV: 87 fL (ref 80.0–100.0)
Platelets: 159 10*3/uL (ref 150–400)
RBC: 3.38 MIL/uL — ABNORMAL LOW (ref 3.87–5.11)
RDW: 21.4 % — ABNORMAL HIGH (ref 11.5–15.5)
WBC: 9.6 10*3/uL (ref 4.0–10.5)
nRBC: 0 % (ref 0.0–0.2)

## 2019-11-29 LAB — COMPREHENSIVE METABOLIC PANEL
ALT: 65 U/L — ABNORMAL HIGH (ref 0–44)
AST: 40 U/L (ref 15–41)
Albumin: 3 g/dL — ABNORMAL LOW (ref 3.5–5.0)
Alkaline Phosphatase: 67 U/L (ref 38–126)
Anion gap: 12 (ref 5–15)
BUN: 5 mg/dL — ABNORMAL LOW (ref 6–20)
CO2: 23 mmol/L (ref 22–32)
Calcium: 8.8 mg/dL — ABNORMAL LOW (ref 8.9–10.3)
Chloride: 102 mmol/L (ref 98–111)
Creatinine, Ser: 0.69 mg/dL (ref 0.44–1.00)
GFR calc Af Amer: >60 mL/min (ref >60)
GFR calc non Af Amer: >60 mL/min (ref >60)
Glucose, Bld: 94 mg/dL (ref 70–99)
Potassium: 3.1 mmol/L — ABNORMAL LOW (ref 3.5–5.1)
Sodium: 137 mmol/L (ref 135–145)
Total Bilirubin: 0.8 mg/dL (ref 0.3–1.2)
Total Protein: 6 g/dL — ABNORMAL LOW (ref 6.5–8.1)

## 2019-11-29 LAB — FETAL FIBRONECTIN: Fetal Fibronectin: NEGATIVE

## 2019-11-29 MED ORDER — DIPHENHYDRAMINE HCL 50 MG/ML IJ SOLN
25.0000 mg | Freq: Once | INTRAMUSCULAR | Status: AC
  Administered 2019-11-29: 25 mg via INTRAVENOUS
  Filled 2019-11-29: qty 1

## 2019-11-29 MED ORDER — THIAMINE HCL 100 MG/ML IJ SOLN: INTRAVENOUS | Status: DC

## 2019-11-29 MED ORDER — POTASSIUM CHLORIDE 20 MEQ PO PACK
40.0000 meq | PACK | Freq: Once | ORAL | Status: AC
  Administered 2019-11-29: 40 meq via ORAL
  Filled 2019-11-29: qty 2

## 2019-11-29 MED ORDER — METOCLOPRAMIDE HCL 5 MG/ML IJ SOLN
10.0000 mg | Freq: Once | INTRAMUSCULAR | Status: AC
  Administered 2019-11-29: 10 mg via INTRAVENOUS
  Filled 2019-11-29: qty 2

## 2019-11-29 MED ORDER — THIAMINE HCL 100 MG/ML IJ SOLN
Freq: Once | INTRAVENOUS | Status: AC
  Administered 2019-11-29: 23:00:00 via INTRAVENOUS
  Filled 2019-11-29: qty 1000

## 2019-11-29 MED ORDER — LACTATED RINGERS IV BOLUS
1000.0000 mL | Freq: Once | INTRAVENOUS | Status: AC
  Administered 2019-11-29: 1000 mL via INTRAVENOUS

## 2019-11-29 MED ORDER — THIAMINE HCL 100 MG/ML IJ SOLN: 100.0000 mg | Freq: Once | INTRAMUSCULAR | Status: DC

## 2019-11-29 MED ORDER — SODIUM CHLORIDE 0.9 % IV SOLN
8.0000 mg | Freq: Once | INTRAVENOUS | Status: AC
  Administered 2019-11-29: 8 mg via INTRAVENOUS
  Filled 2019-11-29: qty 4

## 2019-11-29 NOTE — MAU Note (Signed)
Patient reports to MAU via EMS c/o ctx every 5 min lasting for 110min. No bleeding or LOF. No FM since yesterday 11/28/19.

## 2019-11-29 NOTE — MAU Provider Note (Addendum)
CC:  Chief Complaint  Patient presents with  . Vomiting  . Contractions  . Decreased Fetal Movement     First Provider Initiated Contact with Patient 11/29/19 2024      HPI: Julie Coleman is a 28 y.o. year old [id number] female at [redacted]w[redacted]d weeks gestation who presents to MAU by EMS reporting contractions every 5 minutes, pelvic pressure, no fetal mvmt since yesterday and exacerbation of hyperemesis. Has not taken a Zofran in a few days.   Associated Sx:  Vaginal bleeding: Denies Leaking of fluid: Denies Fetal movement: No fetal mvmt since yesterday  O:  Patient Vitals for the past 24 hrs:  BP Temp Temp src Pulse Weight  11/29/19 2139 (!) 103/58 -- -- 70 --  11/29/19 2009 (!) 143/74 (!) 97.1 F (36.2 C) Oral 96 74.7 kg    General: NAD Heart: Regular rate Lungs: Normal rate and effort Abd: Soft, NT, Gravid, S=D Pelvic: NEFG, Neg LOF, neg blood.   Cervix closed/long, soft  EFM: 130, Moderate variability, 15 x 15 accelerations, no decelerations Toco: Contractions Rare, mild  Results for orders placed or performed during the hospital encounter of 11/29/19 (from the past 24 hour(s))  Urinalysis, Routine w reflex microscopic     Status: Abnormal   Collection Time: 11/29/19  7:57 PM  Result Value Ref Range   Color, Urine AMBER (A) YELLOW   APPearance HAZY (A) CLEAR   Specific Gravity, Urine 1.026 1.005 - 1.030   pH 6.0 5.0 - 8.0   Glucose, UA NEGATIVE NEGATIVE mg/dL   Hgb urine dipstick NEGATIVE NEGATIVE   Bilirubin Urine NEGATIVE NEGATIVE   Ketones, ur 80 (A) NEGATIVE mg/dL   Protein, ur 100 (A) NEGATIVE mg/dL   Nitrite NEGATIVE NEGATIVE   Leukocytes,Ua NEGATIVE NEGATIVE   RBC / HPF 0-5 0 - 5 RBC/hpf   WBC, UA 0-5 0 - 5 WBC/hpf   Bacteria, UA FEW (A) NONE SEEN   Squamous Epithelial / LPF 6-10 0 - 5   Mucus PRESENT   Fetal fibronectin     Status: None   Collection Time: 11/29/19  8:24 PM  Result Value Ref Range   Fetal Fibronectin NEGATIVE NEGATIVE  Protein / creatinine  ratio, urine     Status: None   Collection Time: 11/29/19  8:40 PM  Result Value Ref Range   Creatinine, Urine 358.08 mg/dL   Total Protein, Urine 50 mg/dL   Protein Creatinine Ratio 0.14 0.00 - 0.15 mg/mg[Cre]  CBC     Status: Abnormal   Collection Time: 11/29/19  9:04 PM  Result Value Ref Range   WBC 9.6 4.0 - 10.5 K/uL   RBC 3.38 (L) 3.87 - 5.11 MIL/uL   Hemoglobin 10.1 (L) 12.0 - 15.0 g/dL   HCT 29.4 (L) 36.0 - 46.0 %   MCV 87.0 80.0 - 100.0 fL   MCH 29.9 26.0 - 34.0 pg   MCHC 34.4 30.0 - 36.0 g/dL   RDW 21.4 (H) 11.5 - 15.5 %   Platelets 159 150 - 400 K/uL   nRBC 0.0 0.0 - 0.2 %  Comprehensive metabolic panel     Status: Abnormal   Collection Time: 11/29/19  9:04 PM  Result Value Ref Range   Sodium 137 135 - 145 mmol/L   Potassium 3.1 (L) 3.5 - 5.1 mmol/L   Chloride 102 98 - 111 mmol/L   CO2 23 22 - 32 mmol/L   Glucose, Bld 94 70 - 99 mg/dL   BUN 5 (L) 6 -  20 mg/dL   Creatinine, Ser 4.97 0.44 - 1.00 mg/dL   Calcium 8.8 (L) 8.9 - 10.3 mg/dL   Total Protein 6.0 (L) 6.5 - 8.1 g/dL   Albumin 3.0 (L) 3.5 - 5.0 g/dL   AST 40 15 - 41 U/L   ALT 65 (H) 0 - 44 U/L   Alkaline Phosphatase 67 38 - 126 U/L   Total Bilirubin 0.8 0.3 - 1.2 mg/dL   GFR calc non Af Amer >60 >60 mL/min   GFR calc Af Amer >60 >60 mL/min   Anion gap 12 5 - 15     Orders Placed This Encounter  Procedures  . Urinalysis, Routine w reflex microscopic  . Fetal fibronectin   Meds ordered this encounter  Medications  . lactated ringers bolus 1,000 mL  . ondansetron (ZOFRAN) 8 mg in sodium chloride 0.9 % 50 mL IVPB  . diphenhydrAMINE (BENADRYL) injection 25 mg    Care of pt turned over [facility] at 2200. Second bag IV fluids ordered.   [place], [place], CNM 11/29/2019 10:18 PM   OB FELLOW ADDENDUM  Assumed care of patient as noted above. On my interview patient reported she had felt fetal movement, NST is reactive (baselin 140, moderate variability, +accles x2, no decels, toco w/o  contractions). Again reported pelvic pain/contractions, on recheck of cervix she was C/L/H (per verbal report from prior provider she had also checked her with same exam), no concern for preterm labor. BP's have normalized since last check, mildly elevated LFTs however likely secondary to mild dehydration. Per discussion with current RN caring for patient she has not noted any emesis for several hours and has now completed second bag of IVF. Patient reporting ongoing nausea.   Reviewed with her no indication for admission at present as there is no unique medication/therapy available with admission and she has recently been discharged with meds to beds with therapies that have been effective in the past, current plan is to discharge her to home. Patient stated she did not feel it was safe to be discharged the way she was feeling. We reviewed that FM has increased and NST is reactive, her VS are stable, and that there is no medical indication at present based on her labs for admission and she has received adequate treatment during her stay. She requested to speak with someone regarding her discharge, called house coverage to come speak with patient, plan remains to discharge patient to home as no medical indication for admission at present.   Zack Seal, MD/[facility] OB Fellow  11/30/2019, 12:02 AM

## 2019-11-30 ENCOUNTER — Ambulatory Visit (HOSPITAL_COMMUNITY): Payer: Medicaid Other

## 2019-11-30 ENCOUNTER — Ambulatory Visit (HOSPITAL_COMMUNITY)
Admission: RE | Admit: 2019-11-30 | Payer: Medicaid Other | Source: Ambulatory Visit | Attending: Obstetrics & Gynecology | Admitting: Obstetrics & Gynecology

## 2019-11-30 NOTE — Discharge Instructions (Signed)
Hyperemesis Gravidarum °Hyperemesis gravidarum is a severe form of nausea and vomiting that happens during pregnancy. Hyperemesis is worse than morning sickness. It may cause you to have nausea or vomiting all day for many days. It may keep you from eating and drinking enough food and liquids, which can lead to dehydration, malnutrition, and weight loss. Hyperemesis usually occurs during the first half (the first 20 weeks) of pregnancy. It often goes away once a woman is in her second half of pregnancy. However, sometimes hyperemesis continues through an entire pregnancy. °What are the causes? °The cause of this condition is not known. It may be related to changes in chemicals (hormones) in the body during pregnancy, such as the high level of pregnancy hormone (human chorionic gonadotropin) or the increase in the female sex hormone (estrogen). °What are the signs or symptoms? °Symptoms of this condition include: °· Nausea that does not go away. °· Vomiting that does not allow you to keep any food down. °· Weight loss. °· Body fluid loss (dehydration). °· Having no desire to eat, or not liking food that you have previously enjoyed. °How is this diagnosed? °This condition may be diagnosed based on: °· A physical exam. °· Your medical history. °· Your symptoms. °· Blood tests. °· Urine tests. °How is this treated? °This condition is managed by controlling symptoms. This may include: °· Following an eating plan. This can help lessen nausea and vomiting. °· Taking prescription medicines. °An eating plan and medicines are often used together to help control symptoms. If medicines do not help relieve nausea and vomiting, you may need to receive fluids through an IV at the hospital. °Follow these instructions at home: °Eating and drinking ° °· Avoid the following: °? Drinking fluids with meals. Try not to drink anything during the 30 minutes before and after your meals. °? Drinking more than 1 cup of fluid at a  time. °? Eating foods that trigger your symptoms. These may include spicy foods, coffee, high-fat foods, very sweet foods, and acidic foods. °? Skipping meals. Nausea can be more intense on an empty stomach. If you cannot tolerate food, do not force it. Try sucking on ice chips or other frozen items and make up for missed calories later. °? Lying down within 2 hours after eating. °? Being exposed to environmental triggers. These may include food smells, smoky rooms, closed spaces, rooms with strong smells, warm or humid places, overly loud and noisy rooms, and rooms with motion or flickering lights. Try eating meals in a well-ventilated area that is free of strong smells. °? Quick and sudden changes in your movement. °? Taking iron pills and multivitamins that contain iron. If you take prescription iron pills, do not stop taking them unless your health care provider approves. °? Preparing food. The smell of food can spoil your appetite or trigger nausea. °· To help relieve your symptoms: °? Listen to your body. Everyone is different and has different preferences. Find what works best for you. °? Eat and drink slowly. °? Eat 5-6 small meals daily instead of 3 large meals. Eating small meals and snacks can help you avoid an empty stomach. °? In the morning, before getting out of bed, eat a couple of crackers to avoid moving around on an empty stomach. °? Try eating starchy foods as these are usually tolerated well. Examples include cereal, toast, bread, potatoes, pasta, rice, and pretzels. °? Include at least 1 serving of protein with your meals and snacks. Protein options include   lean meats, poultry, seafood, beans, nuts, nut butters, eggs, cheese, and yogurt. °? Try eating a protein-rich snack before bed. Examples of a protein-rick snack include cheese and crackers or a peanut butter sandwich made with 1 slice of whole-wheat bread and 1 tsp (5 g) of peanut butter. °? Eat or suck on things that have ginger in them.  It may help relieve nausea. Add ¼ tsp ground ginger to hot tea or choose ginger tea. °? Try drinking 100% fruit juice or an electrolyte drink. An electrolyte drink contains sodium, potassium, and chloride. °? Drink fluids that are cold, clear, and carbonated or sour. Examples include lemonade, ginger ale, lemon-lime soda, ice water, and sparkling water. °? Brush your teeth or use a mouth rinse after meals. °? Talk with your health care provider about starting a supplement of vitamin B6. °General instructions °· Take over-the-counter and prescription medicines only as told by your health care provider. °· Follow instructions from your health care provider about eating or drinking restrictions. °· Continue to take your prenatal vitamins as told by your health care provider. If you are having trouble taking your prenatal vitamins, talk with your health care provider about different options. °· Keep all follow-up and pre-birth (prenatal) visits as told by your health care provider. This is important. °Contact a health care provider if: °· You have pain in your abdomen. °· You have a severe headache. °· You have vision problems. °· You are losing weight. °· You feel weak or dizzy. °Get help right away if: °· You cannot drink fluids without vomiting. °· You vomit blood. °· You have constant nausea and vomiting. °· You are very weak. °· You faint. °· You have a fever and your symptoms suddenly get worse. °Summary °· Hyperemesis gravidarum is a severe form of nausea and vomiting that happens during pregnancy. °· Making some changes to your eating habits may help relieve nausea and vomiting. °· This condition may be managed with medicine. °· If medicines do not help relieve nausea and vomiting, you may need to receive fluids through an IV at the hospital. °This information is not intended to replace advice given to you by your health care provider. Make sure you discuss any questions you have with your health care  provider. °Document Released: 12/06/2005 Document Revised: 12/26/2017 Document Reviewed: 08/04/2016 °Elsevier Patient Education © 2020 [employer] ° ° °

## 2019-11-30 NOTE — MAU Note (Signed)
Dr. Dione Plover came to the bedside to d/c patient. Patient stated she did not agree with the plan of care. Dr. Dione Plover stated to the patient that he and V.Reith CNM agreed to this plan of care. Patient wanted to talk to someone "higher up." [facility] notified and came to the patients bedside to talk to the patient. Patient reports she wanted a second opinion. AC reiterated that the MD and CNM determined there was no medical indication for admission at this time. Patient requested a way to get home. AC notified and will provide patient with a taxi voucher. Patient requested sprite before discharge and willingly signed the discharge pad.

## 2019-11-30 NOTE — Progress Notes (Signed)
Called by MAU staff to speak with patient regarding her disagreement with current and previous provider's plan for discharge.  Explained that the provider must be willing to admit the patient and they could not be forced to do so.  Pt still not satisfied with information and asked to speak to someone else.  I advised that there isn't anyone else to speak with regarding this decision.  I explained the trespass situation if she refused to leave.  Staff RN asked for taxi voucher which I provided given the hour.

## 2019-12-04 ENCOUNTER — Encounter: Payer: Medicaid Other | Admitting: Family Medicine

## 2019-12-04 ENCOUNTER — Other Ambulatory Visit: Payer: Self-pay

## 2019-12-04 ENCOUNTER — Ambulatory Visit (INDEPENDENT_AMBULATORY_CARE_PROVIDER_SITE_OTHER): Payer: Medicaid Other | Admitting: Family Medicine

## 2019-12-04 VITALS — BP 110/71 | HR 99 | Wt 173.9 lb

## 2019-12-04 DIAGNOSIS — Z3A34 34 weeks gestation of pregnancy: Secondary | ICD-10-CM

## 2019-12-04 DIAGNOSIS — O0993 Supervision of high risk pregnancy, unspecified, third trimester: Secondary | ICD-10-CM

## 2019-12-04 DIAGNOSIS — O368130 Decreased fetal movements, third trimester, not applicable or unspecified: Secondary | ICD-10-CM

## 2019-12-04 DIAGNOSIS — O365930 Maternal care for other known or suspected poor fetal growth, third trimester, not applicable or unspecified: Secondary | ICD-10-CM | POA: Diagnosis not present

## 2019-12-04 DIAGNOSIS — O0990 Supervision of high risk pregnancy, unspecified, unspecified trimester: Secondary | ICD-10-CM

## 2019-12-04 DIAGNOSIS — O26899 Other specified pregnancy related conditions, unspecified trimester: Secondary | ICD-10-CM

## 2019-12-04 DIAGNOSIS — O26893 Other specified pregnancy related conditions, third trimester: Secondary | ICD-10-CM

## 2019-12-04 DIAGNOSIS — R12 Heartburn: Secondary | ICD-10-CM

## 2019-12-04 MED ORDER — FLEET ENEMA 7-19 GM/118ML RE ENEM: 1.0000 | ENEMA | Freq: Every day | RECTAL | 5 refills | Status: DC | PRN

## 2019-12-04 NOTE — Patient Instructions (Signed)

## 2019-12-04 NOTE — Progress Notes (Signed)
     PRENATAL VISIT NOTE  Subjective:  Julie Coleman is a 28 y.o. [phone] at [redacted]w[redacted]d being seen today for ongoing prenatal care.  She is currently monitored for the following issues for this high-risk pregnancy and has Trichomonal vaginitis during pregnancy; Anemia affecting pregnancy; Heartburn during pregnancy, antepartum; Nausea and vomiting in pregnancy; Cannabis hyperemesis syndrome concurrent with and due to cannabis abuse ([place]); Rubella non-immune status, antepartum; Hyperemesis affecting pregnancy, antepartum; Hyperemesis; IUGR (intrauterine growth restriction) affecting care of mother; and Supervision of high risk pregnancy, antepartum on their problem list.  Patient reports backache and constipation.  Contractions: Irritability. Vag. Bleeding: None.  Movement: (!) Decreased. Denies leaking of fluid.   The following portions of the patient's history were reviewed and updated as appropriate: allergies, current medications, past family history, past medical history, past social history, past surgical history and problem list.   Objective:   Vitals:   12/04/19 1118  BP: 110/71  Pulse: 99  Weight: 173 lb 14.4 oz (78.9 kg)    Fetal Status: Fetal Heart Rate (bpm): 139   Movement: (!) Decreased     General:  Alert, oriented and cooperative. Patient is in no acute distress.  Skin: Skin is warm and dry. No rash noted.   Cardiovascular: Normal heart rate noted  Respiratory: Normal respiratory effort, no problems with respiration noted  Abdomen: Soft, gravid, appropriate for gestational age.  Pain/Pressure: Present     Pelvic: Cervical exam deferred        Extremities: Normal range of motion.  Edema: None  Mental Status: Normal mood and affect. Normal behavior. Normal judgment and thought content.   Assessment and Plan:  Pregnancy: [id number] at [redacted]w[redacted]d 1. Supervision of high risk pregnancy, antepartum Has not gotten sugar testing, and will need, if can keep it down.   2. Decreased fetal  movements in third trimester, single or unspecified fetus NST:  Baseline: 130 bpm, Variability: Good {> 6 bpm), Accelerations: Reactive and Decelerations: Absent - Fetal nonstress test  3. Poor fetal growth affecting management of mother in third trimester, single or unspecified fetus F/u doppler and testing with MFM 12/18  4. Constipation in pregnancy - sodium phosphate (FLEET) 7-19 GM/118ML ENEM; Place 133 mLs (1 enema total) rectally daily as needed for severe constipation.  Dispense: 133 mL; Refill: 5  Preterm labor symptoms and general obstetric precautions including but not limited to vaginal bleeding, contractions, leaking of fluid and fetal movement were reviewed in detail with the patient. Please refer to After Visit Summary for other counseling recommendations.   Return in 2 weeks (on 12/18/2019) for in person with cultures.  Future Appointments  Date Time Provider [place]  12/07/2019 10:30 AM [place] [facility]  12/07/2019 10:30 AM [facility] [place] 5 [facility]  12/19/2019  1:35 PM Aletha Halim, MD [facility]    Donnamae Jude, MD

## 2019-12-04 NOTE — Progress Notes (Signed)
Pt reports blood in urine. Pt reports constipation that is causing pain 8/10; reports last BM 2-3 weeks ago. Pt is taking Colace daily and Miralax BID, started taking Miralax TID yesterday.  Apolonio Schneiders RN   12/04/19

## 2019-12-06 ENCOUNTER — Encounter: Payer: Self-pay | Admitting: General Practice

## 2019-12-07 ENCOUNTER — Ambulatory Visit (HOSPITAL_COMMUNITY): Admission: RE | Admit: 2019-12-07 | Payer: Medicaid Other | Source: Ambulatory Visit

## 2019-12-07 ENCOUNTER — Ambulatory Visit (HOSPITAL_COMMUNITY): Payer: Medicaid Other

## 2019-12-10 ENCOUNTER — Encounter (HOSPITAL_COMMUNITY): Payer: Self-pay | Admitting: Family Medicine

## 2019-12-10 ENCOUNTER — Other Ambulatory Visit: Payer: Self-pay

## 2019-12-10 ENCOUNTER — Inpatient Hospital Stay (HOSPITAL_COMMUNITY)
Admission: AD | Admit: 2019-12-10 | Discharge: 2019-12-10 | Disposition: A | Discharge status: Home / Self Care | Payer: Medicaid Other | Attending: Family Medicine | Admitting: Family Medicine

## 2019-12-10 DIAGNOSIS — A5901 Trichomonal vulvovaginitis: Secondary | ICD-10-CM | POA: Insufficient documentation

## 2019-12-10 DIAGNOSIS — O34219 Maternal care for unspecified type scar from previous cesarean delivery: Secondary | ICD-10-CM | POA: Diagnosis not present

## 2019-12-10 DIAGNOSIS — O98513 Other viral diseases complicating pregnancy, third trimester: Secondary | ICD-10-CM | POA: Diagnosis not present

## 2019-12-10 DIAGNOSIS — O365930 Maternal care for other known or suspected poor fetal growth, third trimester, not applicable or unspecified: Secondary | ICD-10-CM | POA: Insufficient documentation

## 2019-12-10 DIAGNOSIS — D649 Anemia, unspecified: Secondary | ICD-10-CM | POA: Diagnosis not present

## 2019-12-10 DIAGNOSIS — Z79899 Other long term (current) drug therapy: Secondary | ICD-10-CM | POA: Diagnosis not present

## 2019-12-10 DIAGNOSIS — O0993 Supervision of high risk pregnancy, unspecified, third trimester: Secondary | ICD-10-CM | POA: Insufficient documentation

## 2019-12-10 DIAGNOSIS — O210 Mild hyperemesis gravidarum: Secondary | ICD-10-CM | POA: Diagnosis not present

## 2019-12-10 DIAGNOSIS — Z3A35 35 weeks gestation of pregnancy: Secondary | ICD-10-CM | POA: Insufficient documentation

## 2019-12-10 DIAGNOSIS — Z283 Underimmunization status: Secondary | ICD-10-CM

## 2019-12-10 DIAGNOSIS — O99891 Other specified diseases and conditions complicating pregnancy: Secondary | ICD-10-CM | POA: Insufficient documentation

## 2019-12-10 DIAGNOSIS — B069 Rubella without complication: Secondary | ICD-10-CM | POA: Insufficient documentation

## 2019-12-10 DIAGNOSIS — R102 Pelvic and perineal pain: Secondary | ICD-10-CM | POA: Insufficient documentation

## 2019-12-10 DIAGNOSIS — O163 Unspecified maternal hypertension, third trimester: Secondary | ICD-10-CM | POA: Insufficient documentation

## 2019-12-10 DIAGNOSIS — Z8632 Personal history of gestational diabetes: Secondary | ICD-10-CM | POA: Insufficient documentation

## 2019-12-10 DIAGNOSIS — Z3689 Encounter for other specified antenatal screening: Secondary | ICD-10-CM | POA: Diagnosis not present

## 2019-12-10 DIAGNOSIS — O99513 Diseases of the respiratory system complicating pregnancy, third trimester: Secondary | ICD-10-CM | POA: Diagnosis not present

## 2019-12-10 DIAGNOSIS — O26853 Spotting complicating pregnancy, third trimester: Secondary | ICD-10-CM | POA: Diagnosis not present

## 2019-12-10 DIAGNOSIS — Z9049 Acquired absence of other specified parts of digestive tract: Secondary | ICD-10-CM | POA: Insufficient documentation

## 2019-12-10 DIAGNOSIS — O98313 Other infections with a predominantly sexual mode of transmission complicating pregnancy, third trimester: Secondary | ICD-10-CM | POA: Diagnosis not present

## 2019-12-10 DIAGNOSIS — O99013 Anemia complicating pregnancy, third trimester: Secondary | ICD-10-CM | POA: Diagnosis not present

## 2019-12-10 DIAGNOSIS — O211 Hyperemesis gravidarum with metabolic disturbance: Secondary | ICD-10-CM | POA: Diagnosis not present

## 2019-12-10 DIAGNOSIS — O23593 Infection of other part of genital tract in pregnancy, third trimester: Secondary | ICD-10-CM

## 2019-12-10 DIAGNOSIS — Z888 Allergy status to other drugs, medicaments and biological substances status: Secondary | ICD-10-CM | POA: Insufficient documentation

## 2019-12-10 DIAGNOSIS — J45909 Unspecified asthma, uncomplicated: Secondary | ICD-10-CM | POA: Diagnosis not present

## 2019-12-10 DIAGNOSIS — E876 Hypokalemia: Secondary | ICD-10-CM

## 2019-12-10 DIAGNOSIS — O26859 Spotting complicating pregnancy, unspecified trimester: Secondary | ICD-10-CM

## 2019-12-10 DIAGNOSIS — O26899 Other specified pregnancy related conditions, unspecified trimester: Secondary | ICD-10-CM

## 2019-12-10 DIAGNOSIS — O0990 Supervision of high risk pregnancy, unspecified, unspecified trimester: Secondary | ICD-10-CM

## 2019-12-10 DIAGNOSIS — O09899 Supervision of other high risk pregnancies, unspecified trimester: Secondary | ICD-10-CM

## 2019-12-10 LAB — COMPREHENSIVE METABOLIC PANEL
ALT: 26 U/L (ref 0–44)
AST: 17 U/L (ref 15–41)
Albumin: 2.9 g/dL — ABNORMAL LOW (ref 3.5–5.0)
Alkaline Phosphatase: 75 U/L (ref 38–126)
Anion gap: 10 (ref 5–15)
BUN: <5 mg/dL — ABNORMAL LOW (ref 6–20)
CO2: 27 mmol/L (ref 22–32)
Calcium: 8.7 mg/dL — ABNORMAL LOW (ref 8.9–10.3)
Chloride: 101 mmol/L (ref 98–111)
Creatinine, Ser: 0.69 mg/dL (ref 0.44–1.00)
GFR calc Af Amer: >60 mL/min (ref >60)
GFR calc non Af Amer: >60 mL/min (ref >60)
Glucose, Bld: 77 mg/dL (ref 70–99)
Potassium: 3.1 mmol/L — ABNORMAL LOW (ref 3.5–5.1)
Sodium: 138 mmol/L (ref 135–145)
Total Bilirubin: 0.3 mg/dL (ref 0.3–1.2)
Total Protein: 5.7 g/dL — ABNORMAL LOW (ref 6.5–8.1)

## 2019-12-10 LAB — URINALYSIS, ROUTINE W REFLEX MICROSCOPIC
Bilirubin Urine: NEGATIVE
Glucose, UA: NEGATIVE mg/dL
Hgb urine dipstick: NEGATIVE
Ketones, ur: 20 mg/dL — AB
Leukocytes,Ua: NEGATIVE
Nitrite: NEGATIVE
Protein, ur: 100 mg/dL — AB
Specific Gravity, Urine: 1.023 (ref 1.005–1.030)
pH: 7 (ref 5.0–8.0)

## 2019-12-10 LAB — CBC WITH DIFFERENTIAL/PLATELET
Abs Immature Granulocytes: 0.06 10*3/uL (ref 0.00–0.07)
Basophils Absolute: 0 10*3/uL (ref 0.0–0.1)
Basophils Relative: 0 %
Eosinophils Absolute: 0 10*3/uL (ref 0.0–0.5)
Eosinophils Relative: 1 %
HCT: 29.1 % — ABNORMAL LOW (ref 36.0–46.0)
Hemoglobin: 9.9 g/dL — ABNORMAL LOW (ref 12.0–15.0)
Immature Granulocytes: 1 %
Lymphocytes Relative: 15 %
Lymphs Abs: 1.1 10*3/uL (ref 0.7–4.0)
MCH: 29.3 pg (ref 26.0–34.0)
MCHC: 34 g/dL (ref 30.0–36.0)
MCV: 86.1 fL (ref 80.0–100.0)
Monocytes Absolute: 0.5 10*3/uL (ref 0.1–1.0)
Monocytes Relative: 7 %
Neutro Abs: 5.9 10*3/uL (ref 1.7–7.7)
Neutrophils Relative %: 76 %
Platelets: 163 10*3/uL (ref 150–400)
RBC: 3.38 MIL/uL — ABNORMAL LOW (ref 3.87–5.11)
RDW: 20.5 % — ABNORMAL HIGH (ref 11.5–15.5)
WBC: 7.6 10*3/uL (ref 4.0–10.5)
nRBC: 0 % (ref 0.0–0.2)

## 2019-12-10 LAB — WET PREP, GENITAL
Clue Cells Wet Prep HPF POC: NONE SEEN
Sperm: NONE SEEN
Trich, Wet Prep: NONE SEEN
Yeast Wet Prep HPF POC: NONE SEEN

## 2019-12-10 MED ORDER — FERROUS SULFATE 325 (65 FE) MG PO TABS: 325.0000 mg | ORAL_TABLET | Freq: Every day | ORAL | 3 refills | Status: DC

## 2019-12-10 MED ORDER — POTASSIUM CHLORIDE ER 10 MEQ PO TBCR: 10.0000 meq | EXTENDED_RELEASE_TABLET | Freq: Every day | ORAL | 0 refills | Status: DC

## 2019-12-10 MED ORDER — DIPHENHYDRAMINE HCL 50 MG/ML IJ SOLN
12.5000 mg | Freq: Once | INTRAMUSCULAR | Status: AC
  Administered 2019-12-10: 12.5 mg via INTRAVENOUS
  Filled 2019-12-10: qty 1

## 2019-12-10 MED ORDER — GLYCOPYRROLATE 1 MG PO TABS
1.0000 mg | ORAL_TABLET | Freq: Once | ORAL | Status: AC
  Administered 2019-12-10: 1 mg via ORAL
  Filled 2019-12-10: qty 1

## 2019-12-10 MED ORDER — PANTOPRAZOLE SODIUM 40 MG IV SOLR
40.0000 mg | Freq: Once | INTRAVENOUS | Status: AC
  Administered 2019-12-10: 40 mg via INTRAVENOUS
  Filled 2019-12-10: qty 40

## 2019-12-10 MED ORDER — LACTATED RINGERS IV BOLUS
1000.0000 mL | Freq: Once | INTRAVENOUS | Status: AC
  Administered 2019-12-10: 1000 mL via INTRAVENOUS

## 2019-12-10 MED ORDER — METOCLOPRAMIDE HCL 5 MG/ML IJ SOLN
10.0000 mg | Freq: Once | INTRAMUSCULAR | Status: AC
  Administered 2019-12-10: 15:00:00 10 mg via INTRAVENOUS
  Filled 2019-12-10: qty 2

## 2019-12-10 MED FILL — POTASSIUM CHLORIDE CRYS ER: 10 | 5 days supply | Qty: 5 | Fill #0

## 2019-12-10 NOTE — Discharge Instructions (Signed)
Abdominal Pain During Pregnancy  Abdominal pain is common during pregnancy, and has many possible causes. Some causes are more serious than others, and sometimes the cause is not known. Abdominal pain can be a sign that labor is starting. It can also be caused by normal growth and stretching of muscles and ligaments during pregnancy. Always tell your health care provider if you have any abdominal pain. Follow these instructions at home:  Do not have sex or put anything in your vagina until your pain goes away completely.  Get plenty of rest until your pain improves.  Drink enough fluid to keep your urine pale yellow.  Take over-the-counter and prescription medicines only as told by your health care provider.  Keep all follow-up visits as told by your health care provider. This is important. Contact a health care provider if:  Your pain continues or gets worse after resting.  You have lower abdominal pain that: ? Comes and goes at regular intervals. ? Spreads to your back. ? Is similar to menstrual cramps.  You have pain or burning when you urinate. Get help right away if:  You have a fever or chills.  You have vaginal bleeding.  You are leaking fluid from your vagina.  You are passing tissue from your vagina.  You have vomiting or diarrhea that lasts for more than 24 hours.  Your baby is moving less than usual.  You feel very weak or faint.  You have shortness of breath.  You develop severe pain in your upper abdomen. Summary  Abdominal pain is common during pregnancy, and has many possible causes.  If you experience abdominal pain during pregnancy, tell your health care provider right away.  Follow your health care provider's home care instructions and keep all follow-up visits as directed. This information is not intended to replace advice given to you by your health care provider. Make sure you discuss any questions you have with your health care  provider. Document Released: 12/06/2005 Document Revised: 03/26/2019 Document Reviewed: 03/10/2017 Elsevier Patient Education  2020 [employer]  Hyperemesis Gravidarum Hyperemesis gravidarum is a severe form of nausea and vomiting that happens during pregnancy. Hyperemesis is worse than morning sickness. It may cause you to have nausea or vomiting all day for many days. It may keep you from eating and drinking enough food and liquids, which can lead to dehydration, malnutrition, and weight loss. Hyperemesis usually occurs during the first half (the first 20 weeks) of pregnancy. It often goes away once a woman is in her second half of pregnancy. However, sometimes hyperemesis continues through an entire pregnancy. What are the causes? The cause of this condition is not known. It may be related to changes in chemicals (hormones) in the body during pregnancy, such as the high level of pregnancy hormone (human chorionic gonadotropin) or the increase in the female sex hormone (estrogen). What are the signs or symptoms? Symptoms of this condition include:  Nausea that does not go away.  Vomiting that does not allow you to keep any food down.  Weight loss.  Body fluid loss (dehydration).  Having no desire to eat, or not liking food that you have previously enjoyed. How is this diagnosed? This condition may be diagnosed based on:  A physical exam.  Your medical history.  Your symptoms.  Blood tests.  Urine tests. How is this treated? This condition is managed by controlling symptoms. This may include:  Following an eating plan. This can help lessen nausea and vomiting.  Taking prescription medicines. An eating plan and medicines are often used together to help control symptoms. If medicines do not help relieve nausea and vomiting, you may need to receive fluids through an IV at the hospital. Follow these instructions at home: Eating and drinking   Avoid the  following: ? Drinking fluids with meals. Try not to drink anything during the 30 minutes before and after your meals. ? Drinking more than 1 cup of fluid at a time. ? Eating foods that trigger your symptoms. These may include spicy foods, coffee, high-fat foods, very sweet foods, and acidic foods. ? Skipping meals. Nausea can be more intense on an empty stomach. If you cannot tolerate food, do not force it. Try sucking on ice chips or other frozen items and make up for missed calories later. ? Lying down within 2 hours after eating. ? Being exposed to environmental triggers. These may include food smells, smoky rooms, closed spaces, rooms with strong smells, warm or humid places, overly loud and noisy rooms, and rooms with motion or flickering lights. Try eating meals in a well-ventilated area that is free of strong smells. ? Quick and sudden changes in your movement. ? Taking iron pills and multivitamins that contain iron. If you take prescription iron pills, do not stop taking them unless your health care provider approves. ? Preparing food. The smell of food can spoil your appetite or trigger nausea.  To help relieve your symptoms: ? Listen to your body. Everyone is different and has different preferences. Find what works best for you. ? Eat and drink slowly. ? Eat 5-6 small meals daily instead of 3 large meals. Eating small meals and snacks can help you avoid an empty stomach. ? In the morning, before getting out of bed, eat a couple of crackers to avoid moving around on an empty stomach. ? Try eating starchy foods as these are usually tolerated well. Examples include cereal, toast, bread, potatoes, pasta, rice, and pretzels. ? Include at least 1 serving of protein with your meals and snacks. Protein options include lean meats, poultry, seafood, beans, nuts, nut butters, eggs, cheese, and yogurt. ? Try eating a protein-rich snack before bed. Examples of a protein-rick snack include cheese and  crackers or a peanut butter sandwich made with 1 slice of whole-wheat bread and 1 tsp (5 g) of peanut butter. ? Eat or suck on things that have ginger in them. It may help relieve nausea. Add  tsp ground ginger to hot tea or choose ginger tea. ? Try drinking 100% fruit juice or an electrolyte drink. An electrolyte drink contains sodium, potassium, and chloride. ? Drink fluids that are cold, clear, and carbonated or sour. Examples include lemonade, ginger ale, lemon-lime soda, ice water, and sparkling water. ? Brush your teeth or use a mouth rinse after meals. ? Talk with your health care provider about starting a supplement of vitamin B6. General instructions  Take over-the-counter and prescription medicines only as told by your health care provider.  Follow instructions from your health care provider about eating or drinking restrictions.  Continue to take your prenatal vitamins as told by your health care provider. If you are having trouble taking your prenatal vitamins, talk with your health care provider about different options.  Keep all follow-up and pre-birth (prenatal) visits as told by your health care provider. This is important. Contact a health care provider if:  You have pain in your abdomen.  You have a severe headache.  You have vision  problems.  You are losing weight.  You feel weak or dizzy. Get help right away if:  You cannot drink fluids without vomiting.  You vomit blood.  You have constant nausea and vomiting.  You are very weak.  You faint.  You have a fever and your symptoms suddenly get worse. Summary  Hyperemesis gravidarum is a severe form of nausea and vomiting that happens during pregnancy.  Making some changes to your eating habits may help relieve nausea and vomiting.  This condition may be managed with medicine.  If medicines do not help relieve nausea and vomiting, you may need to receive fluids through an IV at the hospital. This  information is not intended to replace advice given to you by your health care provider. Make sure you discuss any questions you have with your health care provider. Document Released: 12/06/2005 Document Revised: 12/26/2017 Document Reviewed: 08/04/2016 Elsevier Patient Education  [employer].  Vaginal Bleeding During Pregnancy, Third Trimester  A small amount of bleeding from the vagina (spotting) is relatively common during pregnancy. Various things can cause bleeding or spotting during pregnancy. Sometimes bleeding is normal and is not a problem. However, bleeding during the third trimester can also be a sign of something serious for the mother and the baby. Be sure to tell your health care provider about any vaginal bleeding right away. Some possible causes of vaginal bleeding during the third trimester include:  Infection or growths (polyps) on the cervix.  A condition in which the placenta partially or completely covers the opening of the cervix inside the uterus (placenta previa).  The placenta separating from the uterus (placenta abruption).  The start of labor (discharging of the mucus plug).  A condition in which the placenta grows into the muscle layer of the uterus (placenta accreta). Follow these instructions at home: Activity  Follow instructions from your health care provider about limiting your activity. If your health care provider recommends activity restriction, you may need to stay in bed and only get up to use the bathroom. In some cases, your health care provider may allow you to continue light activity.  If needed, make plans for someone to help with your regular activities.  Ask your health care provider if it is safe for you to drive.  Do not lift anything that is heavier than 10 lb (4.5 kg), or the limit that your health care provider tells you, until he or she says that it is safe.  Do not have sex or orgasms until your health care provider says that  this is safe. Medicines  Take over-the-counter and prescription medicines only as told by your health care provider.  Do not take aspirin because it can cause bleeding. General instructions  Pay attention to any changes in your symptoms.  Write down how many pads you use each day, how often you change pads, and how soaked (saturated) they are.  Do not use tampons or douche.  If you pass any tissue from your vagina, save the tissue so you can show it to your health care provider.  Keep all follow-up visits as told by your health care provider. This is important. Contact a health care provider if:  You have vaginal bleeding during any part of your pregnancy.  You have cramps or labor pains.  You have a fever. Get help right away if:  You have severe cramps or pain in your back or abdomen.  You have a gush of fluid from the vagina.  You pass large clots or a large amount of tissue from your vagina.  Your bleeding increases.  You feel light-headed or weak.  You faint.  You feel that your baby is moving less than usual, or not moving at all. Summary  Various things can cause bleeding or spotting in pregnancy.  Bleeding during the third trimester can be a sign of a serious problem for the mother and the baby.  Be sure to tell your health care provider about any vaginal bleeding right away. This information is not intended to replace advice given to you by your health care provider. Make sure you discuss any questions you have with your health care provider. Document Released: 02/26/2003 Document Revised: 03/27/2019 Document Reviewed: 03/10/2017 Elsevier Patient Education  2020 [employer].  Preterm Labor and Birth Information  The normal length of a pregnancy is 39-41 weeks. Preterm labor is when labor starts before 37 completed weeks of pregnancy. What are the risk factors for preterm labor? Preterm labor is more likely to occur in women who:  Have certain  infections during pregnancy such as a bladder infection, sexually transmitted infection, or infection inside the uterus (chorioamnionitis).  Have a shorter-than-normal cervix.  Have gone into preterm labor before.  Have had surgery on their cervix.  Are younger than age 61 or older than age 43.  Are African American.  Are pregnant with twins or multiple babies (multiple gestation).  Take street drugs or smoke while pregnant.  Do not gain enough weight while pregnant.  Became pregnant shortly after having been pregnant. What are the symptoms of preterm labor? Symptoms of preterm labor include:  Cramps similar to those that can happen during a menstrual period. The cramps may happen with diarrhea.  Pain in the abdomen or lower back.  Regular uterine contractions that may feel like tightening of the abdomen.  A feeling of increased pressure in the pelvis.  Increased watery or bloody mucus discharge from the vagina.  Water breaking (ruptured amniotic sac). Why is it important to recognize signs of preterm labor? It is important to recognize signs of preterm labor because babies who are born prematurely may not be fully developed. This can put them at an increased risk for:  Long-term (chronic) heart and lung problems.  Difficulty immediately after birth with regulating body systems, including blood sugar, body temperature, heart rate, and breathing rate.  Bleeding in the brain.  Cerebral palsy.  Learning difficulties.  Death. These risks are highest for babies who are born before 34 weeks of pregnancy. How is preterm labor treated? Treatment depends on the length of your pregnancy, your condition, and the health of your baby. It may involve:  Having a stitch (suture) placed in your cervix to prevent your cervix from opening too early (cerclage).  Taking or being given medicines, such as: ? Hormone medicines. These may be given early in pregnancy to help support the  pregnancy. ? Medicine to stop contractions. ? Medicines to help mature the baby's lungs. These may be prescribed if the risk of delivery is high. ? Medicines to prevent your baby from developing cerebral palsy. If the labor happens before 34 weeks of pregnancy, you may need to stay in the hospital. What should I do if I think I am in preterm labor? If you think that you are going into preterm labor, call your health care provider right away. How can I prevent preterm labor in future pregnancies? To increase your chance of having a full-term pregnancy:  Do not use any tobacco products, such as cigarettes, chewing tobacco, and e-cigarettes. If you need help quitting, ask your health care provider.  Do not use street drugs or medicines that have not been prescribed to you during your pregnancy.  Talk with your health care provider before taking any herbal supplements, even if you have been taking them regularly.  Make sure you gain a healthy amount of weight during your pregnancy.  Watch for infection. If you think that you might have an infection, get it checked right away.  Make sure to tell your health care provider if you have gone into preterm labor before. This information is not intended to replace advice given to you by your health care provider. Make sure you discuss any questions you have with your health care provider. Document Released: 02/26/2004 Document Revised: 03/30/2019 Document Reviewed: 04/28/2016 Elsevier Patient Education  2020 [employer].

## 2019-12-10 NOTE — MAU Note (Signed)
EMS arrival for n/v.  Reports that she has been throwing up brownish stuff the last few days.  Has ran out of zofran.  EMS did give her a dose en route.  Pt reportes poss contracting, ? One while with EMS. VSS. Blood sugar 86

## 2019-12-10 NOTE — MAU Provider Note (Addendum)
History     CSN: [id number]  Arrival date and time: 12/10/19 1325   First Provider Initiated Contact with Patient 12/10/19 1414      Chief Complaint  Patient presents with  . Nausea   [name] is a 28 y.o. [phone] [id number] at 1586w2d who presents to MAU for N/V.  Onset: last night around 900PM Location: stomach Duration: <24hrs Character: constant nausea, vomited x10-15 since 900PM last night Aggravating/Associated: warm water/cramping in pelvis Relieving: none Treatment: scopolamine patch, Zofran (given by EMS), Robinul (last took a couple of days ago because she ran out), Protonix (last took yesterday @12noon ), Reglan (last took 12noon yesterday)  Pt also reports spotting x1 week. Pt reports she told her doctor and had her cervix checked and everything was closed. Pt denies spotting today.  Pt denies LOF, ctx, decreased FM, vaginal discharge/odor/itching. Pt denies abdominal pain, constipation, diarrhea, or urinary problems. Pt denies fever, chills, fatigue, sweating or changes in appetite. Pt denies SOB or chest pain. Pt denies dizziness, HA, light-headedness, weakness.  Problems this pregnancy include: HEG, IUGR. Allergies? phenergan Current medications/supplements? Scopolamine, zofran, robinul, protonix, reglan, albuterol Prenatal care provider? [facility], next appt 12/19/2019   OB History    Gravida  6   Para  3   Term  2   Preterm  1   AB  2   Living  3     SAB  1   TAB  1   Ectopic      Multiple      Live Births  3           Past Medical History:  Diagnosis Date  . Asthma    albuteral inhaler  . Gestational diabetes   . Hypertension     Past Surgical History:  Procedure Laterality Date  . CESAREAN SECTION    . CHOLECYSTECTOMY      History reviewed. No pertinent family history.  Social History   Tobacco Use  . Smoking status: Never Smoker  . Smokeless tobacco: Never Used  Substance Use Topics  . Alcohol use: Not Currently  .  Drug use: Yes    Types: Marijuana    Comment: used yesterday, 11/16/19    Allergies:  Allergies  Allergen Reactions  . Phenergan [Promethazine Hcl]     Tardive dyskinesia     Medications Prior to Admission  Medication Sig Dispense Refill Last Dose  . glycopyrrolate (ROBINUL) 1 MG tablet Take 1 tablet (1 mg total) by mouth 3 (three) times daily. 60 tablet 1 Past Week at Unknown time  . ondansetron (ZOFRAN ODT) 4 MG disintegrating tablet Take 1 tablet (4 mg total) by mouth every 8 (eight) hours as needed for nausea or vomiting. 60 tablet 1 Past Month at Unknown time  . Prenatal Vit-Fe Fumarate-FA (PREPLUS) 27-1 MG TABS Take 1 tablet by mouth daily. 30 tablet 13 12/10/2019 at Unknown time  . scopolamine (TRANSDERM-SCOP) 1 MG/3DAYS Place 1 patch (1.5 mg total) onto the skin every 3 (three) days. 10 patch 12 12/10/2019 at Unknown time  . sodium phosphate (FLEET) 7-19 GM/118ML ENEM Place 133 mLs (1 enema total) rectally daily as needed for severe constipation. 133 mL 5 Past Week at Unknown time  . diphenhydrAMINE (BENADRYL) 25 mg capsule Take 1 capsule (25 mg total) by mouth every 6 (six) hours as needed (nausea). (Patient not taking: Reported on 12/04/2019) 30 capsule 0   . docusate sodium (COLACE) 100 MG capsule Take 1 capsule (100 mg total) by mouth 2 (  two) times daily. 60 capsule 0   . pantoprazole (PROTONIX) 40 MG tablet Take 1 tablet (40 mg total) by mouth 2 (two) times daily. (Patient not taking: Reported on 12/04/2019) 60 tablet 1   . polyethylene glycol (MIRALAX / GLYCOLAX) 17 g packet Take 17 g by mouth daily. 14 each 0     Review of Systems Physical Exam   Blood pressure (!) 106/56, pulse 84, temperature 98.3 F (36.8 C), temperature source Oral, resp. rate 16, weight 75.7 kg, last menstrual period 04/07/2019, SpO2 100 %.  Patient Vitals for the past 24 hrs:  BP Temp Temp src Pulse Resp SpO2 Weight  12/10/19 1712 -- -- -- -- 16 -- --  12/10/19 1708 (!) 106/56 -- -- 84 -- -- --   12/10/19 1459 -- -- -- -- -- -- 75.7 kg  12/10/19 1333 113/69 98.3 F (36.8 C) Oral 76 18 100 % --   Physical Exam  Constitutional: She is oriented to person, place, and time. She appears well-developed and well-nourished. No distress.  HENT:  Head: Normocephalic and atraumatic.  Respiratory: Effort normal.  GI: Soft. She exhibits no distension and no mass. There is no abdominal tenderness. There is no rebound and no guarding.  Genitourinary: There is no rash, tenderness or lesion on the right labia. There is no rash, tenderness or lesion on the left labia. Cervix exhibits no motion tenderness, no discharge and no friability.    No vaginal discharge, tenderness or bleeding.  No tenderness or bleeding in the vagina.    Genitourinary Comments: CE: 1/long/posterior   Neurological: She is alert and oriented to person, place, and time.  Skin: Skin is warm and dry. She is not diaphoretic.  Psychiatric: She has a normal mood and affect. Her behavior is normal. Judgment and thought content normal.   Results for orders placed or performed during the hospital encounter of 12/10/19 (from the past 24 hour(s))  Urinalysis, Routine w reflex microscopic     Status: Abnormal   Collection Time: 12/10/19  2:13 PM  Result Value Ref Range   Color, Urine AMBER (A) YELLOW   APPearance HAZY (A) CLEAR   Specific Gravity, Urine 1.023 1.005 - 1.030   pH 7.0 5.0 - 8.0   Glucose, UA NEGATIVE NEGATIVE mg/dL   Hgb urine dipstick NEGATIVE NEGATIVE   Bilirubin Urine NEGATIVE NEGATIVE   Ketones, ur 20 (A) NEGATIVE mg/dL   Protein, ur 100 (A) NEGATIVE mg/dL   Nitrite NEGATIVE NEGATIVE   Leukocytes,Ua NEGATIVE NEGATIVE   RBC / HPF 0-5 0 - 5 RBC/hpf   WBC, UA 11-20 0 - 5 WBC/hpf   Bacteria, UA MANY (A) NONE SEEN   Squamous Epithelial / LPF 6-10 0 - 5   Mucus PRESENT   Wet prep, genital     Status: Abnormal   Collection Time: 12/10/19  2:21 PM  Result Value Ref Range   Yeast Wet Prep HPF POC NONE SEEN NONE SEEN    Trich, Wet Prep NONE SEEN NONE SEEN   Clue Cells Wet Prep HPF POC NONE SEEN NONE SEEN   WBC, Wet Prep HPF POC MANY (A) NONE SEEN   Sperm NONE SEEN   CBC with Differential/Platelet     Status: Abnormal   Collection Time: 12/10/19  2:25 PM  Result Value Ref Range   WBC 7.6 4.0 - 10.5 K/uL   RBC 3.38 (L) 3.87 - 5.11 MIL/uL   Hemoglobin 9.9 (L) 12.0 - 15.0 g/dL   HCT 29.1 (L) 36.0 -  46.0 %   MCV 86.1 80.0 - 100.0 fL   MCH 29.3 26.0 - 34.0 pg   MCHC 34.0 30.0 - 36.0 g/dL   RDW 16.1 (H) 09.6 - 04.5 %   Platelets 163 150 - 400 K/uL   nRBC 0.0 0.0 - 0.2 %   Neutrophils Relative % 76 %   Neutro Abs 5.9 1.7 - 7.7 K/uL   Lymphocytes Relative 15 %   Lymphs Abs 1.1 0.7 - 4.0 K/uL   Monocytes Relative 7 %   Monocytes Absolute 0.5 0.1 - 1.0 K/uL   Eosinophils Relative 1 %   Eosinophils Absolute 0.0 0.0 - 0.5 K/uL   Basophils Relative 0 %   Basophils Absolute 0.0 0.0 - 0.1 K/uL   Immature Granulocytes 1 %   Abs Immature Granulocytes 0.06 0.00 - 0.07 K/uL  Comprehensive metabolic panel     Status: Abnormal   Collection Time: 12/10/19  2:25 PM  Result Value Ref Range   Sodium 138 135 - 145 mmol/L   Potassium 3.1 (L) 3.5 - 5.1 mmol/L   Chloride 101 98 - 111 mmol/L   CO2 27 22 - 32 mmol/L   Glucose, Bld 77 70 - 99 mg/dL   BUN <5 (L) 6 - 20 mg/dL   Creatinine, Ser 4.09 0.44 - 1.00 mg/dL   Calcium 8.7 (L) 8.9 - 10.3 mg/dL   Total Protein 5.7 (L) 6.5 - 8.1 g/dL   Albumin 2.9 (L) 3.5 - 5.0 g/dL   AST 17 15 - 41 U/L   ALT 26 0 - 44 U/L   Alkaline Phosphatase 75 38 - 126 U/L   Total Bilirubin 0.3 0.3 - 1.2 mg/dL   GFR calc non Af Amer >60 >60 mL/min   GFR calc Af Amer >60 >60 mL/min   Anion gap 10 5 - 15   MAU Course  Procedures  MDM -N/V in pregnancy, on-going issue -dx HEG, weight today 75.5kh, last weight on 12/04/2019 78.9kg -pt reports taking all meds except Robinul sometime yesterday, but not on prescribed schedule -pt not actively vomiting while in MAU, some spitting -UA:  amber/hazy/20ketones/100PRO/many bacteria, urine sent for culture -CBC w/ Diff: WBCs 7.6, hgb 9.9 (will start on iron) -CMP: K 3.1, otherwise WNL -1L LR + Benadryl 12.5mg  + Reglan  + Protonix  given IV -PO robinul ordered -Zofran given by EMS prior to arrival -pt has on new scopolamine patch -PO challenge successful, pt able to keep down Robinul PO and water  -spotting x1wk with pelvic cramping, no bleeding on exam -pt denies ctx -CE: 1/long/posterior -WetPrep: WNL -GC/CT collected  -EFM: reactive       -baseline: 130       -variability: moderate       -accels: present, 15x15       -decels: absent       -TOCO: few, irregular, pt not feeling  -pt discharged to home in stable condition  Orders Placed This Encounter  Procedures  . Culture, OB Urine    Standing Status:   Standing    Number of Occurrences:   1  . Wet prep, genital    Standing Status:   Standing    Number of Occurrences:   1  . Urinalysis, Routine w reflex microscopic    Standing Status:   Standing    Number of Occurrences:   1  . CBC with Differential/Platelet    Standing Status:   Standing    Number of Occurrences:   1  .  Comprehensive metabolic panel    Standing Status:   Standing    Number of Occurrences:   1  . Insert peripheral IV    Standing Status:   Standing    Number of Occurrences:   1  . Discharge patient    Order Specific Question:   Discharge disposition    Answer:   01-Home or Self Care [1]    Order Specific Question:   Discharge patient date    Answer:   12/10/2019   Meds ordered this encounter  Medications  . diphenhydrAMINE (BENADRYL) injection 12.5 mg  . metoCLOPramide (REGLAN) injection 10 mg  . pantoprazole (PROTONIX) injection 40 mg  . glycopyrrolate (ROBINUL) tablet 1 mg  . lactated ringers bolus 1,000 mL  . ferrous sulfate 325 (65 FE) MG tablet    Sig: Take 1 tablet (325 mg total) by mouth daily.    Dispense:  30 tablet    Refill:  3    Order Specific Question:    Supervising Provider    Answer:   ERVIN, MICHAEL L [[id number]]  . potassium chloride (KLOR-CON) 10 MEQ tablet    Sig: Take 1 tablet (10 mEq total) by mouth daily.    Dispense:  5 tablet    Refill:  0    Order Specific Question:   Supervising Provider    Answer:   ERVIN, MICHAEL L [[id number]]   Assessment and Plan   1. Hyperemesis gravidarum   2. Supervision of high risk pregnancy, antepartum   3. Poor fetal growth affecting management of mother in third trimester, single or unspecified fetus   4. Rubella non-immune status, antepartum   5. Trichomonal vaginitis during pregnancy in third trimester   6. [redacted] weeks gestation of pregnancy   7. NST (non-stress test) reactive   8. Anemia during pregnancy in third trimester   9. Hypokalemia   10. Spotting in pregnancy   11. Pelvic cramping in antepartum period    Allergies as of 12/10/2019      Reactions   Phenergan [promethazine Hcl]    Tardive dyskinesia       Medication List    TAKE these medications   diphenhydrAMINE 25 mg capsule Commonly known as: BENADRYL Take 1 capsule (25 mg total) by mouth every 6 (six) hours as needed (nausea).   docusate sodium 100 MG capsule Commonly known as: COLACE Take 1 capsule (100 mg total) by mouth 2 (two) times daily.   ferrous sulfate 325 (65 FE) MG tablet Take 1 tablet (325 mg total) by mouth daily.   glycopyrrolate 1 MG tablet Commonly known as: ROBINUL Take 1 tablet (1 mg total) by mouth 3 (three) times daily.   ondansetron 4 MG disintegrating tablet Commonly known as: Zofran ODT Take 1 tablet (4 mg total) by mouth every 8 (eight) hours as needed for nausea or vomiting.   pantoprazole 40 MG tablet Commonly known as: Protonix Take 1 tablet (40 mg total) by mouth 2 (two) times daily.   polyethylene glycol 17 g packet Commonly known as: MIRALAX / GLYCOLAX Take 17 g by mouth daily.   potassium chloride 10 MEQ tablet Commonly known as: KLOR-CON Take 1 tablet (10 mEq total) by mouth  daily.   PrePLUS 27-1 MG Tabs Take 1 tablet by mouth daily.   scopolamine 1 MG/3DAYS Commonly known as: TRANSDERM-SCOP Place 1 patch (1.5 mg total) onto the skin every 3 (three) days.   sodium phosphate 7-19 GM/118ML Enem Place 133 mLs (1 enema total) rectally daily as  needed for severe constipation.      -will call with culture results, if positive -RX ferrous sulfate -RX KDur -pt advised to take medications around the clock and not to stop taking if feeling better -pt advised that she was given a month's worth of medications from visit on 11/22/2019 with one refill for all medications; pt advised she can pick up refill tonight if desired at the [facility] as this is where her new prescriptions were sent -discussed normal expectations for N/V in pregnancy -return MAU precautions given -pt discharged to home in stable condition  Joni Reining E Majour Frei 12/10/2019, 5:25 PM

## 2019-12-11 ENCOUNTER — Other Ambulatory Visit: Payer: Self-pay

## 2019-12-11 ENCOUNTER — Inpatient Hospital Stay (HOSPITAL_COMMUNITY)
Admission: AD | Admit: 2019-12-11 | Discharge: 2019-12-12 | Disposition: A | Discharge status: Home / Self Care | Payer: Medicaid Other | Attending: Family Medicine | Admitting: Family Medicine

## 2019-12-11 ENCOUNTER — Encounter (HOSPITAL_COMMUNITY): Payer: Self-pay | Admitting: Family Medicine

## 2019-12-11 ENCOUNTER — Inpatient Hospital Stay (HOSPITAL_BASED_OUTPATIENT_CLINIC_OR_DEPARTMENT_OTHER): Payer: Medicaid Other

## 2019-12-11 DIAGNOSIS — Z9049 Acquired absence of other specified parts of digestive tract: Secondary | ICD-10-CM | POA: Insufficient documentation

## 2019-12-11 DIAGNOSIS — Z79899 Other long term (current) drug therapy: Secondary | ICD-10-CM | POA: Diagnosis not present

## 2019-12-11 DIAGNOSIS — O4703 False labor before 37 completed weeks of gestation, third trimester: Secondary | ICD-10-CM | POA: Diagnosis not present

## 2019-12-11 DIAGNOSIS — Z362 Encounter for other antenatal screening follow-up: Secondary | ICD-10-CM | POA: Diagnosis not present

## 2019-12-11 DIAGNOSIS — R110 Nausea: Secondary | ICD-10-CM

## 2019-12-11 DIAGNOSIS — O479 False labor, unspecified: Secondary | ICD-10-CM

## 2019-12-11 DIAGNOSIS — O99513 Diseases of the respiratory system complicating pregnancy, third trimester: Secondary | ICD-10-CM | POA: Insufficient documentation

## 2019-12-11 DIAGNOSIS — O99891 Other specified diseases and conditions complicating pregnancy: Secondary | ICD-10-CM | POA: Insufficient documentation

## 2019-12-11 DIAGNOSIS — K117 Disturbances of salivary secretion: Secondary | ICD-10-CM | POA: Insufficient documentation

## 2019-12-11 DIAGNOSIS — O368130 Decreased fetal movements, third trimester, not applicable or unspecified: Secondary | ICD-10-CM

## 2019-12-11 DIAGNOSIS — Z3A35 35 weeks gestation of pregnancy: Secondary | ICD-10-CM

## 2019-12-11 DIAGNOSIS — O365930 Maternal care for other known or suspected poor fetal growth, third trimester, not applicable or unspecified: Secondary | ICD-10-CM | POA: Diagnosis not present

## 2019-12-11 DIAGNOSIS — O163 Unspecified maternal hypertension, third trimester: Secondary | ICD-10-CM | POA: Diagnosis not present

## 2019-12-11 DIAGNOSIS — Z888 Allergy status to other drugs, medicaments and biological substances status: Secondary | ICD-10-CM | POA: Diagnosis not present

## 2019-12-11 DIAGNOSIS — O34219 Maternal care for unspecified type scar from previous cesarean delivery: Secondary | ICD-10-CM | POA: Insufficient documentation

## 2019-12-11 DIAGNOSIS — O4700 False labor before 37 completed weeks of gestation, unspecified trimester: Secondary | ICD-10-CM

## 2019-12-11 DIAGNOSIS — O210 Mild hyperemesis gravidarum: Secondary | ICD-10-CM | POA: Diagnosis not present

## 2019-12-11 DIAGNOSIS — O368190 Decreased fetal movements, unspecified trimester, not applicable or unspecified: Secondary | ICD-10-CM

## 2019-12-11 DIAGNOSIS — J45909 Unspecified asthma, uncomplicated: Secondary | ICD-10-CM | POA: Diagnosis not present

## 2019-12-11 DIAGNOSIS — Z3A32 32 weeks gestation of pregnancy: Secondary | ICD-10-CM

## 2019-12-11 DIAGNOSIS — Z8632 Personal history of gestational diabetes: Secondary | ICD-10-CM | POA: Insufficient documentation

## 2019-12-11 DIAGNOSIS — O365990 Maternal care for other known or suspected poor fetal growth, unspecified trimester, not applicable or unspecified: Secondary | ICD-10-CM

## 2019-12-11 LAB — URINALYSIS, ROUTINE W REFLEX MICROSCOPIC
Bilirubin Urine: NEGATIVE
Glucose, UA: NEGATIVE mg/dL
Hgb urine dipstick: NEGATIVE
Ketones, ur: NEGATIVE mg/dL
Leukocytes,Ua: NEGATIVE
Nitrite: NEGATIVE
Protein, ur: 30 mg/dL — AB
Specific Gravity, Urine: 1.017 (ref 1.005–1.030)
pH: 7 (ref 5.0–8.0)

## 2019-12-11 LAB — GC/CHLAMYDIA PROBE AMP (~~LOC~~) NOT AT ARMC
Chlamydia: NEGATIVE
Comment: NEGATIVE
Comment: NORMAL
Neisseria Gonorrhea: NEGATIVE

## 2019-12-11 LAB — CULTURE, OB URINE: Culture: <10000 — AB

## 2019-12-11 MED ORDER — ONDANSETRON HCL 4 MG/2ML IJ SOLN
4.0000 mg | Freq: Once | INTRAMUSCULAR | Status: AC
  Administered 2019-12-11: 4 mg via INTRAVENOUS
  Filled 2019-12-11: qty 2

## 2019-12-11 MED ORDER — LACTATED RINGERS IV BOLUS
1000.0000 mL | Freq: Once | INTRAVENOUS | Status: AC
  Administered 2019-12-11: 1000 mL via INTRAVENOUS

## 2019-12-11 MED ORDER — ONDANSETRON 4 MG PO TBDP
4.0000 mg | ORAL_TABLET | Freq: Once | ORAL | Status: AC
  Administered 2019-12-11: 4 mg via ORAL
  Filled 2019-12-11: qty 1

## 2019-12-11 MED ORDER — GLYCOPYRROLATE 0.2 MG/ML IJ SOLN
0.1000 mg | Freq: Once | INTRAMUSCULAR | Status: AC
  Administered 2019-12-11: 0.1 mg via INTRAVENOUS
  Filled 2019-12-11: qty 1

## 2019-12-11 MED ORDER — NIFEDIPINE 10 MG PO CAPS: 10.0000 mg | ORAL_CAPSULE | ORAL | Status: DC | PRN

## 2019-12-11 NOTE — MAU Provider Note (Addendum)
History     CSN: [id number]  Arrival date and time: 12/11/19 [id number]   First Provider Initiated Contact with Patient 12/11/19 1849      Chief Complaint  Patient presents with  . Contractions  . Nausea   HPI  Julie Coleman is a 28 y.o. [phone] at 10581w3d who presents to MAU today with complaint of contractions q 5-10 minutes today. She was here yesterday with the same complaint. She has been seen numerous times in this pregnancy already for the ongoing N/V. She has had 4 admissions for HEG this pregnancy. She states that she has on a scopolamine patch, but otherwise hasn't been able to take any medications today. She also states no FM noted since leaving here yesterday. She denies vaginal bleeding or LOF.   OB History    Gravida  6   Para  3   Term  2   Preterm  1   AB  2   Living  3     SAB  1   TAB  1   Ectopic      Multiple      Live Births  3           Past Medical History:  Diagnosis Date  . Asthma    albuteral inhaler  . Gestational diabetes   . Hypertension     Past Surgical History:  Procedure Laterality Date  . CESAREAN SECTION    . CHOLECYSTECTOMY      History reviewed. No pertinent family history.  Social History   Tobacco Use  . Smoking status: Never Smoker  . Smokeless tobacco: Never Used  Substance Use Topics  . Alcohol use: Not Currently  . Drug use: Yes    Types: Marijuana    Comment: used yesterday, 11/16/19    Allergies:  Allergies  Allergen Reactions  . Phenergan [Promethazine Hcl]     Tardive dyskinesia     Medications Prior to Admission  Medication Sig Dispense Refill Last Dose  . ferrous sulfate 325 (65 FE) MG tablet Take 1 tablet (325 mg total) by mouth daily. 30 tablet 3 Past Week at Unknown time  . glycopyrrolate (ROBINUL) 1 MG tablet Take 1 tablet (1 mg total) by mouth 3 (three) times daily. 60 tablet 1 12/10/2019 at Unknown time  . ondansetron (ZOFRAN ODT) 4 MG disintegrating tablet Take 1 tablet (4 mg  total) by mouth every 8 (eight) hours as needed for nausea or vomiting. 60 tablet 1 12/10/2019 at Unknown time  . Prenatal Vit-Fe Fumarate-FA (PREPLUS) 27-1 MG TABS Take 1 tablet by mouth daily. 30 tablet 13 12/10/2019 at Unknown time  . scopolamine (TRANSDERM-SCOP) 1 MG/3DAYS Place 1 patch (1.5 mg total) onto the skin every 3 (three) days. 10 patch 12 12/11/2019 at Unknown time  . diphenhydrAMINE (BENADRYL) 25 mg capsule Take 1 capsule (25 mg total) by mouth every 6 (six) hours as needed (nausea). (Patient not taking: Reported on 12/04/2019) 30 capsule 0   . docusate sodium (COLACE) 100 MG capsule Take 1 capsule (100 mg total) by mouth 2 (two) times daily. 60 capsule 0   . pantoprazole (PROTONIX) 40 MG tablet Take 1 tablet (40 mg total) by mouth 2 (two) times daily. (Patient not taking: Reported on 12/04/2019) 60 tablet 1   . polyethylene glycol (MIRALAX / GLYCOLAX) 17 g packet Take 17 g by mouth daily. 14 each 0   . potassium chloride (KLOR-CON) 10 MEQ tablet Take 1 tablet (10 mEq total) by mouth  daily. 5 tablet 0   . sodium phosphate (FLEET) 7-19 GM/118ML ENEM Place 133 mLs (1 enema total) rectally daily as needed for severe constipation. 133 mL 5     Review of Systems  Constitutional: Negative for fever.  Gastrointestinal: Positive for abdominal pain, nausea and vomiting. Negative for constipation and diarrhea.  Genitourinary: Negative for dysuria, frequency, urgency, vaginal bleeding and vaginal discharge.   Physical Exam   Blood pressure 130/77, pulse 76, temperature 98.5 F (36.9 C), temperature source Oral, resp. rate 18, height 5\' 7"  (1.702 m), weight 75.3 kg, last menstrual period 04/07/2019, SpO2 99 %.  Physical Exam  Nursing note and vitals reviewed. Constitutional: She is oriented to person, place, and time. She appears well-developed and well-nourished. No distress.  HENT:  Head: Normocephalic and atraumatic.  Cardiovascular: Normal rate.  Respiratory: Effort normal.  GI:  Soft. She exhibits no distension and no mass. There is abdominal tenderness (mild diffuse). There is no rebound and no guarding.  Neurological: She is alert and oriented to person, place, and time.  Skin: Skin is warm and dry. No erythema.  Psychiatric: She has a normal mood and affect.  Dilation: 1 Effacement (%): Thick Cervical Position: Posterior Station: Ballotable Exam by:: [email or web address], PA-C   Fetal Monitoring: Baseline: 120 bpm Variability: moderate Accelerations: 15 x 15 Decelerations: none Contractions: few, irregular   MAU Course  Procedures None  MDM UA today  IV LR bolus with 4 mg IM Zofran and Robinul ordered  NST reactive, but patient does not palpate any movement, BPP ordered.  Patient also due for growth Vonzella Nipple and has missed appointments, so that will be done today Per sonographer, patient doesn't meet criteria for UA doppler today, EFW 30%, BPP 8/8 on preliminary  RN unable to get IV access prior to [place]. IV team called. Patient waiting for IV fluids and medications.  2100 - Care turned over to 2101, CNM   Wynelle Bourgeois, PA-C 12/11/2019, 8:59 PM  Assessment and Plan   Started having contractions q 4 min Procardia series ordered Patient refuses medication States she wants to deliver.  States all her babies came "right about now". Records indicate 40 week deliveries with one at 30 weeks due to fetal distress (not PTL) and one stillbirth at 22 weeks due to abruption.   Discussed we will continue hydration then recheck cervix  Has not vomited since here She ate part of a sandwith with no vomiting One additional dose of Zofran 4mg  ODT given  UCs spaced out but still present Cervix unchanged  Explained we don't see change in Cervix  She is upset Cx exam values are different and that she is sure she is in labor.  The external os admits a fingertip but cannot get finger past external os.  Middle and internal os are closed.  Cervix is long.  I told her  regardless of what value I call it, if she were in labor, her cervix would be opening MORE.  It is not, so the contractions do no represent labor  States "If I go home I am just going to have to come back again"  Demanded to speak to Dr 12/13/2019.  Case reviewed with Dr who recommended discharge Demanded to speak to nursing supervisor After discussion with him, she was discharged home in stable condition Wanted a new prescription for Zofran. We explained she has a current Rx at pharmacy and can either have it transferred or fill it there.  She states she  does not have the $3 copay.  I suggested she speak with clinic about finding assistance.   Has already received a supply of meds via "[facility]"  Encouraged to return here or to other Urgent Care/ED if she develops worsening of symptoms, increase in pain, fever, or other concerning symptoms.    Seabron Spates, CNM

## 2019-12-11 NOTE — MAU Note (Signed)
.   Julie Coleman is a 28 y.o. at [redacted]w[redacted]d here in MAU reporting: ctx that are every 5 minutes apart. Patient has not felt baby move at all today. Patient continues to have n/v but did not pick up her medications sent to her pharmacy yesterday. No LOF. No VB.  Pain score: 9 Vitals:   12/11/19 1840  BP: 130/77  Pulse: 76  Resp: 18  Temp: 98.5 F (36.9 C)  SpO2: 99%     FHT:136 Lab orders placed from triage:

## 2019-12-11 NOTE — MAU Note (Signed)
Four unsuccessful attempts to start iv. IV consult order put in. Pt aware and agrees with plan

## 2019-12-12 NOTE — MAU Note (Signed)
Upon discharge, patient states she does not have a way to get home.  Patient offered a bus pass and told she could wait in the lobby until the buses start running.  Patient desires to speak with the Administrative Coordinator, who informed patient the same thing and offered her a bus pass.  Patient stated she refused to leave until given a ride home.  [place] PD notified and escorted patient off the unit without difficulty.

## 2019-12-12 NOTE — Progress Notes (Signed)
Called by MAU staff to speak with patient regarding transportation.  I advised patient that we would not be providing an more taxi vouchers due to the abuse of that service.  Pt then stated "I'm not leaving this hospital until I have a ride home."  I advised patient of the trespass situation just as I described during a previous encounter with her.  I ultimately had to call [employer] to have her removed from the room.  Security just reported to me that the patient has a ride on the way.

## 2019-12-12 NOTE — Discharge Instructions (Signed)
Preterm Labor and Birth Information Pregnancy normally lasts 39-41 weeks. Preterm labor is when labor starts early. It starts before you have been pregnant for 37 whole weeks. What are the risk factors for preterm labor? Preterm labor is more likely to occur in women who:  Have an infection while pregnant.  Have a cervix that is short.  Have gone into preterm labor before.  Have had surgery on their cervix.  Are younger than age 28.  Are older than age 36.  Are African American.  Are pregnant with two or more babies.  Take street drugs while pregnant.  Smoke while pregnant.  Do not gain enough weight while pregnant.  Got pregnant right after another pregnancy. What are the symptoms of preterm labor? Symptoms of preterm labor include:  Cramps. The cramps may feel like the cramps some women get during their period. The cramps may happen with watery poop (diarrhea).  Pain in the belly (abdomen).  Pain in the lower back.  Regular contractions or tightening. It may feel like your belly is getting tighter.  Pressure in the lower belly that seems to get stronger.  More fluid (discharge) leaking from the vagina. The fluid may be watery or bloody.  Water breaking. Why is it important to notice signs of preterm labor? Babies who are born early may not be fully developed. They have a higher chance for:  Long-term heart problems.  Long-term lung problems.  Trouble controlling body systems, like breathing.  Bleeding in the brain.  A condition called cerebral palsy.  Learning difficulties.  Death. These risks are highest for babies who are born before 8 weeks of pregnancy. How is preterm labor treated? Treatment depends on:  How long you were pregnant.  Your condition.  The health of your baby. Treatment may involve:  Having a stitch (suture) placed in your cervix. When you give birth, your cervix opens so the baby can come out. The stitch keeps the cervix  from opening too soon.  Staying at the hospital.  Taking or getting medicines, such as: ? Hormone medicines. ? Medicines to stop contractions. ? Medicines to help the baby's lungs develop. ? Medicines to prevent your baby from having cerebral palsy. What should I do if I am in preterm labor? If you think you are going into labor too soon, call your doctor right away. How can I prevent preterm labor?  Do not use any tobacco products. ? Examples of these are cigarettes, chewing tobacco, and e-cigarettes. ? If you need help quitting, ask your doctor.  Do not use street drugs.  Do not use any medicines unless you ask your doctor if they are safe for you.  Talk with your doctor before taking any herbal supplements.  Make sure you gain enough weight.  Watch for infection. If you think you might have an infection, get it checked right away.  If you have gone into preterm labor before, tell your doctor. This information is not intended to replace advice given to you by your health care provider. Make sure you discuss any questions you have with your health care provider. Document Released: 03/04/2009 Document Revised: 03/30/2019 Document Reviewed: 04/28/2016 Elsevier Patient Education  [place]. Morning Sickness  Morning sickness is when a woman feels nauseous during pregnancy. This nauseous feeling may or may not come with vomiting. It often occurs in the morning, but it can be a problem at any time of day. Morning sickness is most common during the first trimester. In  some cases, it may continue throughout pregnancy. Although morning sickness is unpleasant, it is usually harmless unless the woman develops severe and continual vomiting (hyperemesis gravidarum), a condition that requires more intense treatment. What are the causes? The exact cause of this condition is not known, but it seems to be related to normal hormonal changes that occur in pregnancy. What increases the  risk? You are more likely to develop this condition if:  You experienced nausea or vomiting before your pregnancy.  You had morning sickness during a previous pregnancy.  You are pregnant with more than one baby, such as twins. What are the signs or symptoms? Symptoms of this condition include:  Nausea.  Vomiting. How is this diagnosed? This condition is usually diagnosed based on your signs and symptoms. How is this treated? In many cases, treatment is not needed for this condition. Making some changes to what you eat may help to control symptoms. Your health care provider may also prescribe or recommend:  Vitamin B6 supplements.  Anti-nausea medicines.  Ginger. Follow these instructions at home: Medicines  Take over-the-counter and prescription medicines only as told by your health care provider. Do not use any prescription, over-the-counter, or herbal medicines for morning sickness without first talking with your health care provider.  Taking multivitamins before getting pregnant can prevent or decrease the severity of morning sickness in most women. Eating and drinking  Eat a piece of dry toast or crackers before getting out of bed in the morning.  Eat 5 or 6 small meals a day.  Eat dry and bland foods, such as rice or a baked potato. Foods that are high in carbohydrates are often helpful.  Avoid greasy, fatty, and spicy foods.  Have someone cook for you if the smell of any food causes nausea and vomiting.  If you feel nauseous after taking prenatal vitamins, take the vitamins at night or with a snack.  Snack on protein foods between meals if you are hungry. Nuts, yogurt, and cheese are good options.  Drink fluids throughout the day.  Try ginger ale made with real ginger, ginger tea made from fresh grated ginger, or ginger candies. General instructions  Do not use any products that contain nicotine or tobacco, such as cigarettes and e-cigarettes. If you need  help quitting, ask your health care provider.  Get an air purifier to keep the air in your house free of odors.  Get plenty of fresh air.  Try to avoid odors that trigger your nausea.  Consider trying these methods to help relieve symptoms: ? Wearing an acupressure wristband. These wristbands are often worn for seasickness. ? Acupuncture. Contact a health care provider if:  Your home remedies are not working and you need medicine.  You feel dizzy or light-headed.  You are losing weight. Get help right away if:  You have persistent and uncontrolled nausea and vomiting.  You faint.  You have severe pain in your abdomen. Summary  Morning sickness is when a woman feels nauseous during pregnancy. This nauseous feeling may or may not come with vomiting.  Morning sickness is most common during the first trimester.  It often occurs in the morning, but it can be a problem at any time of day.  In many cases, treatment is not needed for this condition. Making some changes to what you eat may help to control symptoms. This information is not intended to replace advice given to you by your health care provider. Make sure you discuss any questions  you have with your health care provider. Document Released: 01/27/2007 Document Revised: 11/18/2017 Document Reviewed: 01/08/2017 Elsevier Patient Education  2020 [employer]

## 2019-12-13 ENCOUNTER — Inpatient Hospital Stay (HOSPITAL_COMMUNITY)
Admission: AD | Admit: 2019-12-13 | Discharge: 2019-12-14 | Disposition: A | Discharge status: Home / Self Care | Payer: Medicaid Other | Attending: Obstetrics and Gynecology | Admitting: Obstetrics and Gynecology

## 2019-12-13 ENCOUNTER — Encounter (HOSPITAL_COMMUNITY): Payer: Self-pay | Admitting: Obstetrics and Gynecology

## 2019-12-13 ENCOUNTER — Other Ambulatory Visit: Payer: Self-pay

## 2019-12-13 DIAGNOSIS — O99513 Diseases of the respiratory system complicating pregnancy, third trimester: Secondary | ICD-10-CM | POA: Diagnosis not present

## 2019-12-13 DIAGNOSIS — O4703 False labor before 37 completed weeks of gestation, third trimester: Secondary | ICD-10-CM | POA: Insufficient documentation

## 2019-12-13 DIAGNOSIS — J45909 Unspecified asthma, uncomplicated: Secondary | ICD-10-CM | POA: Diagnosis not present

## 2019-12-13 DIAGNOSIS — O479 False labor, unspecified: Secondary | ICD-10-CM

## 2019-12-13 DIAGNOSIS — O26893 Other specified pregnancy related conditions, third trimester: Secondary | ICD-10-CM | POA: Diagnosis not present

## 2019-12-13 DIAGNOSIS — R519 Headache, unspecified: Secondary | ICD-10-CM | POA: Diagnosis not present

## 2019-12-13 DIAGNOSIS — O210 Mild hyperemesis gravidarum: Secondary | ICD-10-CM | POA: Insufficient documentation

## 2019-12-13 DIAGNOSIS — Z79899 Other long term (current) drug therapy: Secondary | ICD-10-CM | POA: Insufficient documentation

## 2019-12-13 DIAGNOSIS — O368130 Decreased fetal movements, third trimester, not applicable or unspecified: Secondary | ICD-10-CM | POA: Insufficient documentation

## 2019-12-13 DIAGNOSIS — Z3A35 35 weeks gestation of pregnancy: Secondary | ICD-10-CM | POA: Diagnosis not present

## 2019-12-13 MED ORDER — M.V.I. ADULT IV INJ
Freq: Once | INTRAVENOUS | Status: AC
  Filled 2019-12-13: qty 10

## 2019-12-13 MED ORDER — ONDANSETRON HCL 4 MG/2ML IJ SOLN
4.0000 mg | Freq: Once | INTRAMUSCULAR | Status: AC
  Administered 2019-12-14: 4 mg via INTRAVENOUS
  Filled 2019-12-13: qty 2

## 2019-12-13 MED ORDER — GLYCOPYRROLATE 0.2 MG/ML IJ SOLN
0.2000 mg | Freq: Once | INTRAMUSCULAR | Status: AC
  Administered 2019-12-14: 0.2 mg via INTRAVENOUS
  Filled 2019-12-13: qty 1

## 2019-12-13 MED ORDER — LACTATED RINGERS IV BOLUS
1000.0000 mL | Freq: Once | INTRAVENOUS | Status: AC
  Administered 2019-12-14: 1000 mL via INTRAVENOUS

## 2019-12-13 NOTE — MAU Note (Signed)
Pt states that she has been throwing up black stuff all day.   Pt states that she has been having intermittent ctx's today.   Denies vaginal bleeding or LOF.   Reports decreased fetal movement today. Pt states she last felt the baby move a couple of hours ago.

## 2019-12-14 LAB — URINALYSIS, ROUTINE W REFLEX MICROSCOPIC
Bilirubin Urine: NEGATIVE
Glucose, UA: NEGATIVE mg/dL
Hgb urine dipstick: NEGATIVE
Ketones, ur: NEGATIVE mg/dL
Leukocytes,Ua: NEGATIVE
Nitrite: NEGATIVE
Protein, ur: 100 mg/dL — AB
Specific Gravity, Urine: 1.016 (ref 1.005–1.030)
pH: 7 (ref 5.0–8.0)

## 2019-12-14 MED ORDER — METOCLOPRAMIDE HCL 5 MG/ML IJ SOLN
10.0000 mg | Freq: Once | INTRAMUSCULAR | Status: AC
  Administered 2019-12-14: 10 mg via INTRAVENOUS
  Filled 2019-12-14: qty 2

## 2019-12-14 MED ORDER — DIPHENHYDRAMINE HCL 50 MG/ML IJ SOLN
25.0000 mg | Freq: Once | INTRAMUSCULAR | Status: AC
  Administered 2019-12-14: 25 mg via INTRAVENOUS
  Filled 2019-12-14: qty 1

## 2019-12-14 MED ORDER — DEXAMETHASONE SODIUM PHOSPHATE 10 MG/ML IJ SOLN
10.0000 mg | Freq: Once | INTRAMUSCULAR | Status: AC
  Administered 2019-12-14: 10 mg via INTRAVENOUS
  Filled 2019-12-14: qty 1

## 2019-12-14 MED ORDER — DEXTROSE 5 % IN LACTATED RINGERS IV BOLUS
1000.0000 mL | Freq: Once | INTRAVENOUS | Status: AC
  Administered 2019-12-14: 1000 mL via INTRAVENOUS

## 2019-12-14 MED ORDER — BUTORPHANOL TARTRATE 1 MG/ML IJ SOLN
1.0000 mg | Freq: Once | INTRAMUSCULAR | Status: AC
  Administered 2019-12-14: 1 mg via INTRAVENOUS
  Filled 2019-12-14: qty 1

## 2019-12-14 NOTE — Discharge Instructions (Signed)
Hyperemesis Gravidarum °Hyperemesis gravidarum is a severe form of nausea and vomiting that happens during pregnancy. Hyperemesis is worse than morning sickness. It may cause you to have nausea or vomiting all day for many days. It may keep you from eating and drinking enough food and liquids, which can lead to dehydration, malnutrition, and weight loss. Hyperemesis usually occurs during the first half (the first 20 weeks) of pregnancy. It often goes away once a woman is in her second half of pregnancy. However, sometimes hyperemesis continues through an entire pregnancy. °What are the causes? °The cause of this condition is not known. It may be related to changes in chemicals (hormones) in the body during pregnancy, such as the high level of pregnancy hormone (human chorionic gonadotropin) or the increase in the female sex hormone (estrogen). °What are the signs or symptoms? °Symptoms of this condition include: °· Nausea that does not go away. °· Vomiting that does not allow you to keep any food down. °· Weight loss. °· Body fluid loss (dehydration). °· Having no desire to eat, or not liking food that you have previously enjoyed. °How is this diagnosed? °This condition may be diagnosed based on: °· A physical exam. °· Your medical history. °· Your symptoms. °· Blood tests. °· Urine tests. °How is this treated? °This condition is managed by controlling symptoms. This may include: °· Following an eating plan. This can help lessen nausea and vomiting. °· Taking prescription medicines. °An eating plan and medicines are often used together to help control symptoms. If medicines do not help relieve nausea and vomiting, you may need to receive fluids through an IV at the hospital. °Follow these instructions at home: °Eating and drinking ° °· Avoid the following: °? Drinking fluids with meals. Try not to drink anything during the 30 minutes before and after your meals. °? Drinking more than 1 cup of fluid at a  time. °? Eating foods that trigger your symptoms. These may include spicy foods, coffee, high-fat foods, very sweet foods, and acidic foods. °? Skipping meals. Nausea can be more intense on an empty stomach. If you cannot tolerate food, do not force it. Try sucking on ice chips or other frozen items and make up for missed calories later. °? Lying down within 2 hours after eating. °? Being exposed to environmental triggers. These may include food smells, smoky rooms, closed spaces, rooms with strong smells, warm or humid places, overly loud and noisy rooms, and rooms with motion or flickering lights. Try eating meals in a well-ventilated area that is free of strong smells. °? Quick and sudden changes in your movement. °? Taking iron pills and multivitamins that contain iron. If you take prescription iron pills, do not stop taking them unless your health care provider approves. °? Preparing food. The smell of food can spoil your appetite or trigger nausea. °· To help relieve your symptoms: °? Listen to your body. Everyone is different and has different preferences. Find what works best for you. °? Eat and drink slowly. °? Eat 5-6 small meals daily instead of 3 large meals. Eating small meals and snacks can help you avoid an empty stomach. °? In the morning, before getting out of bed, eat a couple of crackers to avoid moving around on an empty stomach. °? Try eating starchy foods as these are usually tolerated well. Examples include cereal, toast, bread, potatoes, pasta, rice, and pretzels. °? Include at least 1 serving of protein with your meals and snacks. Protein options include   lean meats, poultry, seafood, beans, nuts, nut butters, eggs, cheese, and yogurt. °? Try eating a protein-rich snack before bed. Examples of a protein-rick snack include cheese and crackers or a peanut butter sandwich made with 1 slice of whole-wheat bread and 1 tsp (5 g) of peanut butter. °? Eat or suck on things that have ginger in them.  It may help relieve nausea. Add ¼ tsp ground ginger to hot tea or choose ginger tea. °? Try drinking 100% fruit juice or an electrolyte drink. An electrolyte drink contains sodium, potassium, and chloride. °? Drink fluids that are cold, clear, and carbonated or sour. Examples include lemonade, ginger ale, lemon-lime soda, ice water, and sparkling water. °? Brush your teeth or use a mouth rinse after meals. °? Talk with your health care provider about starting a supplement of vitamin B6. °General instructions °· Take over-the-counter and prescription medicines only as told by your health care provider. °· Follow instructions from your health care provider about eating or drinking restrictions. °· Continue to take your prenatal vitamins as told by your health care provider. If you are having trouble taking your prenatal vitamins, talk with your health care provider about different options. °· Keep all follow-up and pre-birth (prenatal) visits as told by your health care provider. This is important. °Contact a health care provider if: °· You have pain in your abdomen. °· You have a severe headache. °· You have vision problems. °· You are losing weight. °· You feel weak or dizzy. °Get help right away if: °· You cannot drink fluids without vomiting. °· You vomit blood. °· You have constant nausea and vomiting. °· You are very weak. °· You faint. °· You have a fever and your symptoms suddenly get worse. °Summary °· Hyperemesis gravidarum is a severe form of nausea and vomiting that happens during pregnancy. °· Making some changes to your eating habits may help relieve nausea and vomiting. °· This condition may be managed with medicine. °· If medicines do not help relieve nausea and vomiting, you may need to receive fluids through an IV at the hospital. °This information is not intended to replace advice given to you by your health care provider. Make sure you discuss any questions you have with your health care  provider. °Document Released: 12/06/2005 Document Revised: 12/26/2017 Document Reviewed: 08/04/2016 °Elsevier Patient Education © 2020 [employer] ° ° °

## 2019-12-14 NOTE — MAU Provider Note (Signed)
History     CSN: [id number]  Arrival date and time: 12/13/19 2233   First Provider Initiated Contact with Patient 12/13/19 2345      Chief Complaint  Patient presents with  . Emesis  . Contractions   Julie Coleman is a 28 y.o. G6P3 at [redacted]w[redacted]d who presents to MAU via EMS for contractions and emesis. She has been seen multiple times over the course of pregnancy for hyperemesis. Most recently on 12/22. She has had 4 admissions for HEG this pregnancy. She reports she has not taken her medication since visit on 12/22 d/t "not keeping any medication down". She reports intermittent contractions that started occurring this morning, reports contractions occur every 8-10 minutes- denies vaginal bleeding, discharge or LOF. She reports decreased fetal movement - reports she last felt movement 2 hours ago.    OB History    Gravida  6   Para  3   Term  2   Preterm  1   AB  2   Living  3     SAB  1   TAB  1   Ectopic      Multiple      Live Births  3           Past Medical History:  Diagnosis Date  . Asthma    albuteral inhaler  . Gestational diabetes   . Hypertension     Past Surgical History:  Procedure Laterality Date  . CESAREAN SECTION    . CHOLECYSTECTOMY      History reviewed. No pertinent family history.  Social History   Tobacco Use  . Smoking status: Never Smoker  . Smokeless tobacco: Never Used  Substance Use Topics  . Alcohol use: Not Currently  . Drug use: Not Currently    Types: Marijuana    Comment: used yesterday, 11/16/19    Allergies:  Allergies  Allergen Reactions  . Phenergan [Promethazine Hcl]     Tardive dyskinesia     Medications Prior to Admission  Medication Sig Dispense Refill Last Dose  . ferrous sulfate 325 (65 FE) MG tablet Take 1 tablet (325 mg total) by mouth daily. 30 tablet 3 12/12/2019 at Unknown time  . glycopyrrolate (ROBINUL) 1 MG tablet Take 1 tablet (1 mg total) by mouth 3 (three) times daily. 60 tablet 1  12/12/2019 at Unknown time  . ondansetron (ZOFRAN ODT) 4 MG disintegrating tablet Take 1 tablet (4 mg total) by mouth every 8 (eight) hours as needed for nausea or vomiting. 60 tablet 1 12/12/2019 at Unknown time  . pantoprazole (PROTONIX) 40 MG tablet Take 1 tablet (40 mg total) by mouth 2 (two) times daily. 60 tablet 1 12/12/2019 at Unknown time  . scopolamine (TRANSDERM-SCOP) 1 MG/3DAYS Place 1 patch (1.5 mg total) onto the skin every 3 (three) days. 10 patch 12 12/13/2019 at Unknown time  . diphenhydrAMINE (BENADRYL) 25 mg capsule Take 1 capsule (25 mg total) by mouth every 6 (six) hours as needed (nausea). (Patient not taking: Reported on 12/04/2019) 30 capsule 0   . docusate sodium (COLACE) 100 MG capsule Take 1 capsule (100 mg total) by mouth 2 (two) times daily. 60 capsule 0   . polyethylene glycol (MIRALAX / GLYCOLAX) 17 g packet Take 17 g by mouth daily. 14 each 0   . potassium chloride (KLOR-CON) 10 MEQ tablet Take 1 tablet (10 mEq total) by mouth daily. 5 tablet 0   . Prenatal Vit-Fe Fumarate-FA (PREPLUS) 27-1 MG TABS Take 1 tablet  by mouth daily. 30 tablet 13   . sodium phosphate (FLEET) 7-19 GM/118ML ENEM Place 133 mLs (1 enema total) rectally daily as needed for severe constipation. 133 mL 5     Review of Systems  Constitutional: Negative.   Respiratory: Negative.   Cardiovascular: Negative.   Gastrointestinal: Positive for abdominal pain, nausea and vomiting. Negative for constipation and diarrhea.       Contractions   Genitourinary: Negative.   Musculoskeletal: Negative.   Neurological: Negative.   Psychiatric/Behavioral: Negative.    Physical Exam   Blood pressure 111/61, pulse 67, temperature 98.9 F (37.2 C), resp. rate 12, last menstrual period 04/07/2019, SpO2 97 %.  Physical Exam  Constitutional: She is oriented to person, place, and time. She appears well-developed and well-nourished.  Cardiovascular: Normal rate and regular rhythm.  Respiratory: Effort normal  and breath sounds normal. No respiratory distress. She has no wheezes.  GI: Soft. There is no abdominal tenderness. There is no rebound and no guarding.  Gravid appropriate  Musculoskeletal:        General: No edema. Normal range of motion.  Neurological: She is alert and oriented to person, place, and time.  Psychiatric: She has a normal mood and affect. Her behavior is normal. Thought content normal.    Dilation: Closed(external os 0.5cm- closed internally) Effacement (%): Thick Exam by:: Steward DroneVeronica Sherod Cisse, CNM  FHR: 150/moderate/+accels/ no decel  Toco: occasional UC with UI   MAU Course  Procedures  MDM UA negative - no ketones  Treatments in MAU included LR bolus, robinul, zofran IV and banana bag  NST reactive and reassuring - patient reports palpation of fetal movement in MAU   0130: After treatment with initial medication - patient reports HA - rates pain 10/10 and is requesting medication  - Stadol 1mg  ordered for HA   Upon reassessment of HA and n/v - patient asleep in room, emesis bag empty - allowed patient to rest   0315: patient called RN to room to notify of worsening HA, provider to room to assess  - Patient reports HA is present and "even worsening and that nausea is returning" - Discussed option of HA cocktail that will also treat nausea d/t reglan in cocktail, patient agreeable to plan.  - 0350 HA cocktail given with D5LR bolus   Reassessment at 0430- patient asleep on right side with empty emesis bag next to patient's feet  - Patient woken up and pain of HA assessed- patient reports HA is "better" - PO challenge initiated  - Patient able to keep down sprite and crackers  Discussed with patient no need for admission d/t negative ketones in urine, no UC, and successful PO challenge. Patient okay with discharge home, encouraged to take medication that she is prescribed at home.   Discussed reasons to return to MAU. Follow up as scheduled in the office. Return to  MAU as needed. Pt stable at time of discharge.   Orders Placed This Encounter  Procedures  . Urinalysis, Routine w reflex microscopic  . Insert peripheral IV   Meds ordered this encounter  Medications  . lactated ringers bolus 1,000 mL  . glycopyrrolate (ROBINUL) injection 0.2 mg  . ondansetron (ZOFRAN) injection 4 mg  . multivitamins adult (INFUVITE ADULT) 10 mL in lactated ringers 1,000 mL infusion  . butorphanol (STADOL) injection 1 mg  . AND Linked Order Group   . diphenhydrAMINE (BENADRYL) injection 25 mg   . metoCLOPramide (REGLAN) injection 10 mg   . dexamethasone (DECADRON) injection  10 mg  . dextrose 5% lactated ringers bolus 1,000 mL   Assessment and Plan   1. Hyperemesis affecting pregnancy, antepartum   2. Deberah Pelton' contraction   3. Headache in pregnancy, antepartum, third trimester    Discharge home Follow up as scheduled in the office for prenatal care Return to MAU as needed for reasons discussed and/or emergencies  Continue medication prescribed for home use  Preterm labor precautions   Follow-up Information    [facility]. Go on 12/19/2019.   Specialty: Obstetrics and Gynecology Why: Follow up as scheduled for prenatal appointments  Contact information: [address], [address] [id number] mc [place] [address] [phone]         Allergies as of 12/14/2019      Reactions   Phenergan [promethazine Hcl]    Tardive dyskinesia       Medication List    STOP taking these medications   potassium chloride 10 MEQ tablet Commonly known as: KLOR-CON     TAKE these medications   diphenhydrAMINE 25 mg capsule Commonly known as: BENADRYL Take 1 capsule (25 mg total) by mouth every 6 (six) hours as needed (nausea).   docusate sodium 100 MG capsule Commonly known as: COLACE Take 1 capsule (100 mg total) by mouth 2 (two) times daily.   ferrous sulfate 325 (65 FE) MG tablet Take  1 tablet (325 mg total) by mouth daily.   glycopyrrolate 1 MG tablet Commonly known as: ROBINUL Take 1 tablet (1 mg total) by mouth 3 (three) times daily.   ondansetron 4 MG disintegrating tablet Commonly known as: Zofran ODT Take 1 tablet (4 mg total) by mouth every 8 (eight) hours as needed for nausea or vomiting.   pantoprazole 40 MG tablet Commonly known as: Protonix Take 1 tablet (40 mg total) by mouth 2 (two) times daily.   polyethylene glycol 17 g packet Commonly known as: MIRALAX / GLYCOLAX Take 17 g by mouth daily.   PrePLUS 27-1 MG Tabs Take 1 tablet by mouth daily.   scopolamine 1 MG/3DAYS Commonly known as: TRANSDERM-SCOP Place 1 patch (1.5 mg total) onto the skin every 3 (three) days.   sodium phosphate 7-19 GM/118ML Enem Place 133 mLs (1 enema total) rectally daily as needed for severe constipation.       Sharyon Cable CNM 12/14/2019, 4:44 AM   - After discharge of patient - patient reported to RN that she has difficulty getting home and needs assistance. AC to bedside to help with discharge.   Sharyon Cable, CNM 12/14/19, 7:32 AM

## 2019-12-14 NOTE — Progress Notes (Signed)
Called by MAU staff to speak with patient regarding transportation.  I advised patient that we would not be providing any more taxi vouchers due to the abuse of that service, just as I explained to her the previous 2 encounters that I have had with her.  Again, [employer] was called to remove the patient from the room and the building.  With this encounter, great detail was given to the patient regarding her being responsible for her transportation. Trespassing was also discussed in great detail as well.  Conversation witnessed by staff RN caring for the pt.

## 2019-12-19 ENCOUNTER — Ambulatory Visit (INDEPENDENT_AMBULATORY_CARE_PROVIDER_SITE_OTHER): Payer: Medicaid Other | Admitting: Obstetrics and Gynecology

## 2019-12-19 ENCOUNTER — Other Ambulatory Visit: Payer: Self-pay

## 2019-12-19 VITALS — BP 103/73 | HR 78 | Wt 175.8 lb

## 2019-12-19 DIAGNOSIS — O0990 Supervision of high risk pregnancy, unspecified, unspecified trimester: Secondary | ICD-10-CM

## 2019-12-19 DIAGNOSIS — O23593 Infection of other part of genital tract in pregnancy, third trimester: Secondary | ICD-10-CM

## 2019-12-19 DIAGNOSIS — Z87898 Personal history of other specified conditions: Secondary | ICD-10-CM

## 2019-12-19 DIAGNOSIS — O99013 Anemia complicating pregnancy, third trimester: Secondary | ICD-10-CM | POA: Diagnosis not present

## 2019-12-19 DIAGNOSIS — A5901 Trichomonal vulvovaginitis: Secondary | ICD-10-CM | POA: Diagnosis not present

## 2019-12-19 DIAGNOSIS — F12188 Cannabis abuse with other cannabis-induced disorder: Secondary | ICD-10-CM

## 2019-12-19 DIAGNOSIS — O99323 Drug use complicating pregnancy, third trimester: Secondary | ICD-10-CM

## 2019-12-19 DIAGNOSIS — Z3A36 36 weeks gestation of pregnancy: Secondary | ICD-10-CM

## 2019-12-19 DIAGNOSIS — O0993 Supervision of high risk pregnancy, unspecified, third trimester: Secondary | ICD-10-CM | POA: Diagnosis not present

## 2019-12-19 DIAGNOSIS — Z98891 History of uterine scar from previous surgery: Secondary | ICD-10-CM

## 2019-12-19 NOTE — Progress Notes (Signed)
Prenatal Visit Note Date: 12/19/2019 Clinic: [facility]  Subjective:  Julie Coleman is a 28 y.o. [id number] at [redacted]w[redacted]d being seen today for ongoing prenatal care.  She is currently monitored for the following issues for this Coleman-risk pregnancy and has Trichomonal vaginitis during pregnancy; Anemia affecting pregnancy; Heartburn during pregnancy, antepartum; Nausea and vomiting in pregnancy; Cannabis hyperemesis syndrome concurrent with and due to cannabis abuse ([name]); Rubella non-immune status, antepartum; Hyperemesis affecting pregnancy, antepartum; Hyperemesis; IUGR (intrauterine growth restriction) affecting care of mother; Supervision of Coleman risk pregnancy, antepartum; History of cesarean delivery; and History of decreased fetal movements on their problem list.  Patient reports patient states she's at her baseline for fetal movement. She states the fetal movement felt less than before starting at around 35wks. See below  Contractions: Regular. Vag. Bleeding: None.  Movement: Present. Denies leaking of fluid.   The following portions of the patient's history were reviewed and updated as appropriate: allergies, current medications, past family history, past medical history, past social history, past surgical history and problem list. Problem list updated.  Objective:   Vitals:   12/19/19 1336  BP: 103/73  Pulse: 78  Weight: 175 lb 12.8 oz (79.7 kg)    Fetal Status: Fetal Heart Rate (bpm): 140s Fundal Height: 36 cm Movement: Present  Presentation: Vertex  General:  Alert, oriented and cooperative. Patient is in no acute distress.  Skin: Skin is warm and dry. No rash noted.   Cardiovascular: Normal heart rate noted  Respiratory: Normal respiratory effort, no problems with respiration noted  Abdomen: Soft, gravid, appropriate for gestational age. Pain/Pressure: Present     Pelvic:  Cervical exam performed Dilation: Fingertip Effacement (%): Thick Station: Ballotable   Extremities: Normal range of motion.  Edema: None  Mental Status: Normal mood and affect. Normal behavior. Normal judgment and thought content.   Urinalysis:      Assessment and Plan:  Pregnancy: [id number] at [redacted]w[redacted]d  1. Supervision of Coleman risk pregnancy, antepartum Routine care LNG IUD - Culture, beta strep (group b only) - [place] MFM FETAL BPP WO NON STRESS; Future - [place] MFM MCA DOPPLER; Future - GBS*LC  2. Trichomonal vaginitis during pregnancy in third trimester Toc, gc/ct neg in mau 12/1  3. Anemia affecting pregnancy in third trimester S/p IV feraheme on 11/17, 11/20 and 12/1  4. History of cesarean delivery Pt states she had a 2017 c-section at 30wks due to what sounds like poly and fetal heart issues. She states she was never told she couldn't labor in the future. R/b d/w her. Desires tolac. Form signed today  5. Cannabis hyperemesis syndrome concurrent with and due to cannabis abuse ([name])  6. History of decreased fetal movements Patient isn't having a change from her baseline today, in terms of her fetal movement Plan was for patient to get NST today and come back tomorrow for BPP, MCA dopplers and final delivery planning after BPP is done and this was discussed with her; patient did not stay for NST today.   From Julie Coleman on 12/23 via inbasketRhina Coleman.  You saw Julie Coleman yesterday. She missed her previous appointments with [place]. She frequently comes to the MAU with decreased fetal movements.   I don't see any ultrasound appointments. She can have NST or BPP (Julie Coleman) when she sees Dr. Ilda Coleman on 12/19/19. I am copying this message to him.   Julie Coleman   Persistent decreased fetal movements is a strong indication for delivery at 37 weeks (my  opinion). I have flagged her ultrasound chart to get MCA Doppler (to rule out fetal anemia as fetomaternal hemorrhage is one rare cause of DFM).  If NST is reactive and she has good fetal movements, we need not do MCA. I briefly  discussed her case with Julie Coleman today.  Julie Coleman   Preterm labor symptoms and general obstetric precautions including but not limited to vaginal bleeding, contractions, leaking of fluid and fetal movement were reviewed in detail with the patient. Please refer to After Visit Summary for other counseling recommendations.  Return in about 1 day (around 12/20/2019).   Julie [place] Bing, MD

## 2019-12-20 ENCOUNTER — Inpatient Hospital Stay (HOSPITAL_COMMUNITY): Payer: Medicaid Other

## 2019-12-20 ENCOUNTER — Other Ambulatory Visit: Payer: Self-pay | Admitting: Advanced Practice Midwife

## 2019-12-20 ENCOUNTER — Encounter: Payer: Medicaid Other | Admitting: Obstetrics & Gynecology

## 2019-12-20 ENCOUNTER — Encounter (HOSPITAL_COMMUNITY): Payer: Self-pay | Admitting: Obstetrics & Gynecology

## 2019-12-20 ENCOUNTER — Inpatient Hospital Stay (HOSPITAL_COMMUNITY)
Admission: AD | Admit: 2019-12-20 | Discharge: 2019-12-20 | Disposition: A | Discharge status: Home / Self Care | Payer: Medicaid Other | Attending: Obstetrics & Gynecology | Admitting: Obstetrics & Gynecology

## 2019-12-20 ENCOUNTER — Ambulatory Visit (HOSPITAL_COMMUNITY): Payer: Medicaid Other

## 2019-12-20 ENCOUNTER — Inpatient Hospital Stay (HOSPITAL_BASED_OUTPATIENT_CLINIC_OR_DEPARTMENT_OTHER): Payer: Medicaid Other

## 2019-12-20 ENCOUNTER — Ambulatory Visit (HOSPITAL_COMMUNITY)
Admission: RE | Admit: 2019-12-20 | Payer: Medicaid Other | Source: Ambulatory Visit | Attending: Obstetrics and Gynecology | Admitting: Obstetrics and Gynecology

## 2019-12-20 DIAGNOSIS — Z8632 Personal history of gestational diabetes: Secondary | ICD-10-CM | POA: Diagnosis not present

## 2019-12-20 DIAGNOSIS — O98313 Other infections with a predominantly sexual mode of transmission complicating pregnancy, third trimester: Secondary | ICD-10-CM | POA: Diagnosis not present

## 2019-12-20 DIAGNOSIS — O4703 False labor before 37 completed weeks of gestation, third trimester: Secondary | ICD-10-CM | POA: Diagnosis present

## 2019-12-20 DIAGNOSIS — Z888 Allergy status to other drugs, medicaments and biological substances status: Secondary | ICD-10-CM | POA: Diagnosis not present

## 2019-12-20 DIAGNOSIS — Z79899 Other long term (current) drug therapy: Secondary | ICD-10-CM | POA: Diagnosis not present

## 2019-12-20 DIAGNOSIS — U071 COVID-19: Secondary | ICD-10-CM | POA: Insufficient documentation

## 2019-12-20 DIAGNOSIS — O368130 Decreased fetal movements, third trimester, not applicable or unspecified: Secondary | ICD-10-CM

## 2019-12-20 DIAGNOSIS — Z2839 Other underimmunization status: Secondary | ICD-10-CM

## 2019-12-20 DIAGNOSIS — Z283 Underimmunization status: Secondary | ICD-10-CM

## 2019-12-20 DIAGNOSIS — O09899 Supervision of other high risk pregnancies, unspecified trimester: Secondary | ICD-10-CM

## 2019-12-20 DIAGNOSIS — Z3689 Encounter for other specified antenatal screening: Secondary | ICD-10-CM

## 2019-12-20 DIAGNOSIS — Z3A36 36 weeks gestation of pregnancy: Secondary | ICD-10-CM

## 2019-12-20 DIAGNOSIS — D649 Anemia, unspecified: Secondary | ICD-10-CM | POA: Insufficient documentation

## 2019-12-20 DIAGNOSIS — Z3A37 37 weeks gestation of pregnancy: Secondary | ICD-10-CM

## 2019-12-20 DIAGNOSIS — O34219 Maternal care for unspecified type scar from previous cesarean delivery: Secondary | ICD-10-CM | POA: Insufficient documentation

## 2019-12-20 DIAGNOSIS — O0990 Supervision of high risk pregnancy, unspecified, unspecified trimester: Secondary | ICD-10-CM

## 2019-12-20 DIAGNOSIS — O0993 Supervision of high risk pregnancy, unspecified, third trimester: Secondary | ICD-10-CM | POA: Insufficient documentation

## 2019-12-20 DIAGNOSIS — O210 Mild hyperemesis gravidarum: Secondary | ICD-10-CM | POA: Diagnosis not present

## 2019-12-20 DIAGNOSIS — A5901 Trichomonal vulvovaginitis: Secondary | ICD-10-CM | POA: Diagnosis not present

## 2019-12-20 DIAGNOSIS — O99013 Anemia complicating pregnancy, third trimester: Secondary | ICD-10-CM | POA: Diagnosis not present

## 2019-12-20 DIAGNOSIS — O99513 Diseases of the respiratory system complicating pregnancy, third trimester: Secondary | ICD-10-CM | POA: Insufficient documentation

## 2019-12-20 DIAGNOSIS — O163 Unspecified maternal hypertension, third trimester: Secondary | ICD-10-CM | POA: Diagnosis not present

## 2019-12-20 DIAGNOSIS — Z9049 Acquired absence of other specified parts of digestive tract: Secondary | ICD-10-CM | POA: Diagnosis not present

## 2019-12-20 DIAGNOSIS — O23593 Infection of other part of genital tract in pregnancy, third trimester: Secondary | ICD-10-CM

## 2019-12-20 DIAGNOSIS — J45909 Unspecified asthma, uncomplicated: Secondary | ICD-10-CM | POA: Diagnosis not present

## 2019-12-20 DIAGNOSIS — O365930 Maternal care for other known or suspected poor fetal growth, third trimester, not applicable or unspecified: Secondary | ICD-10-CM | POA: Diagnosis not present

## 2019-12-20 DIAGNOSIS — O98513 Other viral diseases complicating pregnancy, third trimester: Secondary | ICD-10-CM | POA: Diagnosis not present

## 2019-12-20 DIAGNOSIS — O479 False labor, unspecified: Secondary | ICD-10-CM

## 2019-12-20 LAB — URINALYSIS, ROUTINE W REFLEX MICROSCOPIC
Bilirubin Urine: NEGATIVE
Glucose, UA: NEGATIVE mg/dL
Hgb urine dipstick: NEGATIVE
Ketones, ur: NEGATIVE mg/dL
Leukocytes,Ua: NEGATIVE
Nitrite: NEGATIVE
Protein, ur: 30 mg/dL — AB
Specific Gravity, Urine: 1.014 (ref 1.005–1.030)
pH: 7 (ref 5.0–8.0)

## 2019-12-20 LAB — SARS CORONAVIRUS 2 (TAT 6-24 HRS): SARS Coronavirus 2: POSITIVE — AB

## 2019-12-20 MED ORDER — ONDANSETRON 4 MG PO TBDP: 4.0000 mg | ORAL_TABLET | Freq: Three times a day (TID) | ORAL | 0 refills | Status: DC | PRN

## 2019-12-20 MED ORDER — GLYCOPYRROLATE 0.2 MG/ML IJ SOLN
0.2000 mg | Freq: Once | INTRAMUSCULAR | Status: AC
  Administered 2019-12-20: 0.2 mg via INTRAVENOUS
  Filled 2019-12-20: qty 1

## 2019-12-20 MED ORDER — ONDANSETRON 4 MG PO TBDP
8.0000 mg | ORAL_TABLET | Freq: Once | ORAL | Status: AC
  Administered 2019-12-20: 12:00:00 8 mg via ORAL
  Filled 2019-12-20: qty 2

## 2019-12-20 NOTE — MAU Note (Signed)
EMS arrival, c/o nausea and intermittent ctx's.   BP elevated 160/90.  EMS started an IV, gave her zofran.  Pt had told them she is for induction today at [place]

## 2019-12-20 NOTE — Discharge Instructions (Signed)
Reasons to return to MAU at [facility]:  1.  Contractions are  5 minutes apart or less, each last 1 minute, these have been going on for 1-2 hours, and you cannot walk or talk during them 2.  You have a large gush of fluid, or a trickle of fluid that will not stop and you have to wear a pad 3.  You have bleeding that is bright red, heavier than spotting--like menstrual bleeding (spotting can be normal in early labor or after a check of your cervix) 4.  You do not feel the baby moving like he/she normally does  

## 2019-12-20 NOTE — MAU Note (Signed)
Pt is reporting decreased fetal movement in assessment.

## 2019-12-20 NOTE — MAU Provider Note (Addendum)
Chief Complaint:  Contractions, Nausea, and Hypertension   First Provider Initiated Contact with Patient 12/20/19 1008      HPI: Julie Coleman is a 28 y.o. [id number] at [redacted]w[redacted]d by LMP who presents to maternity admissions via EMS reporting contractions, nausea, and high blood pressure.  She reports her irregular contractions have been going on but became more painful this morning. She has an appointment with MFM for BPP today but came to the hospital so missed this appointment.  She has passed her mucus plug today.  She reports nausea and spitting that are usual for her, no changes today. She has not tried any treatments. There are no other symptoms.  She is feeling fetal movement today and reports it is unchanged from the usual amount.  She has reported decreased fetal movement frequently during this pregnancy.  HPI  Past Medical History: Past Medical History:  Diagnosis Date  . Asthma    albuteral inhaler  . Gestational diabetes   . Hypertension     Past obstetric history: OB History  Gravida Para Term Preterm AB Living  6 3 2 1 2 3   SAB TAB Ectopic Multiple Live Births  1 1     3     # Outcome Date GA Lbr Len/2nd Weight Sex Delivery Anes PTL Lv  6 Current           5 TAB 05/20/18          4 Preterm 02/16/16 [redacted]w[redacted]d    CS-Unspec     3 Term 05/22/12 [redacted]w[redacted]d    Vag-Spont     2 SAB 11/07/10             Birth Comments: Records report this was a 22 week stillbirth due to abruption  1 Term 05/29/10 [redacted]w[redacted]d    Vag-Spont       Past Surgical History: Past Surgical History:  Procedure Laterality Date  . CESAREAN SECTION    . CHOLECYSTECTOMY      Family History: History reviewed. No pertinent family history.  Social History: Social History   Tobacco Use  . Smoking status: Never Smoker  . Smokeless tobacco: Never Used  Substance Use Topics  . Alcohol use: Not Currently  . Drug use: Yes    Types: Marijuana    Comment: last use yesterday 12/19/2019    Allergies:  Allergies   Allergen Reactions  . Phenergan [Promethazine Hcl]     Tardive dyskinesia     Meds:  No medications prior to admission.    ROS:  Review of Systems  Constitutional: Negative for chills, fatigue and fever.  Eyes: Negative for visual disturbance.  Respiratory: Negative for shortness of breath.   Cardiovascular: Negative for chest pain.  Gastrointestinal: Negative for abdominal pain, nausea and vomiting.  Genitourinary: Negative for difficulty urinating, dysuria, flank pain, pelvic pain, vaginal bleeding, vaginal discharge and vaginal pain.  Neurological: Negative for dizziness and headaches.  Psychiatric/Behavioral: Negative.      I have reviewed patient's Past Medical Hx, Surgical Hx, Family Hx, Social Hx, medications and allergies.   Physical Exam   Patient Vitals for the past 24 hrs:  BP Temp Temp src Pulse Resp SpO2  12/20/19 1210 133/77 -- -- -- -- --  12/20/19 0946 113/67 -- -- 79 -- --  12/20/19 0933 112/61 -- -- 74 -- --  12/20/19 0926 121/76 -- -- 76 -- 99 %  12/20/19 0908 124/73 98 F (36.7 C) Oral 81 18 100 %   Constitutional: Well-developed, well-nourished female  in no acute distress.  Cardiovascular: normal rate Respiratory: normal effort GI: Abd soft, non-tender, gravid appropriate for gestational age.  MS: Extremities nontender, no edema, normal ROM Neurologic: Alert and oriented x 4.  GU: Neg CVAT.  PELVIC EXAM: Cervix pink, visually closed, without lesion, scant white creamy discharge, vaginal walls and external genitalia normal Bimanual exam: Cervix 0/long/high, firm, anterior, neg CMT, uterus nontender, nonenlarged, adnexa without tenderness, enlargement, or mass  Dilation: 1 Effacement (%): 50 Cervical Position: Posterior Exam by:: L. Leftwich-Kirby, CNM  FHT:  Baseline 135 , moderate variability, accelerations present, no decelerations Contractions: q 5-8 mins, irregular   Labs: Results for orders placed or performed during the hospital  encounter of 12/20/19 (from the past 24 hour(s))  Urinalysis, Routine w reflex microscopic     Status: Abnormal   Collection Time: 12/20/19  9:28 AM  Result Value Ref Range   Color, Urine YELLOW YELLOW   APPearance HAZY (A) CLEAR   Specific Gravity, Urine 1.014 1.005 - 1.030   pH 7.0 5.0 - 8.0   Glucose, UA NEGATIVE NEGATIVE mg/dL   Hgb urine dipstick NEGATIVE NEGATIVE   Bilirubin Urine NEGATIVE NEGATIVE   Ketones, ur NEGATIVE NEGATIVE mg/dL   Protein, ur 30 (A) NEGATIVE mg/dL   Nitrite NEGATIVE NEGATIVE   Leukocytes,Ua NEGATIVE NEGATIVE   RBC / HPF 0-5 0 - 5 RBC/hpf   WBC, UA 0-5 0 - 5 WBC/hpf   Bacteria, UA RARE (A) NONE SEEN   Squamous Epithelial / LPF 0-5 0 - 5   Mucus PRESENT    --/--/O POS (12/01 1109)  Imaging:  [place] MFM FETAL BPP WO NON STRESS  Result Date: 12/20/2019 ----------------------------------------------------------------------  OBSTETRICS REPORT                       (Signed Final 12/20/2019 11:50 am) ---------------------------------------------------------------------- Patient Info  ID #:       [id number]                          D.O.B.:  1991-10-03 (28 yrs)  Name:       Julie Coleman                   Visit Date: 12/20/2019 10:42 am ---------------------------------------------------------------------- Performed By  Performed By:     Eden Lathe BS      Ref. Address:     [address]                    RDMS RVT                                                             Rd                                                             Jacky Kindle  Attending:        Noralee Space MD        Secondary Phy.:   [facility] MAU/Triage  Referred By:      Wilmer Floor LEFTWICH-  [facility] CN[facility] ---------------------------------------------------------------------- Orders   #  Description                          Code         Ordered By   1  [place] MFM FETAL BPP WO NON              76819.01     Lander Eslick  LEFTWICH-      STRESS                                            KIRBY   2  [place] MFM MCA DOPPLER                   [id number]     RAVI SHANKAR  ----------------------------------------------------------------------   #  Order #                    Accession #                 Episode #   1  [id number]                  [id number]   2  [id number]                  [id number]  ---------------------------------------------------------------------- Indications   Decreased fetal movements, third trimester,    O36.8130   unspecified   Hyperemesis gravidarum                         O21.0   [redacted] weeks gestation of pregnancy                Z3A.36  ---------------------------------------------------------------------- Fetal Evaluation  Num Of Fetuses:         1  Fetal Heart Rate(bpm):  146  Cardiac Activity:       Observed  Presentation:           Cephalic  Amniotic Fluid  AFI FV:      Within normal limits  AFI Sum(cm)     %Tile       Largest Pocket(cm)  8.06            9           2.9  RUQ(cm)       RLQ(cm)       LUQ(cm)        LLQ(cm)  2.9           1.38          2.03           1.75 ---------------------------------------------------------------------- Biophysical Evaluation  Amniotic F.V:   Pocket => 2 cm  F. Tone:        Observed  F. Movement:    Observed                   Score:          8/8  F. Breathing:   Observed ---------------------------------------------------------------------- OB History  Gravidity:    6         Term:   2        Prem:   1        SAB:   1  TOP:          1        Living:  3 ---------------------------------------------------------------------- Gestational Age  LMP:           36w 5d        Date:  04/07/19                 EDD:   01/12/20  Best:          36w 5d     Det. By:  LMP  (04/07/19)          EDD:   01/12/20 ---------------------------------------------------------------------- Doppler - Fetal Vessels  Middle Cerebral Artery                                                      PSV    MoM                                                   (cm/s)                                                       51   < 1 ---------------------------------------------------------------------- Impression  Patient was being evaluated the MAU for uterine  contractions.  She also complained of decreased fetal  movements over few weeks.  Amniotic fluid is normal and  good fetal activity seen.  Cephalic presentation.  Antenatal  testing is reassuring.  BPP 8/8.  Because of her decreased  fetal movements, we performed a middle cerebral artery  Doppler to rule out fetal anemia (from fetal maternal  hemorrhage).  MCA Doppler study showed normal peak  systolic velocity measurements (no evidence of fetal anemia).  I reviewed the NST, which is reactive.  Antenatal testing has limitations in predicting fetal  compromise.  In patients with intermittent decreased fetal  movements, it is reasonable to offer delivery at [redacted] weeks  gestation to prevent stillbirth.  I discussed with the MAU team. ----------------------------------------------------------------------                  Noralee Spaceavi Shankar, MD Electronically Signed Final Report   12/20/2019 11:50 am ----------------------------------------------------------------------   MAU Course/MDM: Orders Placed This Encounter  Procedures  . SARS CORONAVIRUS 2 (TAT 6-24 HRS) Nasopharyngeal Nasopharyngeal Swab  . US MFM FETAL BPP WO NON STRESS  . US MFM MCA DOPPLER  . Urinalysis, Routine w reflex microscopic  . Discharge patient    Meds ordered this encounter  Medications  . glycopyrrolate (ROBINUL) injection 0.2 mg  . ondansetron (ZOFRAN-ODT) disintegrating tablet 8 mg  . ondansetron (ZOFRAN ODT) 4 MG disintegrating tablet    Sig: Take 1 tablet (4 mg total) by mouth every 8 (eight) hours as needed for nausea or vomiting.    Dispense:  30 tablet    Refill:  0    Order Specific Question:   Supervising Provider    Answer:    Adam Phenix [[id number]]     NST reviewed and reactive No evidence of labor with irregular contractions and cervix 1/50/-3, posterior Consult Dr Debroah Loop to discuss pt missing MFM appointment today BPP 8/8, MCA dopplers done per MFM and were wnl Discussed results with Dr Judeth Cornfield and plan to d/c home today with IOL at 37.0, on 12/22/19 IOL scheduled, COVID preprocedural swab done today Zofran 8 mg ODT dose and robinul given in MAU per pt request  Pt discharge with strict return precautions.    Assessment: 1. Braxton Hicks contractions   2. Supervision of high risk pregnancy, antepartum   3. Poor fetal growth affecting management of mother in third trimester, single or unspecified fetus   4. Rubella non-immune status, antepartum   5. Trichomonal vaginitis during pregnancy in third trimester   6. Anemia affecting pregnancy in third trimester   7. Decreased fetal movement affecting management of pregnancy in third trimester   8. Decreased fetal movements in third trimester, single or unspecified fetus   9. NST (non-stress test) reactive     Plan: Discharge home Labor precautions and fetal kick counts Follow-up Information    [facility] Follow up.   Specialty: Obstetrics and Gynecology Why: The hospital will call you on Saturday, 12/22/19 to tell you when to come in for your labor induction.  Return to MAU sooner as needed for emergencies. Contact information: [address] [id number] mc [place] [address] [phone]         Allergies as of 12/20/2019      Reactions   Phenergan [promethazine Hcl]    Tardive dyskinesia       Medication List    TAKE these medications   diphenhydrAMINE 25 mg capsule Commonly known as: BENADRYL Take 1 capsule (25 mg total) by mouth every 6 (six) hours as needed (nausea).   docusate sodium 100 MG capsule Commonly known as: COLACE Take 1 capsule (100 mg total) by mouth 2 (two) times daily.    ferrous sulfate 325 (65 FE) MG tablet Take 1 tablet (325 mg total) by mouth daily.   glycopyrrolate 1 MG tablet Commonly known as: ROBINUL Take 1 tablet (1 mg total) by mouth 3 (three) times daily.   ondansetron 4 MG disintegrating tablet Commonly known as: Zofran ODT Take 1 tablet (4 mg total) by mouth every 8 (eight) hours as needed for nausea or vomiting.   pantoprazole 40 MG tablet Commonly known as: Protonix Take 1 tablet (40 mg total) by mouth 2 (two) times daily.   polyethylene glycol 17 g packet Commonly known as: MIRALAX / GLYCOLAX Take 17 g by mouth daily.   PrePLUS 27-1 MG Tabs Take 1 tablet by mouth daily.   scopolamine 1 MG/3DAYS Commonly known as: TRANSDERM-SCOP Place 1 patch (1.5 mg total) onto the skin every 3 (three) days.   sodium phosphate 7-19 GM/118ML Enem Place 133 mLs (1 enema total) rectally daily as needed for severe constipation.       Sharen Counter Certified Nurse-Midwife 12/20/2019 12:18 PM

## 2019-12-21 ENCOUNTER — Other Ambulatory Visit: Payer: Self-pay | Admitting: Advanced Practice Midwife

## 2019-12-21 ENCOUNTER — Encounter: Payer: Self-pay | Admitting: Medical

## 2019-12-21 DIAGNOSIS — U071 COVID-19: Secondary | ICD-10-CM | POA: Insufficient documentation

## 2019-12-21 DIAGNOSIS — O98513 Other viral diseases complicating pregnancy, third trimester: Secondary | ICD-10-CM | POA: Insufficient documentation

## 2019-12-21 NOTE — L&D Delivery Note (Signed)
LABOR COURSE Patient was admitted as an IOL for IUGR and DFM. She received low dose Pitocin, a foley balloon for cervical ripening and AROM and Pitocin for agumentation.   Delivery Note Called to room and patient was complete and pushing. Head delivered LOT without spontaneous restitution. No nuchal cord present. Shoulder and body delivered in usual fashion. At 1035 a viable female was delivered via Vaginal Birth After Cesarean.  Infant with spontaneous cry, placed on mother's abdomen, dried and stimulated. Cord clamped x 2 after two-minute delay, and cut by second nurse Hazel Sams. Cord blood drawn. Placenta delivered spontaneously with gentle cord traction. Appears intact. Fundus firm with massage and Pitocin. Labia, perineum, vagina, and cervix inspected with no lacerations noted.   APGAR:7,9; weight: 2481g  .   Cord: 3VC with the following complications:N/A.   Cord pH: N/A  Anesthesia:  Epidural Episiotomy: None Lacerations: B/A Q Blood Loss (mL): 140  Mom to postpartum.  Baby to Couplet care / Skin to Skin.  Postplacental IUD placed per patient request Clinic messaged to coordinate postpartum care  Clayton Bibles, [place] 12/23/19 12:18 PM

## 2019-12-22 ENCOUNTER — Inpatient Hospital Stay (HOSPITAL_COMMUNITY): Payer: Medicaid Other | Admitting: Anesthesiology

## 2019-12-22 ENCOUNTER — Other Ambulatory Visit: Payer: Self-pay

## 2019-12-22 ENCOUNTER — Inpatient Hospital Stay (HOSPITAL_COMMUNITY): Payer: Medicaid Other

## 2019-12-22 ENCOUNTER — Encounter (HOSPITAL_COMMUNITY): Payer: Self-pay | Admitting: Obstetrics & Gynecology

## 2019-12-22 ENCOUNTER — Inpatient Hospital Stay (HOSPITAL_COMMUNITY)
Admission: AD | Admit: 2019-12-22 | Discharge: 2019-12-24 | DRG: 805 | Disposition: A | Discharge status: Home / Self Care | Payer: Medicaid Other | Attending: Obstetrics and Gynecology | Admitting: Obstetrics and Gynecology

## 2019-12-22 DIAGNOSIS — O9902 Anemia complicating childbirth: Secondary | ICD-10-CM | POA: Diagnosis present

## 2019-12-22 DIAGNOSIS — Z30430 Encounter for insertion of intrauterine contraceptive device: Secondary | ICD-10-CM

## 2019-12-22 DIAGNOSIS — O368130 Decreased fetal movements, third trimester, not applicable or unspecified: Principal | ICD-10-CM | POA: Diagnosis present

## 2019-12-22 DIAGNOSIS — O134 Gestational [pregnancy-induced] hypertension without significant proteinuria, complicating childbirth: Secondary | ICD-10-CM | POA: Diagnosis present

## 2019-12-22 DIAGNOSIS — F1210 Cannabis abuse, uncomplicated: Secondary | ICD-10-CM | POA: Diagnosis present

## 2019-12-22 DIAGNOSIS — J45909 Unspecified asthma, uncomplicated: Secondary | ICD-10-CM | POA: Diagnosis present

## 2019-12-22 DIAGNOSIS — Z3A37 37 weeks gestation of pregnancy: Secondary | ICD-10-CM

## 2019-12-22 DIAGNOSIS — O9952 Diseases of the respiratory system complicating childbirth: Secondary | ICD-10-CM | POA: Diagnosis present

## 2019-12-22 DIAGNOSIS — U071 COVID-19: Secondary | ICD-10-CM | POA: Diagnosis present

## 2019-12-22 DIAGNOSIS — A5901 Trichomonal vulvovaginitis: Secondary | ICD-10-CM | POA: Diagnosis present

## 2019-12-22 DIAGNOSIS — O368190 Decreased fetal movements, unspecified trimester, not applicable or unspecified: Secondary | ICD-10-CM

## 2019-12-22 DIAGNOSIS — O98513 Other viral diseases complicating pregnancy, third trimester: Secondary | ICD-10-CM | POA: Diagnosis present

## 2019-12-22 DIAGNOSIS — O99324 Drug use complicating childbirth: Secondary | ICD-10-CM | POA: Diagnosis present

## 2019-12-22 DIAGNOSIS — O99019 Anemia complicating pregnancy, unspecified trimester: Secondary | ICD-10-CM | POA: Diagnosis present

## 2019-12-22 DIAGNOSIS — D649 Anemia, unspecified: Secondary | ICD-10-CM | POA: Diagnosis present

## 2019-12-22 DIAGNOSIS — O139 Gestational [pregnancy-induced] hypertension without significant proteinuria, unspecified trimester: Secondary | ICD-10-CM

## 2019-12-22 DIAGNOSIS — O0990 Supervision of high risk pregnancy, unspecified, unspecified trimester: Secondary | ICD-10-CM

## 2019-12-22 DIAGNOSIS — F12188 Cannabis abuse with other cannabis-induced disorder: Secondary | ICD-10-CM | POA: Diagnosis present

## 2019-12-22 DIAGNOSIS — O365930 Maternal care for other known or suspected poor fetal growth, third trimester, not applicable or unspecified: Secondary | ICD-10-CM | POA: Diagnosis present

## 2019-12-22 DIAGNOSIS — Z98891 History of uterine scar from previous surgery: Secondary | ICD-10-CM

## 2019-12-22 DIAGNOSIS — O09899 Supervision of other high risk pregnancies, unspecified trimester: Secondary | ICD-10-CM

## 2019-12-22 DIAGNOSIS — O34219 Maternal care for unspecified type scar from previous cesarean delivery: Secondary | ICD-10-CM | POA: Diagnosis present

## 2019-12-22 DIAGNOSIS — O9852 Other viral diseases complicating childbirth: Secondary | ICD-10-CM | POA: Diagnosis present

## 2019-12-22 DIAGNOSIS — O365990 Maternal care for other known or suspected poor fetal growth, unspecified trimester, not applicable or unspecified: Secondary | ICD-10-CM | POA: Diagnosis present

## 2019-12-22 DIAGNOSIS — Z283 Underimmunization status: Secondary | ICD-10-CM

## 2019-12-22 HISTORY — DX: Decreased fetal movements, unspecified trimester, not applicable or unspecified: O36.8190

## 2019-12-22 LAB — COMPREHENSIVE METABOLIC PANEL
ALT: 18 U/L (ref 0–44)
AST: 18 U/L (ref 15–41)
Albumin: 3.2 g/dL — ABNORMAL LOW (ref 3.5–5.0)
Alkaline Phosphatase: 65 U/L (ref 38–126)
Anion gap: 9 (ref 5–15)
BUN: 6 mg/dL (ref 6–20)
CO2: 27 mmol/L (ref 22–32)
Calcium: 8.8 mg/dL — ABNORMAL LOW (ref 8.9–10.3)
Chloride: 102 mmol/L (ref 98–111)
Creatinine, Ser: 0.76 mg/dL (ref 0.44–1.00)
GFR calc Af Amer: >60 mL/min (ref >60)
GFR calc non Af Amer: >60 mL/min (ref >60)
Glucose, Bld: 83 mg/dL (ref 70–99)
Potassium: 3 mmol/L — ABNORMAL LOW (ref 3.5–5.1)
Sodium: 138 mmol/L (ref 135–145)
Total Bilirubin: 0.7 mg/dL (ref 0.3–1.2)
Total Protein: 6.2 g/dL — ABNORMAL LOW (ref 6.5–8.1)

## 2019-12-22 LAB — CBC
HCT: 32.2 % — ABNORMAL LOW (ref 36.0–46.0)
Hemoglobin: 10.9 g/dL — ABNORMAL LOW (ref 12.0–15.0)
MCH: 29.5 pg (ref 26.0–34.0)
MCHC: 33.9 g/dL (ref 30.0–36.0)
MCV: 87 fL (ref 80.0–100.0)
Platelets: 176 10*3/uL (ref 150–400)
RBC: 3.7 MIL/uL — ABNORMAL LOW (ref 3.87–5.11)
RDW: 20.6 % — ABNORMAL HIGH (ref 11.5–15.5)
WBC: 7.3 10*3/uL (ref 4.0–10.5)
nRBC: 0 % (ref 0.0–0.2)

## 2019-12-22 LAB — GROUP B STREP BY PCR: Group B strep by PCR: POSITIVE — AB

## 2019-12-22 LAB — GLUCOSE, CAPILLARY: Glucose-Capillary: 74 mg/dL (ref 70–99)

## 2019-12-22 LAB — RAPID URINE DRUG SCREEN, HOSP PERFORMED
Amphetamines: NOT DETECTED
Barbiturates: NOT DETECTED
Benzodiazepines: NOT DETECTED
Cocaine: NOT DETECTED
Opiates: NOT DETECTED
Tetrahydrocannabinol: POSITIVE — AB

## 2019-12-22 LAB — TYPE AND SCREEN
ABO/RH(D): O POS
Antibody Screen: NEGATIVE

## 2019-12-22 LAB — PROTEIN / CREATININE RATIO, URINE
Creatinine, Urine: 383.89 mg/dL
Protein Creatinine Ratio: 0.16 mg/mg{Cre} — ABNORMAL HIGH (ref 0.00–0.15)
Total Protein, Urine: 62 mg/dL

## 2019-12-22 MED ORDER — LIDOCAINE HCL (PF) 1 % IJ SOLN: 30.0000 mL | INTRAMUSCULAR | Status: DC | PRN

## 2019-12-22 MED ORDER — LACTATED RINGERS IV SOLN: 500.0000 mL | Freq: Once | INTRAVENOUS | Status: DC

## 2019-12-22 MED ORDER — FENTANYL CITRATE (PF) 100 MCG/2ML IJ SOLN
100.0000 ug | INTRAMUSCULAR | Status: DC | PRN
  Administered 2019-12-22: 14:00:00 100 ug via INTRAVENOUS

## 2019-12-22 MED ORDER — ACETAMINOPHEN 325 MG PO TABS: 650.0000 mg | ORAL_TABLET | ORAL | Status: DC | PRN

## 2019-12-22 MED ORDER — PENICILLIN G POT IN DEXTROSE 60000 UNIT/ML IV SOLN
3.0000 10*6.[IU] | INTRAVENOUS | Status: DC
  Administered 2019-12-23 (×3): 3 10*6.[IU] via INTRAVENOUS
  Filled 2019-12-22 (×5): qty 50

## 2019-12-22 MED ORDER — PHENYLEPHRINE 40 MCG/ML (10ML) SYRINGE FOR IV PUSH (FOR BLOOD PRESSURE SUPPORT): 80.0000 ug | PREFILLED_SYRINGE | INTRAVENOUS | Status: DC | PRN

## 2019-12-22 MED ORDER — ONDANSETRON HCL 4 MG/2ML IJ SOLN
4.0000 mg | Freq: Four times a day (QID) | INTRAMUSCULAR | Status: DC | PRN
  Administered 2019-12-22: 4 mg via INTRAVENOUS
  Filled 2019-12-22: qty 2

## 2019-12-22 MED ORDER — FENTANYL CITRATE (PF) 100 MCG/2ML IJ SOLN
INTRAMUSCULAR | Status: AC
  Filled 2019-12-22: qty 2

## 2019-12-22 MED ORDER — SODIUM CHLORIDE (PF) 0.9 % IJ SOLN
INTRAMUSCULAR | Status: DC | PRN
  Administered 2019-12-22: 12 mL/h via EPIDURAL

## 2019-12-22 MED ORDER — EPHEDRINE 5 MG/ML INJ: 10.0000 mg | INTRAVENOUS | Status: DC | PRN

## 2019-12-22 MED ORDER — OXYTOCIN BOLUS FROM INFUSION
500.0000 mL | Freq: Once | INTRAVENOUS | Status: AC
  Administered 2019-12-23: 500 mL via INTRAVENOUS

## 2019-12-22 MED ORDER — SODIUM CHLORIDE 0.9 % IV SOLN
5.0000 10*6.[IU] | Freq: Once | INTRAVENOUS | Status: AC
  Administered 2019-12-22: 5 10*6.[IU] via INTRAVENOUS
  Filled 2019-12-22: qty 5

## 2019-12-22 MED ORDER — LACTATED RINGERS IV SOLN: INTRAVENOUS | Status: DC

## 2019-12-22 MED ORDER — ONDANSETRON HCL 4 MG/2ML IJ SOLN
4.0000 mg | INTRAMUSCULAR | Status: DC | PRN
  Administered 2019-12-22 – 2019-12-23 (×3): 4 mg via INTRAVENOUS
  Filled 2019-12-22 (×3): qty 2

## 2019-12-22 MED ORDER — DIPHENHYDRAMINE HCL 50 MG/ML IJ SOLN
12.5000 mg | INTRAMUSCULAR | Status: DC | PRN
  Administered 2019-12-23 (×2): 12.5 mg via INTRAVENOUS
  Filled 2019-12-22 (×2): qty 1

## 2019-12-22 MED ORDER — TERBUTALINE SULFATE 1 MG/ML IJ SOLN: 0.2500 mg | Freq: Once | INTRAMUSCULAR | Status: DC | PRN

## 2019-12-22 MED ORDER — OXYTOCIN 40 UNITS IN NORMAL SALINE INFUSION - SIMPLE MED
1.0000 m[IU]/min | INTRAVENOUS | Status: DC
  Administered 2019-12-22: 11:00:00 2 m[IU]/min via INTRAVENOUS
  Filled 2019-12-22: qty 1000

## 2019-12-22 MED ORDER — LACTATED RINGERS IV SOLN
500.0000 mL | INTRAVENOUS | Status: DC | PRN
  Administered 2019-12-22: 14:00:00 500 mL via INTRAVENOUS

## 2019-12-22 MED ORDER — OXYCODONE-ACETAMINOPHEN 5-325 MG PO TABS: 2.0000 | ORAL_TABLET | ORAL | Status: DC | PRN

## 2019-12-22 MED ORDER — FENTANYL-BUPIVACAINE-NACL 0.5-0.125-0.9 MG/250ML-% EP SOLN
EPIDURAL | Status: AC
  Filled 2019-12-22: qty 250

## 2019-12-22 MED ORDER — LIDOCAINE HCL (PF) 1 % IJ SOLN
INTRAMUSCULAR | Status: DC | PRN
  Administered 2019-12-22: 5 mL via EPIDURAL
  Administered 2019-12-22: 3 mL via EPIDURAL
  Administered 2019-12-22: 2 mL via EPIDURAL

## 2019-12-22 MED ORDER — OXYCODONE-ACETAMINOPHEN 5-325 MG PO TABS: 1.0000 | ORAL_TABLET | ORAL | Status: DC | PRN

## 2019-12-22 MED ORDER — OXYTOCIN 40 UNITS IN NORMAL SALINE INFUSION - SIMPLE MED: 2.5000 [IU]/h | INTRAVENOUS | Status: DC

## 2019-12-22 MED ORDER — LEVONORGESTREL 19.5 MCG/DAY IU IUD
INTRAUTERINE_SYSTEM | Freq: Once | INTRAUTERINE | Status: AC
  Administered 2019-12-23: 1 via INTRAUTERINE
  Filled 2019-12-22: qty 1

## 2019-12-22 MED ORDER — FENTANYL-BUPIVACAINE-NACL 0.5-0.125-0.9 MG/250ML-% EP SOLN
12.0000 mL/h | EPIDURAL | Status: DC | PRN
  Administered 2019-12-23: 12 mL/h via EPIDURAL
  Filled 2019-12-22: qty 250

## 2019-12-22 MED ORDER — PHENYLEPHRINE 40 MCG/ML (10ML) SYRINGE FOR IV PUSH (FOR BLOOD PRESSURE SUPPORT)
PREFILLED_SYRINGE | INTRAVENOUS | Status: AC
  Filled 2019-12-22: qty 10

## 2019-12-22 MED ORDER — SOD CITRATE-CITRIC ACID 500-334 MG/5ML PO SOLN: 30.0000 mL | ORAL | Status: DC | PRN

## 2019-12-22 NOTE — H&P (Addendum)
OBSTETRIC ADMISSION HISTORY AND PHYSICAL  Julie Coleman is a 29 y.o. female [phone] with IUP at [redacted]w[redacted]d presenting for IOL 2/2 DFM, mild IUGR. Her pregnancy has been complicated by multiple admissions for hyperemesis. She reports +FMs. No LOF, VB, blurry vision, headaches, peripheral edema, or RUQ pain. She plans on bottlefeeding. She requests post placental IUD for birth control.  Dating: By [place] at 28 weeks --->  Estimated Date of Delivery: 01/12/20  Sono:   @[redacted]w[redacted]d , normal anatomy, cephalic presentation, 1751g, , EFW 3#14 -cerebellum and posterior fossa not well seen -abdominal wall not well visualized  Prenatal History/Complications: IUGR Insufficient prenatal care Multiple admissions for hyperemesis History of Cesarean delivery (30 weeks- poly and DiGeorge syndrome/cardiac issue) H/o gestational diabetes, not with this pregnancy Trichomonal vaginitis- TOC 12/1 neg Cannabis hyperemesis Anemia- s/p feraheme 11/17, 11/20, 12/1  Past Medical History: Past Medical History:  Diagnosis Date  . Asthma    albuteral inhaler  . Gestational diabetes   . Hypertension     Past Surgical History: Past Surgical History:  Procedure Laterality Date  . CESAREAN SECTION    . CHOLECYSTECTOMY      Obstetrical History: OB History    Gravida  6   Para  3   Term  2   Preterm  1   AB  2   Living  3     SAB  1   TAB  1   Ectopic      Multiple      Live Births  3           Social History: Social History   Socioeconomic History  . Marital status: Single    Spouse name: Not on file  . Number of children: Not on file  . Years of education: Not on file  . Highest education level: Not on file  Occupational History  . Not on file  Tobacco Use  . Smoking status: Never Smoker  . Smokeless tobacco: Never Used  Substance and Sexual Activity  . Alcohol use: Not Currently  . Drug use: Yes    Types: Marijuana    Comment: last use yesterday 12/21/2019  . Sexual activity:  Not Currently    Birth control/protection: None  Other Topics Concern  . Not on file  Social History Narrative  . Not on file   Social Determinants of Health   Financial Resource Strain:   . Difficulty of Paying Living Expenses: Not on file  Food Insecurity: Food Insecurity Present  . Worried About 02/18/2020 in the Last Year: Sometimes true  . Ran Out of Food in the Last Year: Sometimes true  Transportation Needs: Unmet Transportation Needs  . Lack of Transportation (Medical): Yes  . Lack of Transportation (Non-Medical): Yes  Physical Activity:   . Days of Exercise per Week: Not on file  . Minutes of Exercise per Session: Not on file  Stress:   . Feeling of Stress : Not on file  Social Connections:   . Frequency of Communication with Friends and Family: Not on file  . Frequency of Social Gatherings with Friends and Family: Not on file  . Attends Religious Services: Not on file  . Active Member of Clubs or Organizations: Not on file  . Attends Programme researcher, broadcasting/film/video Meetings: Not on file  . Marital Status: Not on file    Family History: History reviewed. No pertinent family history.  Allergies: Allergies  Allergen Reactions  . Phenergan [Promethazine Hcl]  Tardive dyskinesia     Medications Prior to Admission  Medication Sig Dispense Refill Last Dose  . diphenhydrAMINE (BENADRYL) 25 mg capsule Take 1 capsule (25 mg total) by mouth every 6 (six) hours as needed (nausea). (Patient not taking: Reported on 12/04/2019) 30 capsule 0   . docusate sodium (COLACE) 100 MG capsule Take 1 capsule (100 mg total) by mouth 2 (two) times daily. 60 capsule 0   . ferrous sulfate 325 (65 FE) MG tablet Take 1 tablet (325 mg total) by mouth daily. 30 tablet 3   . glycopyrrolate (ROBINUL) 1 MG tablet Take 1 tablet (1 mg total) by mouth 3 (three) times daily. 60 tablet 1   . ondansetron (ZOFRAN ODT) 4 MG disintegrating tablet Take 1 tablet (4 mg total) by mouth every 8 (eight) hours  as needed for nausea or vomiting. 30 tablet 0   . pantoprazole (PROTONIX) 40 MG tablet Take 1 tablet (40 mg total) by mouth 2 (two) times daily. 60 tablet 1   . polyethylene glycol (MIRALAX / GLYCOLAX) 17 g packet Take 17 g by mouth daily. (Patient not taking: Reported on 12/19/2019) 14 each 0   . Prenatal Vit-Fe Fumarate-FA (PREPLUS) 27-1 MG TABS Take 1 tablet by mouth daily. 30 tablet 13   . scopolamine (TRANSDERM-SCOP) 1 MG/3DAYS Place 1 patch (1.5 mg total) onto the skin every 3 (three) days. 10 patch 12   . sodium phosphate (FLEET) 7-19 GM/118ML ENEM Place 133 mLs (1 enema total) rectally daily as needed for severe constipation. 133 mL 5      Review of Systems:  All systems reviewed and negative except as stated in HPI  PE: Height 5\' 7"  (1.702 m), weight 78.4 kg, last menstrual period 04/07/2019. General appearance: alert and cooperative Lungs: regular rate and effort Heart: regular rate  Abdomen: soft, non-tender Extremities: Homans sign is negative, no sign of DVT Presentation: cephalic EFM: 130 bpm, moderate variability, 15x15 accels, no decels Toco: irregular contractions Dilation: Fingertip Effacement (%): 50 Station: -3 Exam by:: [email or web address], RNC  Prenatal labs: ABO, Rh: --/--/O POS (01/02 1017) Antibody: NEG (01/02 1017) Rubella: 0.92 (11/17 1053) RPR: NON REACTIVE (11/17 1053)  HBsAg: NEGATIVE (11/17 1053)  HIV: Non Reactive (11/17 1053)  GBS:   unknown 2 hr GTT not done  Prenatal Transfer Tool  Maternal Diabetes: Unknown Genetic Screening: Declined Maternal Ultrasounds/Referrals: IUGR Fetal Ultrasounds or other Referrals:  Referred to Materal Fetal Medicine  Maternal Substance Abuse:  Yes:  Type: Marijuana Significant Maternal Medications:  None Significant Maternal Lab Results: unknown  Results for orders placed or performed during the hospital encounter of 12/22/19 (from the past 24 hour(s))  CBC   Collection Time: 12/22/19 10:17 AM  Result Value  Ref Range   WBC 7.3 4.0 - 10.5 K/uL   RBC 3.70 (L) 3.87 - 5.11 MIL/uL   Hemoglobin 10.9 (L) 12.0 - 15.0 g/dL   HCT 02/19/20 (L) 44.8 - 18.5 %   MCV 87.0 80.0 - 100.0 fL   MCH 29.5 26.0 - 34.0 pg   MCHC 33.9 30.0 - 36.0 g/dL   RDW 63.1 (H) 49.7 - 02.6 %   Platelets 176 150 - 400 K/uL   nRBC 0.0 0.0 - 0.2 %  Type and screen   Collection Time: 12/22/19 10:17 AM  Result Value Ref Range   ABO/RH(D) O POS    Antibody Screen NEG    Sample Expiration      12/25/2019,2359 Performed at [facility], [address] [place], [place] [address]   Glucose, capillary   Collection Time: 12/22/19 10:31 AM  Result Value Ref Range   Glucose-Capillary 74 70 - 99 mg/dL    Patient Active Problem List   Diagnosis Date Noted  . Decreased fetal movement 12/22/2019  . COVID-19 affecting pregnancy in third trimester 12/21/2019  . History of cesarean delivery 12/19/2019  . History of decreased fetal movements 12/19/2019  . Supervision of high risk pregnancy, antepartum 11/21/2019  . Hyperemesis 11/20/2019  . IUGR (intrauterine growth restriction) affecting care of mother 11/20/2019  . Hyperemesis affecting pregnancy, antepartum 11/13/2019  . Rubella non-immune status, antepartum 11/12/2019  . Cannabis hyperemesis syndrome concurrent with and due to cannabis abuse ([place]) 11/09/2019  . Nausea and vomiting in pregnancy 11/08/2019  . Heartburn during pregnancy, antepartum 10/25/2019  . Trichomonal vaginitis during pregnancy 10/22/2019  . Anemia affecting pregnancy 10/22/2019    Assessment: Julie Coleman is a 29 y.o. [id number] at [redacted]w[redacted]d here for IOL 2/2 DFM and IUGR  1. Labor: Start with pitocin, consider FB if able. AROM as appropriate. 2. FWB: Cat I tracing. 3. Pain: per patient request 4. GBS: unknown, will collect PCR swab   Plan: Risks and benefits of induction were reviewed, including failure of method, prolonged labor, need for further intervention, risk of cesarean.  Patient and family seem  to understand these risks and wish to proceed. Options of foley bulb, AROM, and pitocin reviewed, with use of each discussed.  Anticipate vaginal delivery, CS as appropriate  Ellionna Buckbee L Joshuajames Moehring, DO  12/22/2019, 11:30 AM

## 2019-12-22 NOTE — Anesthesia Procedure Notes (Signed)
Epidural Patient location during procedure: OB Start time: 12/22/2019 2:37 PM End time: 12/22/2019 2:45 PM  Staffing Anesthesiologist: Cecile Hearing, MD Performed: anesthesiologist   Preanesthetic Checklist Completed: patient identified, IV checked, risks and benefits discussed, monitors and equipment checked, pre-op evaluation and timeout performed  Epidural Patient position: sitting Prep: DuraPrep Patient monitoring: blood pressure and continuous pulse ox Approach: midline Location: L3-L4 Injection technique: LOR air  Needle:  Needle type: Tuohy  Needle gauge: 17 G Needle length: 9 cm Needle insertion depth: 5 cm Catheter size: 19 Gauge Catheter at skin depth: 10 cm Test dose: negative and Other (1% Lidocaine)  Additional Notes Patient identified.  Risk benefits discussed including failed block, incomplete pain control, headache, nerve damage, paralysis, blood pressure changes, nausea, vomiting, reactions to medication both toxic or allergic, and postpartum back pain.  Patient expressed understanding and wished to proceed.  All questions were answered.  Sterile technique used throughout procedure and epidural site dressed with sterile barrier dressing. No paresthesia or other complications noted. The patient did not experience any signs of intravascular injection such as tinnitus or metallic taste in mouth nor signs of intrathecal spread such as rapid motor block. Please see nursing notes for vital signs. Reason for block:procedure for pain

## 2019-12-22 NOTE — Progress Notes (Signed)
Julie Coleman is a 29 y.o. [id number] at [redacted]w[redacted]d admitted for induction of labor due to [facility] and IUGR.  Subjective: Feeling pain with contractions, desires epidural  Objective: BP (!) 144/85   Pulse 82   Resp 18   Ht 5\' 7"  (1.702 m)   Wt 78.4 kg   LMP 04/07/2019   BMI 27.06 kg/m  No intake/output data recorded.  FHT:  FHR: 130 bpm, variability: moderate,  accelerations:  Present,  decelerations:  Absent UC:   regular, every 3-4 minutes  SVE:   Dilation: Fingertip Effacement (%): 50 Station: -2 Exam by:: Dr. [email or web address]  Pitocin @ 10 mu/min  Labs: Lab Results  Component Value Date   WBC 7.3 12/22/2019   HGB 10.9 (L) 12/22/2019   HCT 32.2 (L) 12/22/2019   MCV 87.0 12/22/2019   PLT 176 12/22/2019    Assessment / Plan: Julie Coleman is a 29 y.o. 26 at [redacted]w[redacted]d here for IOL 2/2 DFM and IUGR  Labor: Pit at 10. FB placed with bloody flashback, continue to monitor. AROM when appropriate. Continue to titrate pitocin. Fetal Wellbeing:  Category I Pain Control:  Epidural I/D:  GBS swab collected Anticipated MOD:  vaginal, CS as appropriate  [redacted]w[redacted]d DO OB Fellow, [facility] 12/22/2019, 2:26 PM

## 2019-12-22 NOTE — Progress Notes (Signed)
   Julie Coleman is a 29 y.o. [phone] at [redacted]w[redacted]d  admitted for IOL for IUGR and decreased fetal movements.   Subjective: She reports some nausea, requesting medicine for nausea.   Objective: Vitals:   12/22/19 1930 12/22/19 2000 12/22/19 2030 12/22/19 2100  BP: 124/71 113/62 121/78 122/68  Pulse: 66 76 85 77  Resp: 16 16    Temp:  98.1 F (36.7 C)    TempSrc:  Oral    SpO2:      Weight:      Height:       No intake/output data recorded.  FHT:  FHR: 125 bpm, variability: moderate,  accelerations:  Present,  decelerations:  Present occasional variables UC:   irregular, q1-2  SVE:   Dilation: 3 Effacement (%): 70 Station: -2 Exam by:: Samara Deist, CNM Pitocin @ 14  mu/min  Labs: Lab Results  Component Value Date   WBC 7.3 12/22/2019   HGB 10.9 (L) 12/22/2019   HCT 32.2 (L) 12/22/2019   MCV 87.0 12/22/2019   PLT 176 12/22/2019    Assessment / Plan: early labor; will try AROM in 2-3 hours  Labor: early labor Fetal Wellbeing:  Category I Pain Control:  Epidural Anticipated MOD:  NSVD  [name] Quintel Mccalla 12/22/2019, 9:47 PM

## 2019-12-22 NOTE — Anesthesia Preprocedure Evaluation (Addendum)
Anesthesia Evaluation  Patient identified by MRN, date of birth, ID band Patient awake    Reviewed: Allergy & Precautions, NPO status , Patient's Chart, lab work & pertinent test results  Airway Mallampati: II  TM Distance: >3 FB Neck ROM: Full    Dental  (+) Teeth Intact, Dental Advisory Given, Missing,    Pulmonary asthma ,  COVID-19 +   Pulmonary exam normal breath sounds clear to auscultation       Cardiovascular hypertension, Normal cardiovascular exam Rhythm:Regular Rate:Normal     Neuro/Psych negative neurological ROS     GI/Hepatic negative GI ROS, (+)     substance abuse  marijuana use,   Endo/Other  diabetes, Gestational  Renal/GU negative Renal ROS     Musculoskeletal negative musculoskeletal ROS (+)   Abdominal   Peds  Hematology  (+) Blood dyscrasia (Plt 176k), anemia ,   Anesthesia Other Findings Day of surgery medications reviewed with the patient.  Reproductive/Obstetrics (+) Pregnancy                             Anesthesia Physical Anesthesia Plan  ASA: III  Anesthesia Plan: Epidural   Post-op Pain Management:    Induction:   PONV Risk Score and Plan: 2 and Treatment may vary due to age or medical condition  Airway Management Planned: Natural Airway  Additional Equipment:   Intra-op Plan:   Post-operative Plan:   Informed Consent: I have reviewed the patients History and Physical, chart, labs and discussed the procedure including the risks, benefits and alternatives for the proposed anesthesia with the patient or authorized representative who has indicated his/her understanding and acceptance.     Dental advisory given  Plan Discussed with:   Anesthesia Plan Comments: (Patient identified. Risks/Benefits/Options discussed with patient including but not limited to bleeding, infection, nerve damage, paralysis, failed block, incomplete pain control,  headache, blood pressure changes, nausea, vomiting, reactions to medication both or allergic, itching and postpartum back pain. Confirmed with bedside nurse the patient's most recent platelet count. Confirmed with patient that they are not currently taking any anticoagulation, have any bleeding history or any family history of bleeding disorders. Patient expressed understanding and wished to proceed. All questions were answered. )        Anesthesia Quick Evaluation

## 2019-12-23 ENCOUNTER — Encounter (HOSPITAL_COMMUNITY): Payer: Self-pay | Admitting: Obstetrics & Gynecology

## 2019-12-23 DIAGNOSIS — O368130 Decreased fetal movements, third trimester, not applicable or unspecified: Secondary | ICD-10-CM

## 2019-12-23 DIAGNOSIS — O365930 Maternal care for other known or suspected poor fetal growth, third trimester, not applicable or unspecified: Secondary | ICD-10-CM

## 2019-12-23 DIAGNOSIS — Z30430 Encounter for insertion of intrauterine contraceptive device: Secondary | ICD-10-CM

## 2019-12-23 DIAGNOSIS — O34219 Maternal care for unspecified type scar from previous cesarean delivery: Secondary | ICD-10-CM

## 2019-12-23 DIAGNOSIS — Z3A37 37 weeks gestation of pregnancy: Secondary | ICD-10-CM

## 2019-12-23 LAB — CULTURE, BETA STREP (GROUP B ONLY): Strep Gp B Culture: NEGATIVE

## 2019-12-23 MED ORDER — BENZOCAINE-MENTHOL 20-0.5 % EX AERO
1.0000 "application " | INHALATION_SPRAY | CUTANEOUS | Status: DC | PRN
  Administered 2019-12-23: 1 via TOPICAL
  Filled 2019-12-23: qty 56

## 2019-12-23 MED ORDER — COCONUT OIL OIL: 1.0000 "application " | TOPICAL_OIL | Status: DC | PRN

## 2019-12-23 MED ORDER — SIMETHICONE 80 MG PO CHEW: 80.0000 mg | CHEWABLE_TABLET | ORAL | Status: DC | PRN

## 2019-12-23 MED ORDER — ONDANSETRON HCL 4 MG PO TABS: 4.0000 mg | ORAL_TABLET | ORAL | Status: DC | PRN

## 2019-12-23 MED ORDER — MAGNESIUM HYDROXIDE 400 MG/5ML PO SUSP: 30.0000 mL | ORAL | Status: DC | PRN

## 2019-12-23 MED ORDER — ONDANSETRON HCL 4 MG/2ML IJ SOLN: 4.0000 mg | INTRAMUSCULAR | Status: DC | PRN

## 2019-12-23 MED ORDER — MEASLES, MUMPS & RUBELLA VAC IJ SOLR: 0.5000 mL | Freq: Once | INTRAMUSCULAR | Status: DC

## 2019-12-23 MED ORDER — ACETAMINOPHEN 325 MG PO TABS
650.0000 mg | ORAL_TABLET | ORAL | Status: DC | PRN
  Administered 2019-12-23 – 2019-12-24 (×2): 650 mg via ORAL
  Filled 2019-12-23 (×3): qty 2

## 2019-12-23 MED ORDER — FERROUS SULFATE 325 (65 FE) MG PO TABS
325.0000 mg | ORAL_TABLET | Freq: Two times a day (BID) | ORAL | Status: DC
  Administered 2019-12-23 – 2019-12-24 (×2): 325 mg via ORAL
  Filled 2019-12-23 (×3): qty 1

## 2019-12-23 MED ORDER — IBUPROFEN 600 MG PO TABS
600.0000 mg | ORAL_TABLET | Freq: Four times a day (QID) | ORAL | Status: DC
  Administered 2019-12-23 – 2019-12-24 (×6): 600 mg via ORAL
  Filled 2019-12-23 (×6): qty 1

## 2019-12-23 MED ORDER — IBUPROFEN 600 MG PO TABS: 600.0000 mg | ORAL_TABLET | Freq: Four times a day (QID) | ORAL | Status: DC | PRN

## 2019-12-23 MED ORDER — DIPHENHYDRAMINE HCL 25 MG PO CAPS: 25.0000 mg | ORAL_CAPSULE | Freq: Four times a day (QID) | ORAL | Status: DC | PRN

## 2019-12-23 MED ORDER — DIBUCAINE (PERIANAL) 1 % EX OINT: 1.0000 "application " | TOPICAL_OINTMENT | CUTANEOUS | Status: DC | PRN

## 2019-12-23 MED ORDER — PRENATAL MULTIVITAMIN CH
1.0000 | ORAL_TABLET | Freq: Every day | ORAL | Status: DC
  Administered 2019-12-23 – 2019-12-24 (×2): 1 via ORAL
  Filled 2019-12-23 (×2): qty 1

## 2019-12-23 MED ORDER — WITCH HAZEL-GLYCERIN EX PADS: 1.0000 "application " | MEDICATED_PAD | CUTANEOUS | Status: DC | PRN

## 2019-12-23 MED ORDER — TETANUS-DIPHTH-ACELL PERTUSSIS 5-2.5-18.5 LF-MCG/0.5 IM SUSP: 0.5000 mL | Freq: Once | INTRAMUSCULAR | Status: DC

## 2019-12-23 NOTE — Discharge Instructions (Signed)

## 2019-12-23 NOTE — Anesthesia Postprocedure Evaluation (Signed)
Anesthesia Post Note  Patient: Julie Coleman  Procedure(s) Performed: AN AD HOC LABOR EPIDURAL     Patient location during evaluation: Mother Baby Anesthesia Type: Epidural Level of consciousness: awake and alert Pain management: pain level controlled Vital Signs Assessment: post-procedure vital signs reviewed and stable Respiratory status: spontaneous breathing, nonlabored ventilation and respiratory function stable Cardiovascular status: stable Postop Assessment: no headache, no backache and epidural receding Anesthetic complications: no    Last Vitals:  Vitals:   12/23/19 0930 12/23/19 1000  BP: 129/85 (!) 145/94  Pulse: 88 88  Resp: 18 18  Temp:    SpO2:      Last Pain:  Vitals:   12/23/19 0913  TempSrc: Oral  PainSc:    Pain Goal:                   Joelys Staubs

## 2019-12-23 NOTE — Discharge Summary (Signed)
Postpartum Discharge Summary     Patient Name: Julie Coleman DOB: December 29, 1990 MRN: [id number]  Date of admission: 12/22/2019 Delivering Provider: Darlina Rumpf   Date of discharge: 12/24/2019  Admitting diagnosis: Decreased fetal movement [O36.8190] Intrauterine pregnancy: [redacted]w[redacted]d    Secondary diagnosis:  Active Problems:   Trichomonal vaginitis during pregnancy   Anemia affecting pregnancy   Cannabis hyperemesis syndrome concurrent with and due to [place]   IUGR (intrauterine growth restriction) affecting care of mother   History of cesarean delivery   COVID-19 affecting pregnancy in third trimester   VBAC, delivered   Gestational hypertension  Additional problems: None     Discharge diagnosis: Term Pregnancy Delivered                                                                                                Post partum procedures:None  Augmentation: AROM, Pitocin and Foley Balloon  Complications: None  Hospital course:  Induction of Labor With Vaginal Delivery   29y.o. yo [id number] [id number] admitted to the hospital 12/22/2019 for induction of labor.  Indication for induction: FGR, DFM.  Patient had an uncomplicated labor course as follows: Membrane Rupture Time/Date: 6:03 AM ,12/23/2019   Intrapartum Procedures: Episiotomy: None [1]                                         Lacerations:  None [1]  Patient had delivery of a Viable infant.  Information for the patient's newborn:  SmMykell, Genao0[[id number]]Delivery Method: VBAC, Spontaneous(Filed from Delivery Summary)    12/23/2019  Details of delivery can be found in separate delivery note.  Patient had a routine postpartum course. CPS saw patient and cleared for discharge. Infant had to stay until PPD#2 but patient was threatening to leave AMA on PPD#1. BP's reviewed and patient with gHTN. Amlodipine 2.5 mg initiated and patient discharged on this. PP BP check requested. IUD placed after delivery.  Patient is discharged home 12/24/19. Delivery time: 10:35 AM    Magnesium Sulfate received: No BMZ received: No Rhophylac:No MMR:No Transfusion:No  Physical exam  Vitals:   12/23/19 2351 12/24/19 0610 12/24/19 1720 12/24/19 1743  BP: 132/87 129/90 (!) 132/107 140/85  Pulse: 65 63 (!) 57 (!) 55  Resp: _0 Temp: 98.3 F (36.8 C) 97.7 F (36.5 C) 98.5 F (36.9 C)   TempSrc:  Oral Oral   SpO2: 100% 100% 100%   Weight:      Height:       General: alert, cooperative and no distress Lochia: appropriate Uterine Fundus: firm DVT Evaluation: No evidence of DVT seen on physical exam. Labs: Lab Results  Component Value Date   WBC 7.3 12/22/2019   HGB 10.9 (L) 12/22/2019   HCT 32.2 (L) 12/22/2019   MCV 87.0 12/22/2019   PLT 176 12/22/2019   CMP Latest Ref Rng & Units 12/22/2019  Glucose 70 - 99 mg/dL 83  BUN 6 - 20  mg/dL 6  Creatinine 0.44 - 1.00 mg/dL 0.76  Sodium 135 - 145 mmol/L 138  Potassium 3.5 - 5.1 mmol/L 3.0(L)  Chloride 98 - 111 mmol/L 102  CO2 22 - 32 mmol/L 27  Calcium 8.9 - 10.3 mg/dL 8.8(L)  Total Protein 6.5 - 8.1 g/dL 6.2(L)  Total Bilirubin 0.3 - 1.2 mg/dL 0.7  Alkaline Phos 38 - 126 U/L 65  AST 15 - 41 U/L 18  ALT 0 - 44 U/L 18    Discharge instruction: per After Visit Summary and "Baby and Me Booklet".  After visit meds:  Allergies as of 12/24/2019      Reactions   Phenergan [promethazine Hcl]    Tardive dyskinesia       Medication List    TAKE these medications   acetaminophen 325 MG tablet Commonly known as: Tylenol Take 2 tablets (650 mg total) by mouth every 6 (six) hours as needed (for pain scale < 4).   amLODipine 2.5 MG tablet Commonly known as: NORVASC Take 1 tablet (2.5 mg total) by mouth daily.   diphenhydrAMINE 25 mg capsule Commonly known as: BENADRYL Take 1 capsule (25 mg total) by mouth every 6 (six) hours as needed (nausea).   docusate sodium 100 MG capsule Commonly known as: COLACE Take 1 capsule (100 mg total)  by mouth 2 (two) times daily.   ferrous sulfate 325 (65 FE) MG tablet Take 1 tablet (325 mg total) by mouth daily.   glycopyrrolate 1 MG tablet Commonly known as: ROBINUL Take 1 tablet (1 mg total) by mouth 3 (three) times daily.   ibuprofen 600 MG tablet Commonly known as: ADVIL Take 1 tablet (600 mg total) by mouth every 6 (six) hours. Start taking on: December 25, 2019   ondansetron 4 MG disintegrating tablet Commonly known as: Zofran ODT Take 1 tablet (4 mg total) by mouth every 8 (eight) hours as needed for nausea or vomiting.   pantoprazole 40 MG tablet Commonly known as: Protonix Take 1 tablet (40 mg total) by mouth 2 (two) times daily.   polyethylene glycol 17 g packet Commonly known as: MIRALAX / GLYCOLAX Take 17 g by mouth daily.   PrePLUS 27-1 MG Tabs Take 1 tablet by mouth daily.   scopolamine 1 MG/3DAYS Commonly known as: TRANSDERM-SCOP Place 1 patch (1.5 mg total) onto the skin every 3 (three) days.   sodium phosphate 7-19 GM/118ML Enem Place 133 mLs (1 enema total) rectally daily as needed for severe constipation.       Diet: routine diet  Activity: Advance as tolerated. Pelvic rest for 6 weeks.   Outpatient follow up:4 weeks Follow up Appt:No future appointments. Follow up Visit:   Please schedule this patient for Postpartum visit in: 4 weeks with the following provider: Any provider For C/S patients schedule nurse incision check in weeks 2 weeks: no Low risk pregnancy complicated by: FGR Delivery mode:  SVD Anticipated Birth Control:  IUD PP Procedures needed: N/A  Schedule Integrated [place] visit: no      Newborn Data: Live born female  Birth Weight: 5 lb 7.5 oz (2481 g) APGAR: 7, 9  Newborn Delivery   Birth date/time: 12/23/2019 10:35:00 Delivery type: VBAC, Spontaneous      Baby Feeding: Bottle Disposition:rooming in

## 2019-12-23 NOTE — Progress Notes (Signed)
CSW met with [name], RN in 5th floor nursery to discuss this MOB and infant. RN reports concerns for infant as MOB has significant history of MAU (14) visits during pregnancy. In addition, MOB received care in [place] during pregnancy also. MOB reported to nursing staff that her other children reside in [place] with their father.  MOB's UDS is positive for marijuana at time of delivery, and has been consistently positive since the beginning of November 2020. MOB is also COVID positive. MOB presented to [facility] alone, without a support person.  CSW made CPS report to Wes Early, [place] CPS. CPS report was accepted and will be assigned to a case worker tomorrow.   Please wait for CPS engagement prior to discharge. Call CPS if more information is obtained that needs to be reported.  Derrick Orris, MSW, LCSW-A Transitions of Care  Clinical Social Worker  [facility]  Medical ICU [phone]  

## 2019-12-23 NOTE — Lactation Note (Addendum)
This note was copied from a baby's chart. Lactation Consultation Note  Patient Name: Boy Huxley Vanwagoner ZLDJT'T Date: 12/23/2019   [facility] spoke to RN taking care of Mom and baby.  RN stated Mom wants to exclusively formula feed baby.  Asked RN to inquire what Mom's feeding choice is currently as LC noted in Mother's chart that she wanted to breastfeed on admission. RN to call lactation if she would like assistance.  [place], [place] 12/23/2019, 3:57 PM

## 2019-12-23 NOTE — Progress Notes (Addendum)
Julie Coleman is a 28 y.o. [phone] at [redacted]w[redacted]d admitted for induction of labor due to IUGR (now resolved) and decreased  fetal movements. TOLAC  Subjective: Patient appears well. Per RN, patient doing well, feeling some contractions   Objective: BP 127/87   Pulse 66   Temp 98.3 F (36.8 C) (Oral)   Resp 16   Ht 5\' 7"  (1.702 m)   Wt 78.4 kg   LMP 04/07/2019   SpO2 96%   BMI 27.06 kg/m  No intake/output data recorded. Total I/O In: -  Out: 1600 [Urine:1600]  FHT:  FHR: 130 bpm, variability: moderate,  accelerations:  Present,  decelerations:  Absent UC:   regular, every 2 minutes SVE:   Dilation: 5 Effacement (%): 70 Station: -2 Exam by:: [email or web address], RN  Labs: Lab Results  Component Value Date   WBC 7.3 12/22/2019   HGB 10.9 (L) 12/22/2019   HCT 32.2 (L) 12/22/2019   MCV 87.0 12/22/2019   PLT 176 12/22/2019   Assessment / Plan: Induction of labor due to non-reassuring fetal testing and IUGR,  progressing well on pitocin  Labor: Progressing on Pitocin, will continue to increase then AROM (likely will not AROM until the morning- as patient is COVID positive, want to avoid need for cesarean).  Fetal Wellbeing:  Category I Pain Control:  Epidural I/D:  COVID + Anticipated MOD:  NSVD  02/19/2020 12/23/2019, 3:34 AM  I confirm that I have verified the information documented in the resident's note and that I have also personally reperformed the history, physical exam and all medical decision making activities of this service and have verified that all service and findings are accurately documented in this student's note.     02/20/2020, CNM 12/23/2019 5:31 AM

## 2019-12-23 NOTE — Progress Notes (Signed)
   Julie Coleman is a 29 y.o. [phone] at [redacted]w[redacted]d  admitted for decreased fetal movements. Past medical history includes diagnosis of IUGR on 12/1 but growth was appropriate on 12/22.   Subjective:  Resting comfortably with epidural.  Objective: Vitals:   12/23/19 0500 12/23/19 0530 12/23/19 0600 12/23/19 0630  BP: 131/89 124/89 129/86 119/82  Pulse: 72 69 71 65  Resp:  16    Temp:      TempSrc:      SpO2:    99%  Weight:      Height:       No intake/output data recorded.  FHT:  FHR: 125 bpm, variability: moderate,  accelerations:  Present,  decelerations:  Absent UC:   Regular q 2 minutes SVE:   Dilation: 5 Effacement (%): 90 Station: 0 Exam by:: Loletha Carrow, RN Pitocin @ 14 mu/min  Labs: Lab Results  Component Value Date   WBC 7.3 12/22/2019   HGB 10.9 (L) 12/22/2019   HCT 32.2 (L) 12/22/2019   MCV 87.0 12/22/2019   PLT 176 12/22/2019    Assessment / Plan: still 5 cm, now with bulging bag of water; AROM clear fluid.   Labor: active labor with adequate contraction pattern Fetal Wellbeing:  Category I Pain Control:  Epidural Anticipated MOD:  NSVD  Julie Coleman Julie Coleman 12/23/2019, 7:04 AM

## 2019-12-23 NOTE — Procedures (Signed)
Post-Placental IUD Insertion Procedure Note  Patient identified, informed consent signed prior to delivery, signed copy in chart, time out was performed.    Vaginal, labial and perineal areas thoroughly inspected for lacerations. No lacerations noted. Liletta  - IUD inserted with inserter per manufacturer's instructions.    Strings trimmed to the level of the introitus. Patient tolerated procedure well.  Patient given post procedure instructions and IUD care card with expiration date.  Patient is asked to keep IUD strings tucked in her vagina until her postpartum follow up visit in 4-6 weeks. Patient advised to abstain from sexual intercourse and pulling on strings before her follow-up visit. Patient verbalized an understanding of the plan of care and agrees.   Clayton Bibles, MSN, CNM Certified Nurse Midwife, [facility] for [employer], [facility] 12/23/19 12:48 PM

## 2019-12-23 NOTE — Progress Notes (Deleted)
Post-Placental IUD Insertion Procedure Note  Patient identified, informed consent signed prior to delivery, signed copy in chart, time out was performed.    Vaginal, labial and perineal areas thoroughly inspected for lacerations. No lacerations noted  - IUD inserted with inserter per manufacturer's instructions.    Strings trimmed to the level of the introitus. Patient tolerated procedure well.   Patient given post procedure instructions and IUD care card with expiration date.  Patient is asked to keep IUD strings tucked in her vagina until her postpartum follow up visit in 4-6 weeks. Patient advised to abstain from sexual intercourse and pulling on strings before her follow-up visit. Patient verbalized an understanding of the plan of care and agrees.

## 2019-12-24 DIAGNOSIS — O139 Gestational [pregnancy-induced] hypertension without significant proteinuria, unspecified trimester: Secondary | ICD-10-CM

## 2019-12-24 MED ORDER — OXYCODONE HCL 5 MG PO TABS
5.0000 mg | ORAL_TABLET | Freq: Four times a day (QID) | ORAL | Status: DC | PRN
  Administered 2019-12-24: 5 mg via ORAL
  Filled 2019-12-24: qty 1

## 2019-12-24 MED ORDER — AMLODIPINE BESYLATE 2.5 MG PO TABS: 2.5000 mg | ORAL_TABLET | Freq: Every day | ORAL | 0 refills | Status: DC

## 2019-12-24 MED ORDER — IBUPROFEN 600 MG PO TABS: 600.0000 mg | ORAL_TABLET | Freq: Four times a day (QID) | ORAL | 0 refills | Status: DC

## 2019-12-24 MED ORDER — AMLODIPINE BESYLATE 5 MG PO TABS
2.5000 mg | ORAL_TABLET | Freq: Every day | ORAL | Status: DC
  Administered 2019-12-24: 18:00:00 2.5 mg via ORAL
  Filled 2019-12-24: qty 1

## 2019-12-24 MED ORDER — ACETAMINOPHEN 325 MG PO TABS: 650.0000 mg | ORAL_TABLET | Freq: Four times a day (QID) | ORAL | Status: DC | PRN

## 2019-12-24 NOTE — Progress Notes (Signed)
CSW spoke to [facility] following assessment with MOB. Per Rosey Bath, no barriers to infant discharging to MOB once medically ready. CPS to continue to follow in the community.   Lear Ng, LCSW [facility] [phone]
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
## 2019-12-24 NOTE — Progress Notes (Addendum)
Patient threatening to leave AMA. Patients states she is done with the hospital and wants to go home. She is fine with baby staying in isolation nursery overnight. MD notified and is willing to DC patient if started on BP medicine. Reviewed with patient and is understanding. Discharge education reviewed and first dose of norvasc given
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
## 2019-12-24 NOTE — Progress Notes (Signed)
Elevated BP and has headache. Patient upset and agitated. Motrin given for headache. Would not give number for pain

## 2019-12-25 LAB — RPR: RPR Ser Ql: NONREACTIVE

## 2019-12-25 LAB — SURGICAL PATHOLOGY

## 2020-01-08 ENCOUNTER — Ambulatory Visit: Payer: Medicaid Other

## 2020-01-09 ENCOUNTER — Ambulatory Visit: Payer: Medicaid Other

## 2020-01-30 ENCOUNTER — Ambulatory Visit: Payer: Medicaid Other | Admitting: Obstetrics and Gynecology

## 2020-01-30 ENCOUNTER — Encounter: Payer: Self-pay | Admitting: Family Medicine

## 2020-01-30 ENCOUNTER — Encounter: Payer: Self-pay | Admitting: Obstetrics and Gynecology

## 2020-01-31 NOTE — Progress Notes (Signed)
Patient did not keep her postpartum appointment for 01/30/2020.  Cornelia Copa MD Attending Center for [employer] Midwife)

## 2020-02-04 ENCOUNTER — Encounter: Payer: Self-pay | Admitting: General Practice

## 2020-12-21 ENCOUNTER — Encounter (HOSPITAL_COMMUNITY): Payer: Self-pay | Admitting: Emergency Medicine

## 2020-12-21 ENCOUNTER — Emergency Department (HOSPITAL_COMMUNITY)
Admission: EM | Admit: 2020-12-21 | Discharge: 2020-12-22 | Disposition: A | Discharge status: Home / Self Care | Payer: Medicaid Other | Attending: Emergency Medicine | Admitting: Emergency Medicine

## 2020-12-21 ENCOUNTER — Other Ambulatory Visit: Payer: Self-pay

## 2020-12-21 DIAGNOSIS — R0981 Nasal congestion: Secondary | ICD-10-CM | POA: Diagnosis not present

## 2020-12-21 DIAGNOSIS — Z8616 Personal history of COVID-19: Secondary | ICD-10-CM | POA: Insufficient documentation

## 2020-12-21 DIAGNOSIS — R059 Cough, unspecified: Secondary | ICD-10-CM | POA: Diagnosis not present

## 2020-12-21 DIAGNOSIS — Z20822 Contact with and (suspected) exposure to covid-19: Secondary | ICD-10-CM | POA: Diagnosis not present

## 2020-12-21 DIAGNOSIS — R112 Nausea with vomiting, unspecified: Secondary | ICD-10-CM | POA: Insufficient documentation

## 2020-12-21 DIAGNOSIS — J45909 Unspecified asthma, uncomplicated: Secondary | ICD-10-CM | POA: Diagnosis not present

## 2020-12-21 DIAGNOSIS — Z79899 Other long term (current) drug therapy: Secondary | ICD-10-CM | POA: Insufficient documentation

## 2020-12-21 DIAGNOSIS — R102 Pelvic and perineal pain: Secondary | ICD-10-CM

## 2020-12-21 DIAGNOSIS — R109 Unspecified abdominal pain: Secondary | ICD-10-CM | POA: Diagnosis not present

## 2020-12-21 DIAGNOSIS — R748 Abnormal levels of other serum enzymes: Secondary | ICD-10-CM

## 2020-12-21 LAB — COMPREHENSIVE METABOLIC PANEL
ALT: 28 U/L (ref 0–44)
AST: 29 U/L (ref 15–41)
Albumin: 5 g/dL (ref 3.5–5.0)
Alkaline Phosphatase: 54 U/L (ref 38–126)
Anion gap: 16 — ABNORMAL HIGH (ref 5–15)
BUN: 18 mg/dL (ref 6–20)
CO2: 25 mmol/L (ref 22–32)
Calcium: 10.4 mg/dL — ABNORMAL HIGH (ref 8.9–10.3)
Chloride: 99 mmol/L (ref 98–111)
Creatinine, Ser: 1.81 mg/dL — ABNORMAL HIGH (ref 0.44–1.00)
GFR, Estimated: 38 mL/min — ABNORMAL LOW (ref >60)
Glucose, Bld: 113 mg/dL — ABNORMAL HIGH (ref 70–99)
Potassium: 3.4 mmol/L — ABNORMAL LOW (ref 3.5–5.1)
Sodium: 140 mmol/L (ref 135–145)
Total Bilirubin: 1.1 mg/dL (ref 0.3–1.2)
Total Protein: 8.7 g/dL — ABNORMAL HIGH (ref 6.5–8.1)

## 2020-12-21 LAB — CBC
HCT: 42.2 % (ref 36.0–46.0)
Hemoglobin: 14.4 g/dL (ref 12.0–15.0)
MCH: 29.4 pg (ref 26.0–34.0)
MCHC: 34.1 g/dL (ref 30.0–36.0)
MCV: 86.1 fL (ref 80.0–100.0)
Platelets: 232 10*3/uL (ref 150–400)
RBC: 4.9 MIL/uL (ref 3.87–5.11)
RDW: 13.5 % (ref 11.5–15.5)
WBC: 16.4 10*3/uL — ABNORMAL HIGH (ref 4.0–10.5)
nRBC: 0 % (ref 0.0–0.2)

## 2020-12-21 LAB — I-STAT BETA HCG BLOOD, ED (MC, WL, AP ONLY): I-stat hCG, quantitative: <5 m[IU]/mL (ref <5)

## 2020-12-21 LAB — LIPASE, BLOOD: Lipase: 23 U/L (ref 11–51)

## 2020-12-21 MED ORDER — ONDANSETRON 4 MG PO TBDP
4.0000 mg | ORAL_TABLET | Freq: Once | ORAL | Status: AC
  Administered 2020-12-21: 4 mg via ORAL
  Filled 2020-12-21: qty 1

## 2020-12-21 MED ORDER — ONDANSETRON 4 MG PO TBDP
4.0000 mg | ORAL_TABLET | Freq: Once | ORAL | Status: AC | PRN
  Administered 2020-12-21: 4 mg via ORAL
  Filled 2020-12-21: qty 1

## 2020-12-21 NOTE — ED Triage Notes (Addendum)
Pt to triage via [facility] from home.  Reports near syncope, R sided abd pain, nausea, vomiting, and diarrhea since yesterday.  20g IV L AC. Zofran 4mg  IV given PTA.  Pt requesting something to drink and more Zofran on arrival.  Informed her that she cannot have anything to drink due to nausea and vomiting and that EMS just gave her Zofran.

## 2020-12-21 NOTE — ED Notes (Signed)
A patient told this NT that there was a patient lying on the floor. This writer went over to patient to ask what happened. She states "I cant feel my body. I dont want to move." asked patient if I could help her get off the floor so we can check her vitals. She states she doesn't want to get off the floor. RN Orson Aloe notified.

## 2020-12-22 ENCOUNTER — Emergency Department (HOSPITAL_COMMUNITY): Payer: Medicaid Other

## 2020-12-22 LAB — I-STAT CHEM 8, ED
BUN: 29 mg/dL — ABNORMAL HIGH (ref 6–20)
Calcium, Ion: 1.03 mmol/L — ABNORMAL LOW (ref 1.15–1.40)
Chloride: 105 mmol/L (ref 98–111)
Creatinine, Ser: 1.6 mg/dL — ABNORMAL HIGH (ref 0.44–1.00)
Glucose, Bld: 82 mg/dL (ref 70–99)
HCT: 35 % — ABNORMAL LOW (ref 36.0–46.0)
Hemoglobin: 11.9 g/dL — ABNORMAL LOW (ref 12.0–15.0)
Potassium: 3.2 mmol/L — ABNORMAL LOW (ref 3.5–5.1)
Sodium: 141 mmol/L (ref 135–145)
TCO2: 25 mmol/L (ref 22–32)

## 2020-12-22 LAB — URINALYSIS, ROUTINE W REFLEX MICROSCOPIC
Bilirubin Urine: NEGATIVE
Glucose, UA: NEGATIVE mg/dL
Hgb urine dipstick: NEGATIVE
Ketones, ur: 20 mg/dL — AB
Nitrite: NEGATIVE
Protein, ur: NEGATIVE mg/dL
Specific Gravity, Urine: 1.023 (ref 1.005–1.030)
pH: 6 (ref 5.0–8.0)

## 2020-12-22 LAB — RESP PANEL BY RT-PCR (FLU A&B, COVID) ARPGX2
Influenza A by PCR: NEGATIVE
Influenza B by PCR: NEGATIVE
SARS Coronavirus 2 by RT PCR: NEGATIVE

## 2020-12-22 MED ORDER — ONDANSETRON 4 MG PO TBDP: ORAL_TABLET | ORAL | 0 refills | Status: DC

## 2020-12-22 MED ORDER — ACETAMINOPHEN 325 MG PO TABS
650.0000 mg | ORAL_TABLET | Freq: Once | ORAL | Status: AC
  Administered 2020-12-22: 650 mg via ORAL
  Filled 2020-12-22: qty 2

## 2020-12-22 MED ORDER — POTASSIUM CHLORIDE CRYS ER 20 MEQ PO TBCR
40.0000 meq | EXTENDED_RELEASE_TABLET | Freq: Once | ORAL | Status: AC
  Administered 2020-12-22: 40 meq via ORAL
  Filled 2020-12-22: qty 2

## 2020-12-22 MED ORDER — ONDANSETRON 4 MG PO TBDP
8.0000 mg | ORAL_TABLET | Freq: Once | ORAL | Status: AC
  Administered 2020-12-22: 8 mg via ORAL
  Filled 2020-12-22: qty 2

## 2020-12-22 MED ORDER — SODIUM CHLORIDE 0.9 % IV BOLUS
1000.0000 mL | Freq: Once | INTRAVENOUS | Status: AC
  Administered 2020-12-22: 1000 mL via INTRAVENOUS

## 2020-12-22 MED ORDER — ACETAMINOPHEN 500 MG PO TABS
1000.0000 mg | ORAL_TABLET | Freq: Once | ORAL | Status: AC
  Administered 2020-12-22: 1000 mg via ORAL
  Filled 2020-12-22: qty 2

## 2020-12-22 MED ORDER — LORAZEPAM 2 MG/ML IJ SOLN
1.0000 mg | Freq: Once | INTRAMUSCULAR | Status: AC
  Administered 2020-12-22: 1 mg via INTRAVENOUS
  Filled 2020-12-22: qty 1

## 2020-12-22 NOTE — Discharge Instructions (Addendum)
Maintain a bland diet for the next few days.  Return here as needed if you have any worsening symptoms. Ensure you hydrate well. Provided is a prescription for nausea medicine.  Please return to the ER if your symptoms worsen; you have increased pain, fevers, chills, inability to keep any medications down, confusion. Otherwise see the outpatient doctor as requested.

## 2020-12-22 NOTE — ED Notes (Signed)
Patient transported to CT 

## 2020-12-22 NOTE — ED Notes (Signed)
Pt transported to CT ?

## 2020-12-22 NOTE — ED Notes (Signed)
Pt refusing to leave and would like to speak to provider about d/c.

## 2020-12-22 NOTE — ED Notes (Signed)
Patient seen throwing up by [place], [place]. Patient the came up and asked this NT to give her more ginger ale.

## 2020-12-22 NOTE — ED Notes (Signed)
ED Provider at bedside. 

## 2020-12-22 NOTE — ED Provider Notes (Signed)
[facility] EMERGENCY DEPARTMENT Provider Note   CSN: [id number] Arrival date & time: 12/21/20  1307     History Chief Complaint  Patient presents with  . Abdominal Pain    Julie Coleman is a 30 y.o. female.  Patient is a 30 year old female who presents with nausea and vomiting.  She has a history of hypertension and gestational diabetes.  She per her chart review has had a prior history of cannabis hyperemesis syndrome.  She says that she has not had any issues with vomiting for about a year when she was pregnant.  She reports the last 3 days she has not been able to keep anything down.  She reports diffuse abdominal discomfort.  No known fevers.  She does have a little bit of a cough and some runny nose and congestion.  She says she has not urinated in several hours.  She denies any diarrhea or change in stools.  Her emesis is nonbloody and nonbilious.  She is mostly dry heaving at this point.  She feels diffusely weak and had a near syncopal episode at home.  She says she feels numb all over.  She was given Zofran IV by EMS and has received 2 ODT Zofran's prior to my evaluation.        Past Medical History:  Diagnosis Date  . Asthma    albuteral inhaler  . Decreased fetal movement 12/22/2019  . Gestational diabetes   . Hypertension     Patient Active Problem List   Diagnosis Date Noted  . Gestational hypertension 12/24/2019  . VBAC, delivered 12/23/2019  . COVID-19 affecting pregnancy in third trimester 12/21/2019  . History of cesarean delivery 12/19/2019  . History of decreased fetal movements 12/19/2019  . Supervision of high risk pregnancy, antepartum 11/21/2019  . Hyperemesis 11/20/2019  . IUGR (intrauterine growth restriction) affecting care of mother 11/20/2019  . Hyperemesis affecting pregnancy, antepartum 11/13/2019  . Rubella non-immune status, antepartum 11/12/2019  . Cannabis hyperemesis syndrome concurrent with and due to cannabis abuse (HCC)  11/09/2019  . Nausea and vomiting in pregnancy 11/08/2019  . Heartburn during pregnancy, antepartum 10/25/2019  . Trichomonal vaginitis during pregnancy 10/22/2019  . Anemia affecting pregnancy 10/22/2019    Past Surgical History:  Procedure Laterality Date  . CESAREAN SECTION    . CHOLECYSTECTOMY       OB History    Gravida  6   Para  4   Term  3   Preterm  1   AB  2   Living  4     SAB  1   IAB  1   Ectopic      Multiple  0   Live Births  4           No family history on file.  Social History   Tobacco Use  . Smoking status: Never Smoker  . Smokeless tobacco: Never Used  Vaping Use  . Vaping Use: Never used  Substance Use Topics  . Alcohol use: Not Currently  . Drug use: Yes    Types: Marijuana    Comment: last use yesterday 12/21/2019    Home Medications Prior to Admission medications   Medication Sig Start Date End Date Taking? Authorizing Provider  ondansetron (ZOFRAN ODT) 4 MG disintegrating tablet 4mg  ODT q4 hours prn nausea/vomit 12/22/20  Yes 02/19/21, MD  acetaminophen (TYLENOL) 325 MG tablet Take 2 tablets (650 mg total) by mouth every 6 (six) hours as needed (for  pain scale < 4). 12/24/19   Fair, Hoyle Sauer, MD  amLODipine (NORVASC) 2.5 MG tablet Take 1 tablet (2.5 mg total) by mouth daily. 12/24/19   Fair, Hoyle Sauer, MD  diphenhydrAMINE (BENADRYL) 25 mg capsule Take 1 capsule (25 mg total) by mouth every 6 (six) hours as needed (nausea). Patient not taking: Reported on 12/04/2019 11/15/19   Conan Bowens, MD  docusate sodium (COLACE) 100 MG capsule Take 1 capsule (100 mg total) by mouth 2 (two) times daily. 11/17/19   Judeth Horn, NP  ferrous sulfate 325 (65 FE) MG tablet Take 1 tablet (325 mg total) by mouth daily. 12/10/19 12/09/20  Nugent, Odie Sera, NP  glycopyrrolate (ROBINUL) 1 MG tablet Take 1 tablet (1 mg total) by mouth 3 (three) times daily. 11/22/19   Donette Larry, CNM  ibuprofen (ADVIL) 600 MG tablet Take 1 tablet (600  mg total) by mouth every 6 (six) hours. 12/25/19   Fair, Hoyle Sauer, MD  pantoprazole (PROTONIX) 40 MG tablet Take 1 tablet (40 mg total) by mouth 2 (two) times daily. 11/22/19   Donette Larry, CNM  polyethylene glycol (MIRALAX / GLYCOLAX) 17 g packet Take 17 g by mouth daily. Patient not taking: Reported on 12/19/2019 11/21/19   Allie Bossier, MD  Prenatal Vit-Fe Fumarate-FA (PREPLUS) 27-1 MG TABS Take 1 tablet by mouth daily. 11/09/19   Anyanwu, Jethro Bastos, MD  scopolamine (TRANSDERM-SCOP) 1 MG/3DAYS Place 1 patch (1.5 mg total) onto the skin every 3 (three) days. 11/22/19   Donette Larry, CNM  sodium phosphate (FLEET) 7-19 GM/118ML ENEM Place 133 mLs (1 enema total) rectally daily as needed for severe constipation. 12/04/19   Reva Bores, MD    Allergies    Phenergan [promethazine hcl]  Review of Systems   Review of Systems  Constitutional: Positive for appetite change and fatigue. Negative for chills, diaphoresis and fever.  HENT: Positive for congestion and rhinorrhea. Negative for sneezing.   Eyes: Negative.   Respiratory: Positive for cough. Negative for chest tightness and shortness of breath.   Cardiovascular: Negative for chest pain and leg swelling.  Gastrointestinal: Positive for abdominal pain, nausea and vomiting. Negative for blood in stool and diarrhea.  Genitourinary: Positive for decreased urine volume. Negative for difficulty urinating, flank pain, frequency and hematuria.  Musculoskeletal: Negative for arthralgias and back pain.  Skin: Negative for rash.  Neurological: Positive for light-headedness. Negative for dizziness, speech difficulty, weakness, numbness and headaches.    Physical Exam Updated Vital Signs BP 114/65   Pulse (!) 48   Temp 98.5 F (36.9 C) (Oral)   Resp 16   LMP 12/14/2020   SpO2 100%   Physical Exam Constitutional:      Appearance: She is well-developed and well-nourished.  HENT:     Head: Normocephalic and atraumatic.      Mouth/Throat:     Mouth: Mucous membranes are dry.  Eyes:     Pupils: Pupils are equal, round, and reactive to light.  Cardiovascular:     Rate and Rhythm: Normal rate and regular rhythm.     Heart sounds: Normal heart sounds.  Pulmonary:     Effort: Pulmonary effort is normal. No respiratory distress.     Breath sounds: Normal breath sounds. No wheezing or rales.  Chest:     Chest wall: No tenderness.  Abdominal:     General: Bowel sounds are normal.     Palpations: Abdomen is soft.     Tenderness: There is generalized abdominal tenderness.  There is no guarding or rebound.  Musculoskeletal:        General: No edema. Normal range of motion.     Cervical back: Normal range of motion and neck supple.  Lymphadenopathy:     Cervical: No cervical adenopathy.  Skin:    General: Skin is warm and dry.     Findings: No rash.  Neurological:     Mental Status: She is alert and oriented to person, place, and time.  Psychiatric:        Mood and Affect: Mood and affect normal.     ED Results / Procedures / Treatments   Labs (all labs ordered are listed, but only abnormal results are displayed) Labs Reviewed  COMPREHENSIVE METABOLIC PANEL - Abnormal; Notable for the following components:      Result Value   Potassium 3.4 (*)    Glucose, Bld 113 (*)    Creatinine, Ser 1.81 (*)    Calcium 10.4 (*)    Total Protein 8.7 (*)    GFR, Estimated 38 (*)    Anion gap 16 (*)    All other components within normal limits  CBC - Abnormal; Notable for the following components:   WBC 16.4 (*)    All other components within normal limits  RESP PANEL BY RT-PCR (FLU A&B, COVID) ARPGX2  LIPASE, BLOOD  URINALYSIS, ROUTINE W REFLEX MICROSCOPIC  I-STAT BETA HCG BLOOD, ED ([facility], [facility], [facility] ONLY)  I-STAT CHEM 8, ED    EKG EKG Interpretation  Date/Time:  Sunday December 21 2020 13:49:11 EST Ventricular Rate:  55 PR Interval:  130 QRS Duration: 74 QT Interval:  454 QTC Calculation: 434 R  Axis:   83 Text Interpretation: Sinus bradycardia ST & T wave abnormality, consider lateral ischemia Abnormal ECG No old tracing to compare Confirmed by Rolan Bucco [phone]) on 12/22/2020 8:46:54 AM   Radiology CT Abdomen Pelvis Wo Contrast  Result Date: 12/22/2020 CLINICAL DATA:  RLQ abdominal pain, appendicitis suspected (Age >= 14y) EXAM: CT ABDOMEN AND PELVIS WITHOUT CONTRAST TECHNIQUE: Multidetector CT imaging of the abdomen and pelvis was performed following the standard protocol without IV contrast. COMPARISON:  None. FINDINGS: Lower chest: Lung bases are clear. Hepatobiliary: No focal hepatic lesion. No biliary dilatation. Gallbladder is surgically absent. Pancreas: No focal lesions or pancreatic ductal dilatation. No surrounding inflammation. Spleen: Unremarkable. Adrenals/Urinary Tract: Adrenal glands are unremarkable. No focal renal lesion. 3 mm right inter pole calculus. No hydronephrosis. Bladder is unremarkable. Stomach/Bowel: Stomach is within normal limits. Appendix measures 7 mm. Hyperdense foci within the appendix may reflect tiny appendicoliths versus hyperdense stool. No periappendiceal free fluid or stranding to suggest inflammation. No evidence of obstruction. No bowel wall thickening or inflammatory changes. No ascites. Vascular/Lymphatic: Vasculature is within normal limits for patient's age. No abdominopelvic adenopathy. Reproductive: T-shaped intrauterine contraceptive device may be inverted. Prominent fluid-filled tubular structure along the right adnexa may reflect mild hydrosalpinx. Other: External soft tissues are unremarkable. Musculoskeletal: No acute or significant osseous findings. IMPRESSION: No evidence of appendicitis. Nonobstructive right nephrolithiasis measuring 3 mm. Malpositioned IUD.  Query mild right hydrosalpinx. Dedicated pelvic ultrasound is recommended for further evaluation. Electronically Signed   By: Stana Bunting M.D.   On: 12/22/2020 12:25   US  Transvaginal Non-OB  Result Date: 12/22/2020 CLINICAL DATA:  Abdominal pain and vomiting, evaluate misplaced IUD EXAM: TRANSABDOMINAL AND TRANSVAGINAL ULTRASOUND OF PELVIS DOPPLER ULTRASOUND OF OVARIES TECHNIQUE: Both transabdominal and transvaginal ultrasound examinations of the pelvis were performed. Transabdominal technique was performed for  global imaging of the pelvis including uterus, ovaries, adnexal regions, and pelvic cul-de-sac. It was necessary to proceed with endovaginal exam following the transabdominal exam to visualize the ovaries. Color and duplex Doppler ultrasound was utilized to evaluate blood flow to the ovaries. COMPARISON:  CT from earlier in the same day. FINDINGS: Uterus Measurements: 9.9 x 4.4 x 6.3 cm. = volume: 143 mL. No fibroids or other mass visualized. Endometrium Thickness: 3 mm. IUD is noted in place although similar to that seen on prior CT examination. The IUD is inverted with the lateral limbs cupping the external cervical os. Right ovary Measurements: 2.3 x 1.2 x 2.0 cm = volume: 2.8 mL. Normal appearance/no adnexal mass. Pelvic varicosities are noted adjacent to the right adnexa. This corresponds to the hypodense structure seen on recent CT examination. No hydrosalpinx is noted. Left ovary Measurements: 2.1 x 1.1 x 1.4 cm. = volume: 1.7 mL. Normal appearance/no adnexal mass. Pulsed Doppler evaluation of both ovaries demonstrates normal low-resistance arterial and venous waveforms. Other findings No abnormal free fluid. IMPRESSION: Mild positioned IUD similar to that seen on prior CT examination. No evidence of right hydrosalpinx. Mild varicosities are noted in the right adnexal region. Normal appearing uterus. Electronically Signed   By: Alcide Clever M.D.   On: 12/22/2020 15:05   US PELVIC COMPLETE W TRANSVAGINAL AND TORSION R/O  Result Date: 12/22/2020 CLINICAL DATA:  Abdominal pain and vomiting, evaluate misplaced IUD EXAM: TRANSABDOMINAL AND TRANSVAGINAL ULTRASOUND OF  PELVIS DOPPLER ULTRASOUND OF OVARIES TECHNIQUE: Both transabdominal and transvaginal ultrasound examinations of the pelvis were performed. Transabdominal technique was performed for global imaging of the pelvis including uterus, ovaries, adnexal regions, and pelvic cul-de-sac. It was necessary to proceed with endovaginal exam following the transabdominal exam to visualize the ovaries. Color and duplex Doppler ultrasound was utilized to evaluate blood flow to the ovaries. COMPARISON:  CT from earlier in the same day. FINDINGS: Uterus Measurements: 9.9 x 4.4 x 6.3 cm. = volume: 143 mL. No fibroids or other mass visualized. Endometrium Thickness: 3 mm. IUD is noted in place although similar to that seen on prior CT examination. The IUD is inverted with the lateral limbs cupping the external cervical os. Right ovary Measurements: 2.3 x 1.2 x 2.0 cm = volume: 2.8 mL. Normal appearance/no adnexal mass. Pelvic varicosities are noted adjacent to the right adnexa. This corresponds to the hypodense structure seen on recent CT examination. No hydrosalpinx is noted. Left ovary Measurements: 2.1 x 1.1 x 1.4 cm. = volume: 1.7 mL. Normal appearance/no adnexal mass. Pulsed Doppler evaluation of both ovaries demonstrates normal low-resistance arterial and venous waveforms. Other findings No abnormal free fluid. IMPRESSION: Mild positioned IUD similar to that seen on prior CT examination. No evidence of right hydrosalpinx. Mild varicosities are noted in the right adnexal region. Normal appearing uterus. Electronically Signed   By: Alcide Clever M.D.   On: 12/22/2020 15:05    Procedures Procedures (including critical care time)  Medications Ordered in ED Medications  acetaminophen (TYLENOL) tablet 1,000 mg (has no administration in time range)  ondansetron (ZOFRAN-ODT) disintegrating tablet 4 mg (4 mg Oral Given 12/21/20 1659)  ondansetron (ZOFRAN-ODT) disintegrating tablet 4 mg (4 mg Oral Given 12/21/20 2005)  sodium chloride  0.9 % bolus 1,000 mL (0 mLs Intravenous Stopped 12/22/20 1242)  LORazepam (ATIVAN) injection 1 mg (1 mg Intravenous Given 12/22/20 0901)  sodium chloride 0.9 % bolus 1,000 mL (0 mLs Intravenous Stopped 12/22/20 1429)    ED Course  I have reviewed  the triage vital signs and the nursing notes.  Pertinent labs & imaging results that were available during my care of the patient were reviewed by me and considered in my medical decision making (see chart for details).    MDM Rules/Calculators/A&P                          Patient is a 30 year old female who presents with 3 days of nausea and vomiting.  She had some generalized abdominal tenderness.  Initially I gave her IV fluids and antiemetics and she stopped vomiting but had persistent abdominal pain.  Therefore a CT scan was performed which showed no acute abnormalities other than some possible hydrosalpinx and a possibly displaced IUD.  Pelvic ultrasound was performed which shows no acute abnormalities.  Her labs show an elevated creatinine from her baseline.  Her LFTs are normal.  She has no evidence of pancreatitis.  Her pregnancy test was negative.  She was given 2 L of IV fluids and is feeling better.  She has had no ongoing vomiting and is tolerating oral fluids.  Her u/a is pending and I will recheck her creatinine after fluids.  Can likely be discharged following this.  Dr. Kathrynn Humble to follow up on these results.  Final Clinical Impression(s) / ED Diagnoses Final diagnoses:  Non-intractable vomiting with nausea, unspecified vomiting type    Rx / DC Orders ED Discharge Orders         Ordered    ondansetron (ZOFRAN ODT) 4 MG disintegrating tablet        12/22/20 1515           Malvin Johns, MD 12/22/20 1612

## 2021-07-03 IMAGING — US US MFM FETAL BPP W/O NON-STRESS
1 series · 15 of 18 positions shown · non-contrast
Comparison: none

[Series 1: us mfm fetal bpp w/o non-stress · 18 acquisitions, 15 frames shown]
[im 1/18]
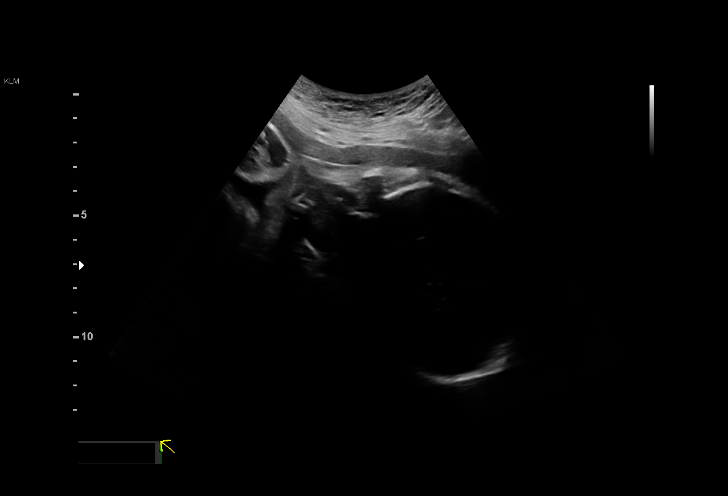
[im 2/18]
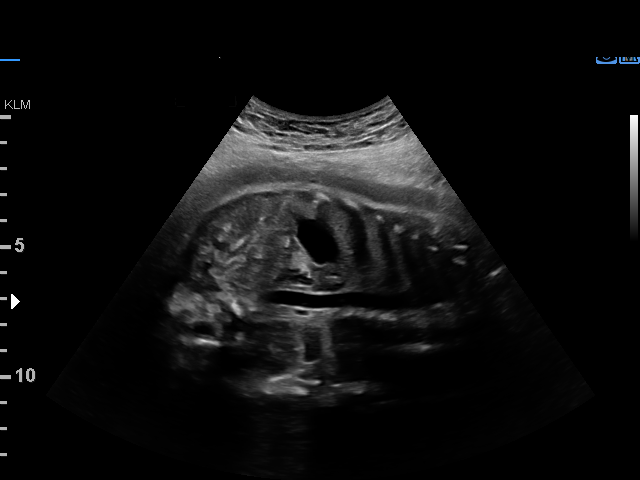
[im 4/18]
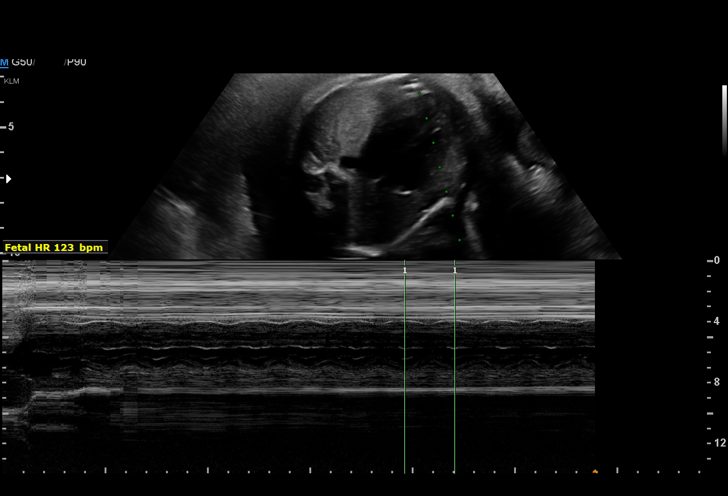
[im 5/18]
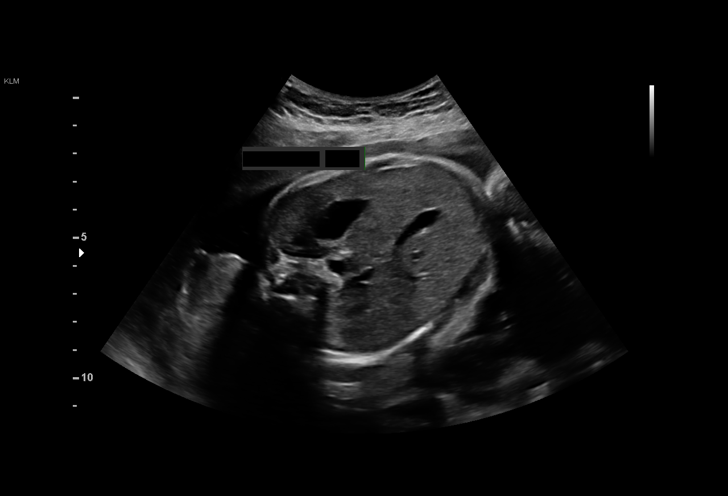
[im 6/18]
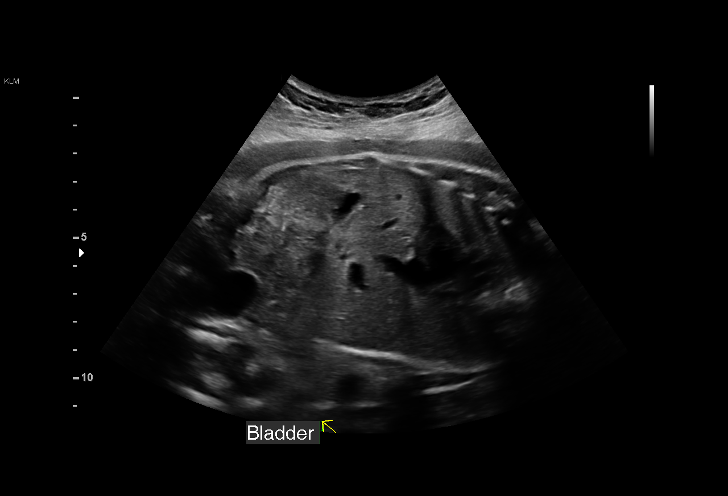
[im 7/18]
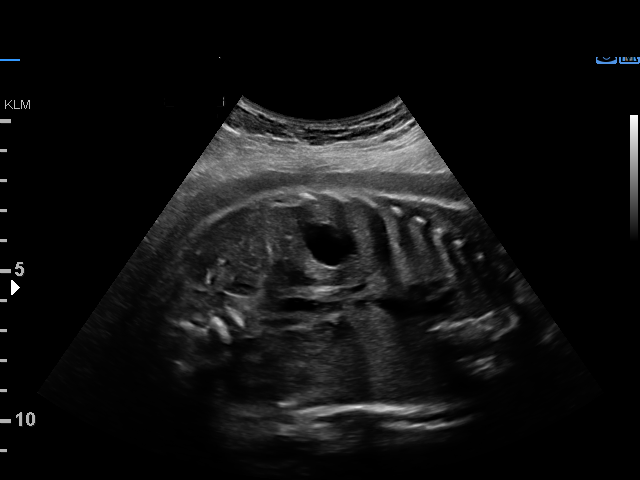
[im 8/18]
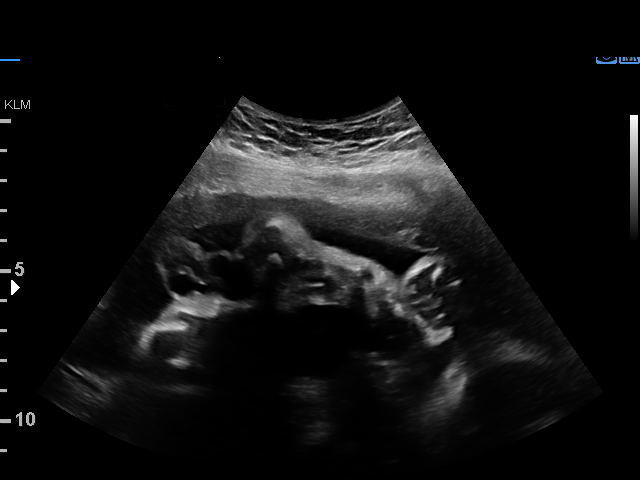
[im 10/18]
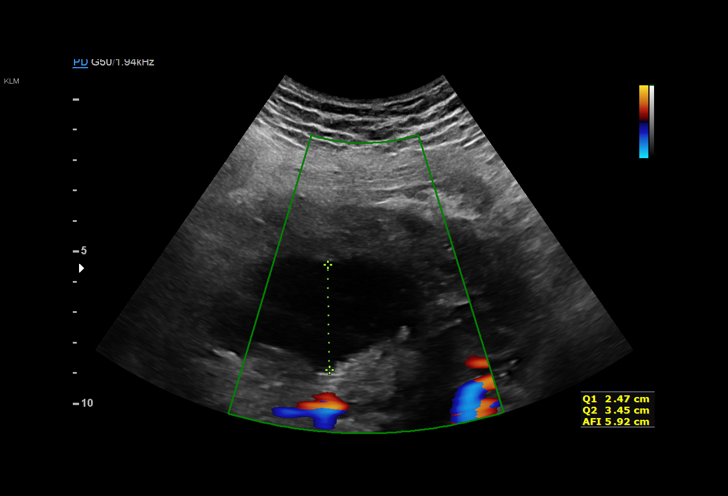
[im 11/18]
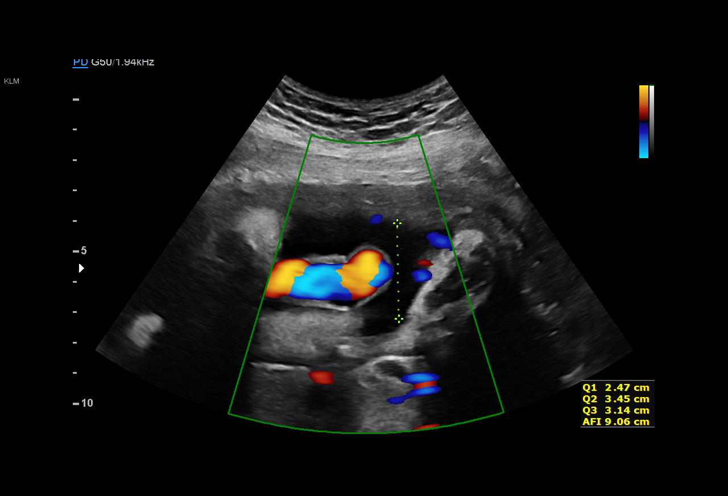
[im 12/18]
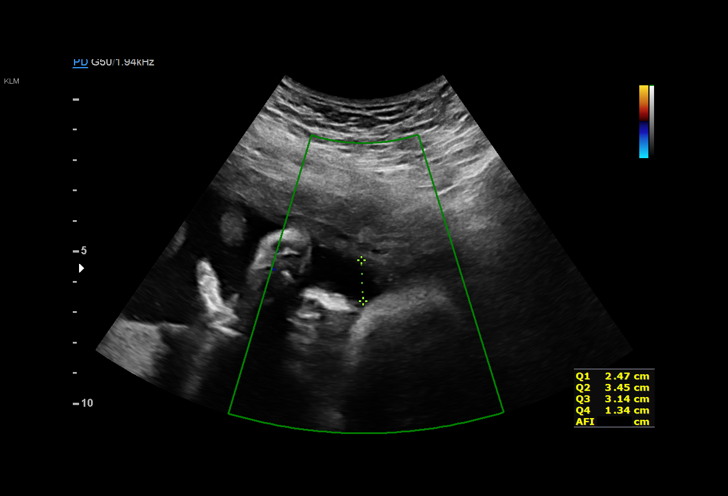
[im 13/18]
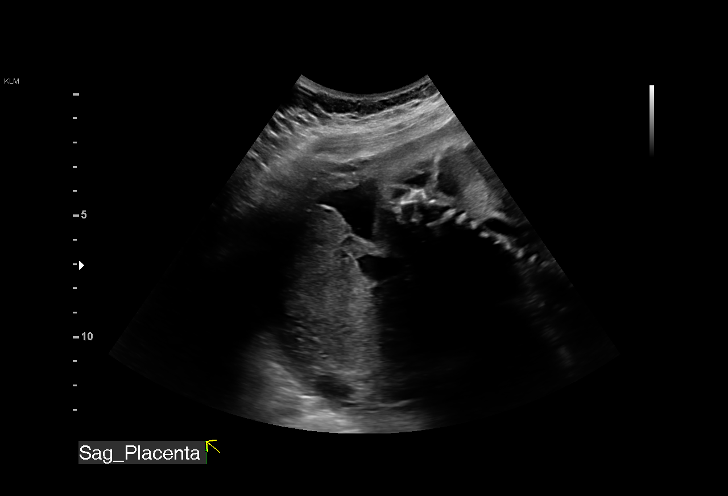
[im 14/18]
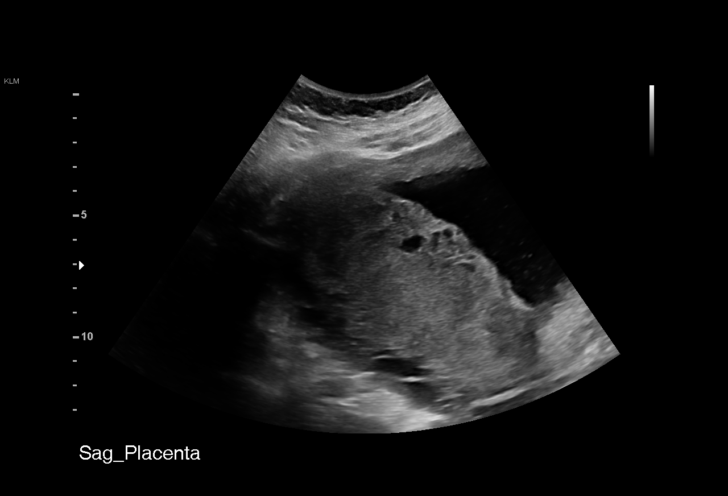
[im 16/18]
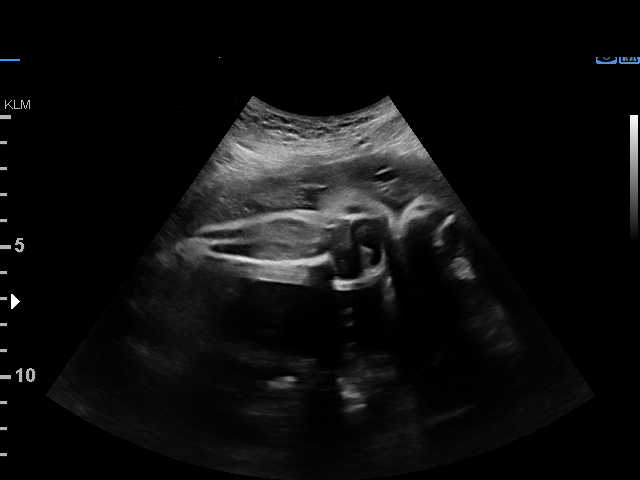
[im 17/18]
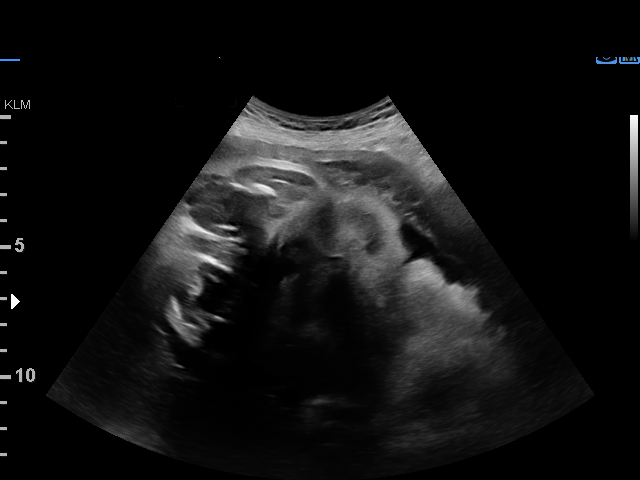
[im 18/18]
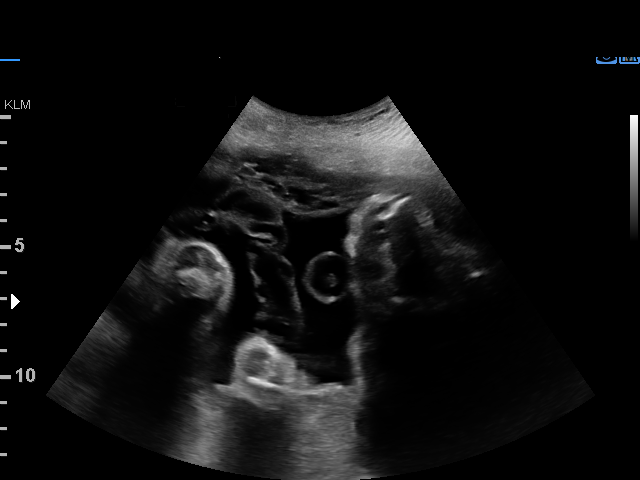

[15 of 18 positions shown; findings below may reference images not displayed]

Attending:        Atjeng Al Jono        Secondary Phy.:   [facility]-
                                                            MAU/Triage
 Referred By:      KALLIE CHAJON              [facility]]

 ----------------------------------------------------------------------

 ----------------------------------------------------------------------
Indications

  Decreased fetal movements, third trimester,
  unspecified
  32 weeks gestation of pregnancy
  Hyperemesis gravidarum
  Maternal care for known or suspected poor
  fetal growth, third trimester, not applicable or
  unspecified IUGR
 ----------------------------------------------------------------------
Fetal Evaluation

 Num Of Fetuses:         1
 Fetal Heart Rate(bpm):  123
 Cardiac Activity:       Observed
 Presentation:           Cephalic
 Placenta:               Anterior

 Amniotic Fluid
 AFI FV:      Within normal limits

 AFI Sum(cm)     %Tile       Largest Pocket(cm)
 10.4            20

 RUQ(cm)       RLQ(cm)       LUQ(cm)        LLQ(cm)

Biophysical Evaluation

 Amniotic F.V:   Within normal limits       F. Tone:        Observed
 F. Movement:    Observed                   Score:          [DATE]
 F. Breathing:   Observed
OB History

 Gravidity:    6         Term:   2        Prem:   1        SAB:   1
 TOP:          1        Living:  3
Gestational Age

 LMP:           32w 5d        Date:  04/07/19                 EDD:   01/12/20
 Best:          32w 5d     Det. By:  LMP  (04/07/19)          EDD:   01/12/20
Anatomy

 Stomach:               Appears normal, left   Bladder:                Appears normal
                        sided
Impression

 Patient return to [facility] with complaints of decreased fetal
 movements.  On previous ultrasound performed 2 days ago,
 the estimated fetal weight was at the 14th percentile and the
 abdominal circumference at less than 10 percentile.  She is
 sure of her LMP dates.
 Amniotic fluid is normal and good fetal activity seen.
 Antenatal testing is reassuring.  BPP [DATE].

 Because of inadequate prenatal care and possible fetal
 growth restriction (defined as abdominal circumference at
 less than 10 percentile), we recommend weekly BPP and
 umbilical artery Doppler studies.
Recommendations

 -Weekly BPP and Doppler till delivery.

                 Jp, Olanitori

## 2021-10-25 ENCOUNTER — Emergency Department (HOSPITAL_COMMUNITY)
Admission: EM | Admit: 2021-10-25 | Discharge: 2021-10-25 | Disposition: A | Discharge status: Home / Self Care | Payer: Medicaid Other | Source: Home / Self Care | Attending: Emergency Medicine | Admitting: Emergency Medicine

## 2021-10-25 ENCOUNTER — Encounter (HOSPITAL_COMMUNITY): Payer: Self-pay | Admitting: Emergency Medicine

## 2021-10-25 ENCOUNTER — Emergency Department (HOSPITAL_COMMUNITY): Payer: Medicaid Other

## 2021-10-25 ENCOUNTER — Emergency Department (HOSPITAL_COMMUNITY)
Admission: EM | Admit: 2021-10-25 | Discharge: 2021-10-25 | Disposition: A | Discharge status: Home / Self Care | Payer: Medicaid Other | Attending: Emergency Medicine | Admitting: Emergency Medicine

## 2021-10-25 ENCOUNTER — Other Ambulatory Visit: Payer: Self-pay

## 2021-10-25 DIAGNOSIS — R079 Chest pain, unspecified: Secondary | ICD-10-CM | POA: Insufficient documentation

## 2021-10-25 DIAGNOSIS — R519 Headache, unspecified: Secondary | ICD-10-CM | POA: Diagnosis not present

## 2021-10-25 DIAGNOSIS — I10 Essential (primary) hypertension: Secondary | ICD-10-CM | POA: Insufficient documentation

## 2021-10-25 DIAGNOSIS — N9489 Other specified conditions associated with female genital organs and menstrual cycle: Secondary | ICD-10-CM | POA: Diagnosis not present

## 2021-10-25 DIAGNOSIS — Z8616 Personal history of COVID-19: Secondary | ICD-10-CM | POA: Insufficient documentation

## 2021-10-25 DIAGNOSIS — J45909 Unspecified asthma, uncomplicated: Secondary | ICD-10-CM | POA: Insufficient documentation

## 2021-10-25 DIAGNOSIS — R5383 Other fatigue: Secondary | ICD-10-CM | POA: Insufficient documentation

## 2021-10-25 DIAGNOSIS — R112 Nausea with vomiting, unspecified: Secondary | ICD-10-CM

## 2021-10-25 DIAGNOSIS — Z79899 Other long term (current) drug therapy: Secondary | ICD-10-CM | POA: Insufficient documentation

## 2021-10-25 LAB — COMPREHENSIVE METABOLIC PANEL
ALT: 13 U/L (ref 0–44)
AST: 16 U/L (ref 15–41)
Albumin: 4 g/dL (ref 3.5–5.0)
Alkaline Phosphatase: 38 U/L (ref 38–126)
Anion gap: 6 (ref 5–15)
BUN: 8 mg/dL (ref 6–20)
CO2: 31 mmol/L (ref 22–32)
Calcium: 8.8 mg/dL — ABNORMAL LOW (ref 8.9–10.3)
Chloride: 103 mmol/L (ref 98–111)
Creatinine, Ser: 0.92 mg/dL (ref 0.44–1.00)
GFR, Estimated: >60 mL/min (ref >60)
Glucose, Bld: 108 mg/dL — ABNORMAL HIGH (ref 70–99)
Potassium: 4.1 mmol/L (ref 3.5–5.1)
Sodium: 140 mmol/L (ref 135–145)
Total Bilirubin: 0.3 mg/dL (ref 0.3–1.2)
Total Protein: 7.1 g/dL (ref 6.5–8.1)

## 2021-10-25 LAB — CBC
HCT: 36.8 % (ref 36.0–46.0)
Hemoglobin: 12.4 g/dL (ref 12.0–15.0)
MCH: 29.9 pg (ref 26.0–34.0)
MCHC: 33.7 g/dL (ref 30.0–36.0)
MCV: 88.7 fL (ref 80.0–100.0)
Platelets: 168 10*3/uL (ref 150–400)
RBC: 4.15 MIL/uL (ref 3.87–5.11)
RDW: 14.4 % (ref 11.5–15.5)
WBC: 10.2 10*3/uL (ref 4.0–10.5)
nRBC: 0 % (ref 0.0–0.2)

## 2021-10-25 LAB — URINALYSIS, ROUTINE W REFLEX MICROSCOPIC
Bilirubin Urine: NEGATIVE
Glucose, UA: NEGATIVE mg/dL
Hgb urine dipstick: NEGATIVE
Ketones, ur: NEGATIVE mg/dL
Leukocytes,Ua: NEGATIVE
Nitrite: NEGATIVE
Protein, ur: NEGATIVE mg/dL
Specific Gravity, Urine: 1.01 (ref 1.005–1.030)
pH: 8 (ref 5.0–8.0)

## 2021-10-25 LAB — TROPONIN I (HIGH SENSITIVITY)
Troponin I (High Sensitivity): 3 ng/L (ref <18)
Troponin I (High Sensitivity): 3 ng/L (ref <18)

## 2021-10-25 LAB — I-STAT BETA HCG BLOOD, ED (MC, WL, AP ONLY): I-stat hCG, quantitative: <5 m[IU]/mL (ref <5)

## 2021-10-25 LAB — LIPASE, BLOOD: Lipase: 27 U/L (ref 11–51)

## 2021-10-25 MED ORDER — METOCLOPRAMIDE HCL 5 MG/ML IJ SOLN
10.0000 mg | Freq: Once | INTRAMUSCULAR | Status: AC
  Administered 2021-10-25: 10 mg via INTRAMUSCULAR
  Filled 2021-10-25: qty 2

## 2021-10-25 MED ORDER — SODIUM CHLORIDE 0.9 % IV BOLUS
1000.0000 mL | Freq: Once | INTRAVENOUS | Status: AC
  Administered 2021-10-25: 1000 mL via INTRAVENOUS

## 2021-10-25 MED ORDER — ONDANSETRON 4 MG PO TBDP
4.0000 mg | ORAL_TABLET | Freq: Once | ORAL | Status: AC
  Administered 2021-10-25: 4 mg via ORAL
  Filled 2021-10-25: qty 1

## 2021-10-25 MED ORDER — ONDANSETRON 8 MG PO TBDP
8.0000 mg | ORAL_TABLET | Freq: Three times a day (TID) | ORAL | 0 refills | Status: DC | PRN
  Filled 2021-10-25: qty 20, 7d supply, fill #0

## 2021-10-25 MED ORDER — HALOPERIDOL LACTATE 5 MG/ML IJ SOLN
2.0000 mg | Freq: Once | INTRAMUSCULAR | Status: AC
  Administered 2021-10-25: 2 mg via INTRAVENOUS
  Filled 2021-10-25: qty 1

## 2021-10-25 MED ORDER — NAPROXEN 375 MG PO TABS
375.0000 mg | ORAL_TABLET | Freq: Two times a day (BID) | ORAL | 0 refills | Status: DC
  Filled 2021-10-25: qty 20, 10d supply, fill #0

## 2021-10-25 MED ORDER — ONDANSETRON HCL 4 MG/2ML IJ SOLN
4.0000 mg | Freq: Once | INTRAMUSCULAR | Status: AC | PRN
  Administered 2021-10-25: 4 mg via INTRAVENOUS
  Filled 2021-10-25: qty 2

## 2021-10-25 MED ORDER — DIPHENHYDRAMINE HCL 50 MG/ML IJ SOLN
12.5000 mg | Freq: Once | INTRAMUSCULAR | Status: AC
  Administered 2021-10-25: 12.5 mg via INTRAMUSCULAR
  Filled 2021-10-25: qty 1

## 2021-10-25 MED ORDER — ACETAMINOPHEN 500 MG PO TABS
1000.0000 mg | ORAL_TABLET | Freq: Once | ORAL | Status: AC
  Administered 2021-10-25: 1000 mg via ORAL
  Filled 2021-10-25: qty 2

## 2021-10-25 MED ORDER — IOHEXOL 350 MG/ML SOLN
80.0000 mL | Freq: Once | INTRAVENOUS | Status: AC | PRN
  Administered 2021-10-25: 80 mL via INTRAVENOUS

## 2021-10-25 NOTE — ED Provider Notes (Signed)
[facility]-EMERGENCY DEPT [facility] Emergency Department Provider Note MRN:  [id number]  Arrival date & time: 11/01/21     Chief Complaint   Vomiting History of Present Illness   Julie Coleman is a 30 y.o. year-old female with a history of hypertension presenting to the ED with chief complaint of vomiting.  1 week of persistent nausea, vomiting, intermittent chest pain, headaches.  Vomiting some black or coffee-ground material.  Denies abdominal pain, no vaginal bleeding or discharge.  Trouble eating or drinking anything.  Denies black stool.  Symptoms moderate, constant, no exacerbating or alleviating factors. Review of Systems  A complete 10 system review of systems was obtained and all systems are negative except as noted in the HPI and PMH.   Patient's Health History    Past Medical History:  Diagnosis Date   Asthma    albuteral inhaler   Decreased fetal movement 12/22/2019   Gestational diabetes    Hypertension     Past Surgical History:  Procedure Laterality Date   CESAREAN SECTION     CHOLECYSTECTOMY      No family history on file.  Social History   Socioeconomic History   Marital status: Single    Spouse name: Not on file   Number of children: Not on file   Years of education: Not on file   Highest education level: Not on file  Occupational History   Not on file  Tobacco Use   Smoking status: Never   Smokeless tobacco: Never  Vaping Use   Vaping Use: Never used  Substance and Sexual Activity   Alcohol use: Not Currently   Drug use: Yes    Types: Marijuana    Comment: last use yesterday 12/21/2019   Sexual activity: Not Currently    Birth control/protection: None  Other Topics Concern   Not on file  Social History Narrative   Not on file   Social Determinants of Health   Financial Resource Strain: Not on file  Food Insecurity: Not on file  Transportation Needs: Not on file  Physical Activity: Not on file  Stress: Not on file  Social Connections:  Not on file  Intimate Partner Violence: Not on file     Physical Exam   Vitals:   10/25/21 0700 10/25/21 0800  BP: (!) 143/108 126/89  Pulse: (!) 51 (!) 46  Resp: 17 18  Temp:    SpO2: 98% 99%    CONSTITUTIONAL: Well-appearing, NAD NEURO:  Alert and oriented x 3, no focal deficits EYES:  eyes equal and reactive ENT/NECK:  no LAD, no JVD CARDIO: Regular rate, well-perfused, normal S1 and S2 PULM:  CTAB no wheezing or rhonchi GI/GU:  normal bowel sounds, non-distended, non-tender MSK/SPINE:  No gross deformities, no edema SKIN:  no rash, atraumatic PSYCH:  Appropriate speech and behavior  *Additional and/or pertinent findings included in MDM below  Diagnostic and Interventional Summary    EKG Interpretation  Date/Time:    Ventricular Rate:    PR Interval:    QRS Duration:   QT Interval:    QTC Calculation:   R Axis:     Text Interpretation:         Labs Reviewed  COMPREHENSIVE METABOLIC PANEL - Abnormal; Notable for the following components:      Result Value   Glucose, Bld 108 (*)    Calcium 8.8 (*)    All other components within normal limits  URINALYSIS, ROUTINE W REFLEX MICROSCOPIC - Abnormal; Notable for the following components:  Color, Urine STRAW (*)    All other components within normal limits  LIPASE, BLOOD  CBC  I-STAT BETA HCG BLOOD, ED ([facility], [facility], [facility] ONLY)  TROPONIN I (HIGH SENSITIVITY)  TROPONIN I (HIGH SENSITIVITY)    CT HEAD WO CONTRAST ( )  Final Result    CT ABDOMEN PELVIS W CONTRAST  Final Result      Medications  ondansetron (ZOFRAN) injection 4 mg (4 mg Intravenous Given 10/25/21 0510)  sodium chloride 0.9 % bolus 1,000 mL (0 mLs Intravenous Stopped 10/25/21 0703)  haloperidol lactate (HALDOL) injection 2 mg (2 mg Intravenous Given 10/25/21 0703)  iohexol (OMNIPAQUE) 350 MG/ML injection 80 mL (80 mLs Intravenous Contrast Given 10/25/21 0724)     Procedures  /  Critical Care Procedures  ED Course and Medical Decision Making  I  have reviewed the triage vital signs, the nursing notes, and pertinent available records from the EMR.  Listed above are laboratory and imaging tests that I personally ordered, reviewed, and interpreted and then considered in my medical decision making (see below for details).  Chest pain in the setting of persistent nausea and vomiting, favor GI cause of pain.  Reporting coffee-ground emesis, hemodynamically stable, awaiting labs, screening plain film.  Abdomen soft and nontender, no rebound guarding or rigidity.     Initial work-up reassuring but continued symptoms, pain, nausea, vomiting.  Will CT to further evaluate.  Signed out to oncoming provider at shift change.  Elmer Sow. Pilar Plate, MD [facility] [email or web address]  Final Clinical Impressions(s) / ED Diagnoses     ICD-10-CM   1. Nausea and vomiting, unspecified vomiting type  R11.2       ED Discharge Orders          Ordered    ondansetron (ZOFRAN ODT) 8 MG disintegrating tablet  Every 8 hours PRN        10/25/21 0834             Discharge Instructions Discussed with and Provided to Patient:   Discharge Instructions   None       Sabas Sous, MD 11/01/21 2258

## 2021-10-25 NOTE — ED Triage Notes (Signed)
Patient states vomiting for 2 days with a coffee ground presentation per patient. Patient states she feels light headed. Zofran given during transport and 350 of NS  18 LAC 150/80 44 96 RA 123 CBG

## 2021-10-25 NOTE — Discharge Instructions (Addendum)
You have been seen and discharged from the emergency department.  Take pain medicine for headache.  Take nausea medicine as needed.  Stay well-hydrated.  Follow-up with your primary provider for reevaluation and further care. Take home medications as prescribed. If you have any worsening symptoms or further concerns for your health please return to an emergency department for further evaluation.

## 2021-10-25 NOTE — ED Triage Notes (Signed)
Patient reports coffee ground emesis x 2 days. Patient discharged earlier from ED, says when she got to bus stop she threw up again so she came back to ED . Denies pain.

## 2021-10-25 NOTE — ED Notes (Signed)
Patient given ginger ale for fluid challenge. 

## 2021-10-25 NOTE — ED Provider Notes (Signed)
[facility]   CSN: [id number] Arrival date & time: 10/25/21  0932     History Chief Complaint  Patient presents with   Emesis    Julie Coleman is a 30 y.o. female.  HPI  30 year old female with past medical history of HTN presents the emergency department ongoing nausea/vomiting.  Patient was seen here earlier this morning by previous physician for same complaints.  Blood work is reassuring, she was treated symptomatically, improved and was discharged.  She did not have a chance to fill her prescriptions I did not take any additional Zofran.  She states she continued to have vomiting so she return for evaluation.  Her complaints is unchanged.  She denies any developed fever, abdominal pain, GU symptoms, chest pain or shortness of breath.  Past Medical History:  Diagnosis Date   Asthma    albuteral inhaler   Decreased fetal movement 12/22/2019   Gestational diabetes    Hypertension     Patient Active Problem List   Diagnosis Date Noted   Gestational hypertension 12/24/2019   VBAC, delivered 12/23/2019   COVID-19 affecting pregnancy in third trimester 12/21/2019   History of cesarean delivery 12/19/2019   History of decreased fetal movements 12/19/2019   Supervision of high risk pregnancy, antepartum 11/21/2019   Hyperemesis 11/20/2019   IUGR (intrauterine growth restriction) affecting care of mother 11/20/2019   Hyperemesis affecting pregnancy, antepartum 11/13/2019   Rubella non-immune status, antepartum 11/12/2019   Cannabis hyperemesis syndrome concurrent with and due to cannabis abuse (HCC) 11/09/2019   Nausea and vomiting in pregnancy 11/08/2019   Heartburn during pregnancy, antepartum 10/25/2019   Trichomonal vaginitis during pregnancy 10/22/2019   Anemia affecting pregnancy 10/22/2019    Past Surgical History:  Procedure Laterality Date   CESAREAN SECTION     CHOLECYSTECTOMY       OB History     Gravida  6    Para  4   Term  3   Preterm  1   AB  2   Living  4      SAB  1   IAB  1   Ectopic      Multiple  0   Live Births  4           No family history on file.  Social History   Tobacco Use   Smoking status: Never   Smokeless tobacco: Never  Vaping Use   Vaping Use: Never used  Substance Use Topics   Alcohol use: Not Currently   Drug use: Yes    Types: Marijuana    Comment: last use yesterday 12/21/2019    Home Medications Prior to Admission medications   Medication Sig Start Date End Date Taking? Authorizing Provider  acetaminophen (TYLENOL) 325 MG tablet Take 2 tablets (650 mg total) by mouth every 6 (six) hours as needed (for pain scale < 4). 12/24/19   Fair, Hoyle Sauer, MD  amLODipine (NORVASC) 2.5 MG tablet Take 1 tablet (2.5 mg total) by mouth daily. 12/24/19   Fair, Hoyle Sauer, MD  diphenhydrAMINE (BENADRYL) 25 mg capsule Take 1 capsule (25 mg total) by mouth every 6 (six) hours as needed (nausea). Patient not taking: Reported on 12/04/2019 11/15/19   Conan Bowens, MD  docusate sodium (COLACE) 100 MG capsule Take 1 capsule (100 mg total) by mouth 2 (two) times daily. 11/17/19   Judeth Horn, NP  ferrous sulfate 325 (65 FE) MG tablet Take 1 tablet (325 mg  total) by mouth daily. 12/10/19 12/09/20  Nugent, Gerrie Nordmann, NP  glycopyrrolate (ROBINUL) 1 MG tablet Take 1 tablet (1 mg total) by mouth 3 (three) times daily. 11/22/19   Julianne Handler, CNM  ibuprofen (ADVIL) 600 MG tablet Take 1 tablet (600 mg total) by mouth every 6 (six) hours. 12/25/19   Chauncey Mann, MD  ondansetron (ZOFRAN ODT) 4 MG disintegrating tablet 4mg  ODT q4 hours prn nausea/vomit 12/22/20   Malvin Johns, MD  ondansetron (ZOFRAN ODT) 8 MG disintegrating tablet Take 1 tablet (8 mg total) by mouth every 8 (eight) hours as needed for nausea or vomiting. 10/25/21   Lacretia Leigh, MD  pantoprazole (PROTONIX) 40 MG tablet Take 1 tablet (40 mg total) by mouth 2 (two) times daily. 11/22/19   Julianne Handler, CNM  polyethylene glycol (MIRALAX / GLYCOLAX) 17 g packet Take 17 g by mouth daily. Patient not taking: Reported on 12/19/2019 11/21/19   Emily Filbert, MD  Prenatal Vit-Fe Fumarate-FA (PREPLUS) 27-1 MG TABS Take 1 tablet by mouth daily. 11/09/19   Anyanwu, Sallyanne Havers, MD  scopolamine (TRANSDERM-SCOP) 1 MG/3DAYS Place 1 patch (1.5 mg total) onto the skin every 3 (three) days. 11/22/19   Julianne Handler, CNM  sodium phosphate (FLEET) 7-19 GM/118ML ENEM Place 133 mLs (1 enema total) rectally daily as needed for severe constipation. 12/04/19   Donnamae Jude, MD    Allergies    Phenergan [promethazine hcl]  Review of Systems   Review of Systems  Constitutional:  Positive for appetite change. Negative for chills and fever.  HENT:  Negative for congestion.   Eyes:  Negative for visual disturbance.  Respiratory:  Negative for shortness of breath.   Cardiovascular:  Negative for chest pain.  Gastrointestinal:  Positive for nausea and vomiting. Negative for abdominal pain and diarrhea.  Genitourinary:  Negative for dysuria.  Skin:  Negative for rash.  Neurological:  Negative for headaches.   Physical Exam Updated Vital Signs BP 140/87   Pulse (!) 45   Temp 98.3 F (36.8 C) (Oral)   Resp 16   SpO2 97%   Physical Exam Vitals and nursing note reviewed.  Constitutional:      General: She is not in acute distress.    Appearance: Normal appearance. She is not diaphoretic.  HENT:     Head: Normocephalic.     Mouth/Throat:     Mouth: Mucous membranes are moist.  Cardiovascular:     Rate and Rhythm: Normal rate.  Pulmonary:     Effort: Pulmonary effort is normal. No respiratory distress.  Abdominal:     Palpations: Abdomen is soft.     Tenderness: There is no abdominal tenderness. There is no guarding or rebound.  Skin:    General: Skin is warm.  Neurological:     Mental Status: She is alert and oriented to person, place, and time. Mental status is at baseline.  Psychiatric:         Mood and Affect: Mood normal.    ED Results / Procedures / Treatments   Labs (all labs ordered are listed, but only abnormal results are displayed) Labs Reviewed - No data to display  EKG None  Radiology CT HEAD WO CONTRAST (5MM)  Result Date: 10/25/2021 CLINICAL DATA:  30 year old female with coffee-ground emesis for 2 days. Lightheaded. EXAM: CT HEAD WITHOUT CONTRAST TECHNIQUE: Contiguous axial images were obtained from the base of the skull through the vertex without intravenous contrast. COMPARISON:  None. FINDINGS: Brain: No midline shift,  ventriculomegaly, mass effect, evidence of mass lesion, intracranial hemorrhage or evidence of cortically based acute infarction. Partially empty sella. Gray-white matter differentiation is within normal limits throughout the brain. Vascular: No suspicious intracranial vascular hyperdensity. Skull: Negative. Sinuses/Orbits: Visualized paranasal sinuses and mastoids are clear. Other: Visualized orbits and scalp soft tissues are within normal limits. IMPRESSION: Negative noncontrast Head CT. Electronically Signed   By: Genevie Ann M.D.   On: 10/25/2021 07:43   CT ABDOMEN PELVIS W CONTRAST  Result Date: 10/25/2021 CLINICAL DATA:  30 year old female with coffee-ground emesis for 2 days. Lightheaded. EXAM: CT ABDOMEN AND PELVIS WITH CONTRAST TECHNIQUE: Multidetector CT imaging of the abdomen and pelvis was performed using the standard protocol following bolus administration of intravenous contrast. CONTRAST:  71mL OMNIPAQUE IOHEXOL 350 MG/ML SOLN COMPARISON:  Noncontrast CT Abdomen and Pelvis 12/22/2020. FINDINGS: Lower chest: Lower lung volumes with mild basilar atelectasis. Trace pleural effusions. No pericardial effusion. Hepatobiliary: Chronically absent gallbladder. Streak artifact from the upper extremities, liver is within normal limits. Pancreas: Negative. Spleen: Negative. Adrenals/Urinary Tract: Normal adrenal glands. Delayed images not included.  Right midpole 4-5 mm nephrolithiasis. But nonobstructed kidneys with symmetric renal enhancement. No pararenal inflammation. Mildly distended but otherwise unremarkable bladder. Stomach/Bowel: Mildly redundant but otherwise negative large bowel. Normal retrocecal appendix. Decompressed terminal ileum. No dilated small bowel. Decompressed stomach. Fluid-filled but nondilated proximal duodenum. Distal duodenum is decompressed. No gastro duodenal inflammation identified. No free air.  No free fluid in the abdomen. Vascular/Lymphatic: Major arterial structures in the abdomen and pelvis are patent and appear normal. Portal venous system is patent. No lymphadenopathy. Reproductive: IUD is in place but inverted (series 7, image 88). Similar appearance in January. New left ovarian round cyst with simple fluid density, 4.5 cm. Right ovary within normal limits. Other: Small volume of free fluid in the cul-de-sac with simple fluid density. Musculoskeletal: Chronic disc and endplate degeneration at the lumbosacral junction. No acute osseous abnormality identified. IMPRESSION: 1. Trace pleural effusions and mild pulmonary atelectasis. 2. No acute or inflammatory process identified in the abdomen or pelvis. 3. IUD is chronically inverted. 4. Right nephrolithiasis without obstructive uropathy. 5. 4.5 cm left ovarian simple-appearing cyst. No follow-up imaging is recommended. Reference: JACR 2020 Feb;17(2):248-254 Electronically Signed   By: Genevie Ann M.D.   On: 10/25/2021 07:50    Procedures Procedures   Medications Ordered in ED Medications  metoCLOPramide (REGLAN) injection 10 mg (10 mg Intramuscular Given 10/25/21 1846)  diphenhydrAMINE (BENADRYL) injection 12.5 mg (12.5 mg Intramuscular Given 10/25/21 1845)    ED Course  I have reviewed the triage vital signs and the nursing notes.  Pertinent labs & imaging results that were available during my care of the patient were reviewed by me and considered in my medical  decision making (see chart for details).    MDM Rules/Calculators/A&P                           30 year old female presents emergency department with an additional episode of vomiting.  Was previously seen in the ER for nausea/vomiting and fatigue.  Blood work was normal, she was treated with medication and felt better.  She was discharged, did not fill a prescription and had another episode of vomiting so she came back for evaluation.  Patient has history of hyperemesis and episodes of nausea/vomiting.  She just had labs earlier today, no indication for repeat emergent lab work.  Vitals are stable, abdomen is benign.  After further medication  here she is tolerating p.o.  Plan for outpatient follow-up.  Patient at this time appears safe and stable for discharge and will be treated as an outpatient.  Discharge plan and strict return to ED precautions discussed, patient verbalizes understanding and agreement.  Final Clinical Impression(s) / ED Diagnoses Final diagnoses:  None    Rx / DC Orders ED Discharge Orders     None        Lorelle Gibbs, DO 10/25/21 2235

## 2021-10-25 NOTE — ED Provider Notes (Signed)
Patient signed to me by Dr. Orland Dec pending completion of her work-up.  No acute abnormalities were noted.  Patient feels better and will discharge   Lorre Nick, MD 10/25/21 [phone]

## 2021-10-26 ENCOUNTER — Other Ambulatory Visit (HOSPITAL_COMMUNITY): Payer: Self-pay

## 2021-11-15 ENCOUNTER — Observation Stay (HOSPITAL_COMMUNITY)
Admission: EM | Admit: 2021-11-15 | Discharge: 2021-11-16 | Disposition: A | Discharge status: Home / Self Care | Payer: Medicaid Other | Attending: Emergency Medicine | Admitting: Emergency Medicine

## 2021-11-15 ENCOUNTER — Encounter (HOSPITAL_COMMUNITY): Payer: Self-pay | Admitting: Emergency Medicine

## 2021-11-15 ENCOUNTER — Other Ambulatory Visit: Payer: Self-pay

## 2021-11-15 DIAGNOSIS — R1110 Vomiting, unspecified: Secondary | ICD-10-CM | POA: Diagnosis not present

## 2021-11-15 DIAGNOSIS — J45909 Unspecified asthma, uncomplicated: Secondary | ICD-10-CM | POA: Insufficient documentation

## 2021-11-15 DIAGNOSIS — Z20822 Contact with and (suspected) exposure to covid-19: Secondary | ICD-10-CM | POA: Diagnosis not present

## 2021-11-15 DIAGNOSIS — J09X9 Influenza due to identified novel influenza A virus with other manifestations: Principal | ICD-10-CM | POA: Insufficient documentation

## 2021-11-15 DIAGNOSIS — R112 Nausea with vomiting, unspecified: Secondary | ICD-10-CM | POA: Diagnosis present

## 2021-11-15 DIAGNOSIS — J101 Influenza due to other identified influenza virus with other respiratory manifestations: Secondary | ICD-10-CM | POA: Diagnosis not present

## 2021-11-15 DIAGNOSIS — I10 Essential (primary) hypertension: Secondary | ICD-10-CM | POA: Diagnosis not present

## 2021-11-15 DIAGNOSIS — Z79899 Other long term (current) drug therapy: Secondary | ICD-10-CM | POA: Insufficient documentation

## 2021-11-15 DIAGNOSIS — R001 Bradycardia, unspecified: Secondary | ICD-10-CM

## 2021-11-15 LAB — URINALYSIS, ROUTINE W REFLEX MICROSCOPIC
Bacteria, UA: NONE SEEN
Bilirubin Urine: NEGATIVE
Glucose, UA: NEGATIVE mg/dL
Hgb urine dipstick: NEGATIVE
Ketones, ur: 80 mg/dL — AB
Leukocytes,Ua: NEGATIVE
Nitrite: NEGATIVE
Protein, ur: 30 mg/dL — AB
Specific Gravity, Urine: 1.021 (ref 1.005–1.030)
pH: 5 (ref 5.0–8.0)

## 2021-11-15 LAB — RAPID URINE DRUG SCREEN, HOSP PERFORMED
Amphetamines: POSITIVE — AB
Barbiturates: NOT DETECTED
Benzodiazepines: NOT DETECTED
Cocaine: NOT DETECTED
Opiates: NOT DETECTED
Tetrahydrocannabinol: POSITIVE — AB

## 2021-11-15 LAB — CBC WITH DIFFERENTIAL/PLATELET
Abs Immature Granulocytes: 0.03 10*3/uL (ref 0.00–0.07)
Basophils Absolute: 0.1 10*3/uL (ref 0.0–0.1)
Basophils Relative: 1 %
Eosinophils Absolute: 0 10*3/uL (ref 0.0–0.5)
Eosinophils Relative: 0 %
HCT: 38.8 % (ref 36.0–46.0)
Hemoglobin: 13.4 g/dL (ref 12.0–15.0)
Immature Granulocytes: 0 %
Lymphocytes Relative: 10 %
Lymphs Abs: 1 10*3/uL (ref 0.7–4.0)
MCH: 29.8 pg (ref 26.0–34.0)
MCHC: 34.5 g/dL (ref 30.0–36.0)
MCV: 86.4 fL (ref 80.0–100.0)
Monocytes Absolute: 0.3 10*3/uL (ref 0.1–1.0)
Monocytes Relative: 3 %
Neutro Abs: 8.7 10*3/uL — ABNORMAL HIGH (ref 1.7–7.7)
Neutrophils Relative %: 86 %
Platelets: 185 10*3/uL (ref 150–400)
RBC: 4.49 MIL/uL (ref 3.87–5.11)
RDW: 14.2 % (ref 11.5–15.5)
WBC: 10.1 10*3/uL (ref 4.0–10.5)
nRBC: 0 % (ref 0.0–0.2)

## 2021-11-15 LAB — RESP PANEL BY RT-PCR (FLU A&B, COVID) ARPGX2
Influenza A by PCR: POSITIVE — AB
Influenza B by PCR: NEGATIVE
SARS Coronavirus 2 by RT PCR: NEGATIVE

## 2021-11-15 LAB — COMPREHENSIVE METABOLIC PANEL
ALT: 21 U/L (ref 0–44)
AST: 25 U/L (ref 15–41)
Albumin: 4.2 g/dL (ref 3.5–5.0)
Alkaline Phosphatase: 47 U/L (ref 38–126)
Anion gap: 9 (ref 5–15)
BUN: 11 mg/dL (ref 6–20)
CO2: 23 mmol/L (ref 22–32)
Calcium: 9.4 mg/dL (ref 8.9–10.3)
Chloride: 103 mmol/L (ref 98–111)
Creatinine, Ser: 0.91 mg/dL (ref 0.44–1.00)
GFR, Estimated: >60 mL/min (ref >60)
Glucose, Bld: 137 mg/dL — ABNORMAL HIGH (ref 70–99)
Potassium: 4.1 mmol/L (ref 3.5–5.1)
Sodium: 135 mmol/L (ref 135–145)
Total Bilirubin: 0.6 mg/dL (ref 0.3–1.2)
Total Protein: 7.6 g/dL (ref 6.5–8.1)

## 2021-11-15 LAB — I-STAT BETA HCG BLOOD, ED (MC, WL, AP ONLY): I-stat hCG, quantitative: <5 m[IU]/mL (ref <5)

## 2021-11-15 LAB — LIPASE, BLOOD: Lipase: 35 U/L (ref 11–51)

## 2021-11-15 MED ORDER — DROPERIDOL 2.5 MG/ML IJ SOLN
2.5000 mg | Freq: Once | INTRAMUSCULAR | Status: AC
  Administered 2021-11-15: 12:00:00 2.5 mg via INTRAVENOUS
  Filled 2021-11-15: qty 2

## 2021-11-15 MED ORDER — ACETAMINOPHEN 325 MG PO TABS
650.0000 mg | ORAL_TABLET | Freq: Four times a day (QID) | ORAL | Status: DC | PRN
  Administered 2021-11-16: 09:00:00 650 mg via ORAL
  Filled 2021-11-15: qty 2

## 2021-11-15 MED ORDER — CAPSAICIN 0.025 % EX CREA
TOPICAL_CREAM | Freq: Two times a day (BID) | CUTANEOUS | Status: DC
  Filled 2021-11-15 (×2): qty 60

## 2021-11-15 MED ORDER — OSELTAMIVIR PHOSPHATE 75 MG PO CAPS: 75.0000 mg | ORAL_CAPSULE | Freq: Two times a day (BID) | ORAL | 0 refills | Status: AC

## 2021-11-15 MED ORDER — SODIUM CHLORIDE 0.9 % IV BOLUS
1000.0000 mL | Freq: Once | INTRAVENOUS | Status: AC
  Administered 2021-11-15: 13:00:00 1000 mL via INTRAVENOUS

## 2021-11-15 MED ORDER — SODIUM CHLORIDE 0.9 % IV BOLUS
1000.0000 mL | Freq: Once | INTRAVENOUS | Status: AC
  Administered 2021-11-15: 10:00:00 1000 mL via INTRAVENOUS

## 2021-11-15 MED ORDER — ACETAMINOPHEN 650 MG RE SUPP
650.0000 mg | Freq: Four times a day (QID) | RECTAL | Status: DC | PRN
  Filled 2021-11-15: qty 1

## 2021-11-15 MED ORDER — ONDANSETRON HCL 4 MG/2ML IJ SOLN
4.0000 mg | Freq: Four times a day (QID) | INTRAMUSCULAR | Status: DC | PRN
  Administered 2021-11-15 – 2021-11-16 (×2): 4 mg via INTRAVENOUS
  Filled 2021-11-15 (×2): qty 2

## 2021-11-15 MED ORDER — ONDANSETRON 4 MG PO TBDP
4.0000 mg | ORAL_TABLET | Freq: Once | ORAL | Status: AC
  Administered 2021-11-15: 09:00:00 4 mg via ORAL
  Filled 2021-11-15: qty 1

## 2021-11-15 MED ORDER — ONDANSETRON HCL 4 MG/2ML IJ SOLN
4.0000 mg | Freq: Once | INTRAMUSCULAR | Status: AC
  Administered 2021-11-15: 10:00:00 4 mg via INTRAVENOUS
  Filled 2021-11-15: qty 2

## 2021-11-15 MED ORDER — SODIUM CHLORIDE 0.9 % IV SOLN: INTRAVENOUS | Status: DC

## 2021-11-15 MED ORDER — OSELTAMIVIR PHOSPHATE 75 MG PO CAPS
75.0000 mg | ORAL_CAPSULE | Freq: Two times a day (BID) | ORAL | Status: DC
  Administered 2021-11-16: 09:00:00 75 mg via ORAL
  Filled 2021-11-15 (×3): qty 1

## 2021-11-15 MED ORDER — ENOXAPARIN SODIUM 40 MG/0.4ML IJ SOSY
40.0000 mg | PREFILLED_SYRINGE | INTRAMUSCULAR | Status: DC
  Administered 2021-11-15: 17:00:00 40 mg via SUBCUTANEOUS
  Filled 2021-11-15 (×2): qty 0.4

## 2021-11-15 NOTE — ED Notes (Signed)
Pt incontinent of bladder. Linens changed. Pt provided with soap and water to clean herself up. Gown placed on pt. Warm blankets provided.

## 2021-11-15 NOTE — ED Provider Notes (Signed)
[facility] EMERGENCY DEPARTMENT Provider Note   CSN: [id number] Arrival date & time: 11/15/21  0825     History Chief Complaint  Patient presents with   Vomiting   Abdominal Pain    Julie Coleman is a 30 y.o. female with a past medical history significant for asthma, hypertension, and history of cannabinoid hyperemesis syndrome who presents to the ED due to nausea and vomiting that started last night.  Patient endorses numerous episodes of nonbloody, nonbilious emesis associated with generalized abdominal pain.  She also endorses loose stool.  No fever or chills.  Patient admits to smoking marijuana frequently.  Denies vaginal and urinary symptoms.  No sick contacts or known COVID exposures.  Patient states she is unable to tolerate p.o.  No treatment prior to arrival.  History obtained from patient and past medical records. No interpreter used during encounter.       Past Medical History:  Diagnosis Date   Asthma    albuteral inhaler   Decreased fetal movement 12/22/2019   Gestational diabetes    Hypertension     Patient Active Problem List   Diagnosis Date Noted   Gestational hypertension 12/24/2019   VBAC, delivered 12/23/2019   COVID-19 affecting pregnancy in third trimester 12/21/2019   History of cesarean delivery 12/19/2019   History of decreased fetal movements 12/19/2019   Supervision of high risk pregnancy, antepartum 11/21/2019   Hyperemesis 11/20/2019   IUGR (intrauterine growth restriction) affecting care of mother 11/20/2019   Hyperemesis affecting pregnancy, antepartum 11/13/2019   Rubella non-immune status, antepartum 11/12/2019   Cannabis hyperemesis syndrome concurrent with and due to cannabis abuse (HCC) 11/09/2019   Nausea and vomiting in pregnancy 11/08/2019   Heartburn during pregnancy, antepartum 10/25/2019   Trichomonal vaginitis during pregnancy 10/22/2019   Anemia affecting pregnancy 10/22/2019    Past Surgical History:   Procedure Laterality Date   CESAREAN SECTION     CHOLECYSTECTOMY       OB History     Gravida  6   Para  4   Term  3   Preterm  1   AB  2   Living  4      SAB  1   IAB  1   Ectopic      Multiple  0   Live Births  4           No family history on file.  Social History   Tobacco Use   Smoking status: Never   Smokeless tobacco: Never  Vaping Use   Vaping Use: Never used  Substance Use Topics   Alcohol use: Not Currently   Drug use: Yes    Types: Marijuana    Comment: last use yesterday 12/21/2019    Home Medications Prior to Admission medications   Medication Sig Start Date End Date Taking? Authorizing Provider  oseltamivir (TAMIFLU) 75 MG capsule Take 1 capsule (75 mg total) by mouth every 12 (twelve) hours. 11/15/21  Yes Cylinda Santoli, Merla Riches, PA-C  acetaminophen (TYLENOL) 325 MG tablet Take 2 tablets (650 mg total) by mouth every 6 (six) hours as needed (for pain scale < 4). Patient not taking: Reported on 10/25/2021 12/24/19   Joselyn Arrow, MD  amLODipine (NORVASC) 2.5 MG tablet Take 1 tablet (2.5 mg total) by mouth daily. Patient not taking: Reported on 10/25/2021 12/24/19   Joselyn Arrow, MD  diphenhydrAMINE (BENADRYL) 25 mg capsule Take 1 capsule (25 mg total) by mouth every 6 (six)  hours as needed (nausea). Patient not taking: No sig reported 11/15/19   Conan Bowens, MD  docusate sodium (COLACE) 100 MG capsule Take 1 capsule (100 mg total) by mouth 2 (two) times daily. Patient not taking: Reported on 10/25/2021 11/17/19   Judeth Horn, NP  ferrous sulfate 325 (65 FE) MG tablet Take 1 tablet (325 mg total) by mouth daily. Patient not taking: Reported on 10/25/2021 12/10/19 12/09/20  Nugent, Odie Sera, NP  glycopyrrolate (ROBINUL) 1 MG tablet Take 1 tablet (1 mg total) by mouth 3 (three) times daily. Patient not taking: Reported on 10/25/2021 11/22/19   Donette Larry, CNM  naproxen (NAPROSYN) 375 MG tablet Take 1 tablet (375 mg total) by mouth 2  (two) times daily with a meal. 10/25/21   Horton, Kristie M, DO  ondansetron (ZOFRAN ODT) 4 MG disintegrating tablet 4mg  ODT q4 hours prn nausea/vomit Patient not taking: Reported on 10/25/2021 12/22/20   02/19/21, MD  ondansetron (ZOFRAN ODT) 8 MG disintegrating tablet Take 1 tablet (8 mg total) by mouth every 8 (eight) hours as needed for nausea or vomiting. Patient not taking: Reported on 10/25/2021 10/25/21   13/6/22, MD  pantoprazole (PROTONIX) 40 MG tablet Take 1 tablet (40 mg total) by mouth 2 (two) times daily. Patient not taking: Reported on 10/25/2021 11/22/19   14/3/20, CNM  polyethylene glycol (MIRALAX / GLYCOLAX) 17 g packet Take 17 g by mouth daily. Patient not taking: No sig reported 11/21/19   14/2/20, MD  Prenatal Vit-Fe Fumarate-FA (PREPLUS) 27-1 MG TABS Take 1 tablet by mouth daily. Patient not taking: Reported on 10/25/2021 11/09/19   11/11/19, MD  scopolamine (TRANSDERM-SCOP) 1 MG/3DAYS Place 1 patch (1.5 mg total) onto the skin every 3 (three) days. Patient not taking: Reported on 10/25/2021 11/22/19   14/3/20, CNM  sodium phosphate (FLEET) 7-19 GM/118ML ENEM Place 133 mLs (1 enema total) rectally daily as needed for severe constipation. Patient not taking: Reported on 10/25/2021 12/04/19   12/06/19, MD    Allergies    Phenergan [promethazine hcl]  Review of Systems   Review of Systems  Constitutional:  Negative for chills and fever.  Respiratory:  Negative for shortness of breath.   Cardiovascular:  Negative for chest pain.  Gastrointestinal:  Positive for abdominal pain, diarrhea, nausea and vomiting.  Genitourinary:  Negative for dysuria and vaginal discharge.  All other systems reviewed and are negative.  Physical Exam Updated Vital Signs BP 129/87   Pulse 61   Temp 98.2 F (36.8 C) (Oral)   Resp 19   SpO2 100%   Physical Exam Vitals and nursing note reviewed.  Constitutional:      General: She is not in acute  distress.    Appearance: She is not ill-appearing.  HENT:     Head: Normocephalic.  Eyes:     Pupils: Pupils are equal, round, and reactive to light.  Cardiovascular:     Rate and Rhythm: Normal rate and regular rhythm.     Pulses: Normal pulses.     Heart sounds: Normal heart sounds. No murmur heard.   No friction rub. No gallop.  Pulmonary:     Effort: Pulmonary effort is normal.     Breath sounds: Normal breath sounds.  Abdominal:     General: Abdomen is flat. There is no distension.     Palpations: Abdomen is soft.     Tenderness: There is abdominal tenderness. There is no guarding or rebound.  Comments: Diffuse tenderness. No focal tenderness. No rebound or guarding.   Musculoskeletal:        General: Normal range of motion.     Cervical back: Neck supple.  Skin:    General: Skin is warm and dry.  Neurological:     General: No focal deficit present.     Mental Status: She is alert.  Psychiatric:        Mood and Affect: Mood normal.        Behavior: Behavior normal.    ED Results / Procedures / Treatments   Labs (all labs ordered are listed, but only abnormal results are displayed) Labs Reviewed  RESP PANEL BY RT-PCR (FLU A&B, COVID) ARPGX2 - Abnormal; Notable for the following components:      Result Value   Influenza A by PCR POSITIVE (*)    All other components within normal limits  CBC WITH DIFFERENTIAL/PLATELET - Abnormal; Notable for the following components:   Neutro Abs 8.7 (*)    All other components within normal limits  COMPREHENSIVE METABOLIC PANEL - Abnormal; Notable for the following components:   Glucose, Bld 137 (*)    All other components within normal limits  LIPASE, BLOOD  URINALYSIS, ROUTINE W REFLEX MICROSCOPIC  I-STAT BETA HCG BLOOD, ED ([facility], [facility], [facility] ONLY)    EKG EKG Interpretation  Date/Time:  Sunday November 15 2021 09:04:42 EST Ventricular Rate:  64 PR Interval:  154 QRS Duration: 76 QT Interval:  380 QTC Calculation: 392 R  Axis:   87 Text Interpretation: Normal sinus rhythm with sinus arrhythmia Nonspecific ST and T wave abnormality Abnormal ECG No significant change since last tracing Confirmed by Lawsing, James ([id number]) on 11/15/2021 1:38:42 PM  Radiology No results found.  Procedures Procedures   Medications Ordered in ED Medications  ondansetron (ZOFRAN-ODT) disintegrating tablet 4 mg (4 mg Oral Given 11/15/21 0848)  sodium chloride 0.9 % bolus 1,000 mL ( Intravenous Restarted 11/15/21 1324)  ondansetron (ZOFRAN) injection 4 mg (4 mg Intravenous Given 11/15/21 1023)  droperidol (INAPSINE) 2.5 MG/ML injection 2.5 mg (2.5 mg Intravenous Given 11/15/21 1142)  sodium chloride 0.9 % bolus 1,000 mL (1,000 mLs Intravenous New Bag/Given 11/15/21 1324)    ED Course  I have reviewed the triage vital signs and the nursing notes.  Pertinent labs & imaging results that were available during my care of the patient were reviewed by me and considered in my medical decision making (see chart for details).  Clinical Course as of 11/15/21 1416  Sun Nov 15, 2021  1203 Influenza A By PCR(!): POSITIVE [CA]    Clinical Course User Index [CA] ,  C, PA-C   MDM Rules/Calculators/A&P                          30  year old female presents to the ED due to numerous episodes of nonbloody, nonbilious emesis that started last night associated with generalized abdominal pain and loose stool.  Upon arrival, stable vitals.  Patient in no acute distress.  Patient smokes marijuana frequently and has a history of cannabinoid hyperemesis syndrome.  Abdomen soft, nondistended with diffuse tenderness.  No focal tenderness.  No rebound or guarding.  Low suspicion for acute abdomen.  IV fluids and Zofran given.  Labs ordered at triage. Will add COVID/influenza test to rule out viral etiology.   CBC reassuring.  No leukocytosis and normal hemoglobin.  Lipase normal at 35.  Low suspicion for pancreatitis.  CMP significant for  mild  hyperglycemia 137 with no evidence of DKA.  Normal renal function.  No major electrolyte derangements.  Influenza a positive.  Pregnancy test negative.  Upon reassessment, patient is still actively vomiting after 2 doses of Zofran and Droperidol. Emesis could be secondary to both influenza and cannabinoid hyperemesis syndrome. Will consult hospitalist for admission.   2:15 PM Discussed with Dr. Laroy Apple with Family Medicine who agrees to admit patient for further treatment. COVID negative.  Final Clinical Impression(s) / ED Diagnoses Final diagnoses:  Influenza A  Nausea and vomiting, unspecified vomiting type    Rx / DC Orders ED Discharge Orders          Ordered    oseltamivir (TAMIFLU) 75 MG capsule  Every 12 hours        11/15/21 1214             Jesusita Oka 11/15/21 1532    Ernie Avena, MD 11/15/21 1752

## 2021-11-15 NOTE — ED Notes (Signed)
IV team at bedside starting pt's 3rd IV.

## 2021-11-15 NOTE — ED Provider Notes (Signed)
Emergency Medicine Provider Triage Evaluation Note  Julie Coleman , a 30 y.o. female  was evaluated in triage.  Pt complains of emesis.  Review of Systems  Positive: Abd pain, nausea, vomiting, loose stool Negative: Fever, cp, sob, cough, dysuria  Physical Exam  There were no vitals taken for this visit. Gen:   Awake, actively vomiting Resp:  Normal effort  MSK:   Moves extremities without difficulty  Other:    Medical Decision Making  Medically screening exam initiated at 8:41 AM.  Appropriate orders placed.  [name] was informed that the remainder of the evaluation will be completed by another provider, this initial triage assessment does not replace that evaluation, and the importance of remaining in the ED until their evaluation is complete.  Pt with hx of intractable nausea/vomiting, hx of marijuana use here with abd pain and associated n/v, and loose stool since yesterday.  Last marijuana use 2 days ago.     Fayrene Helper, PA-C 11/15/21 1583    Gloris Manchester, MD 11/17/21 [phone]

## 2021-11-15 NOTE — H&P (Addendum)
[facility] Admission History and Physical Service Pager: [phone]  Patient name: Julie Coleman Medical record number: [id number] Date of birth: April 19, 1991 Age: 30 y.o. Gender: female  Primary Care Provider: Pcp, No Consultants: None Code Status: FULL   Preferred Emergency Contact: Lyric, Honnold (Mother), [phone] (Mobile)  Chief Complaint: Vomiting  Assessment and Plan: Julie Coleman is a 30 y.o. female presenting with intractable vomiting and flu a positive. PMH is significant for cannabis hyperemesis syndrome, HTN, high risk pregnancy last year with IUGR  Intractable vomiting Patient presented to the ED with a history of vomiting that started last night.  Blood pressure in the ED was [id number] systolic which became more normotensive.  Patient was bradycardic to 40s and 50s.  Otherwise stable on room air.  BMP and CBC non-remarkable.  Lipase 35. hCG <5.  Influenza A positive. Patient was given 2x 4mg  zofran injections and 1x droperidol 2.5 mg injection without resolutions of symptoms. She was also given 3x 1L NS boluses in ED. On exam patient was extremely lethargic with emesis bag containing green fluid in hand. Abdomen was soft, hypoactive bowel sounds and had some tenderness to palpation in LUQ and RUQ and RLQ but when palpating again complained of no tenderness.  Mucous membranes moderately dry.  Differential includes likely recurrent emesis from known cannabis hyperemesis syndrome, patient's last use was 2 days ago exacerbated by influenza A infection. Unlikely to be related to appendicitis given patient does not have documented fever or leukocytosis.  Unlikely to be related to pancreatitis given normal lipase of 35. Increased ICP less likely given no focal deficits on neurologic exam however patient is quite sedated (talked about in problems below).  Unlikely to be due to pregnancy as beta-hCG negative.  Patient does not have a gallbladder (per Nov 2022 CT ABD)  so not likely related to this. -Admit to [facility], attending Dr. McDiarmid, Med-Surg -Normal saline MIVF -Encourage oral intake as tolerated -IV Zofran 4 mg every 6 hours as needed (QTc is 392) -Tylenol 650 mg every 6 hours as needed -Low threshold for obtaining abdominal imaging if fevering/worsening clinically -capsaicin cream for abdominal tenderness -UA -AM CBC, CMP  Lethargy Patient on exam was excessively tired with her falling asleep during history taking.  She says she feels more tired than usual. Not noted on ED provider exam. No focal neurologic deficits and alert and oriented x4 when awake.  Differential includes likely related to droperidol CNS sedating effects. Denies any drug use other than marijuana but can check for other use.  -UDS -Consider head imaging if changes in neuro exam  Bradycardia  HR 40s-50s initially in ED. EKG showed sinus rhythm with QTc 392. Suspect this is due to droperiol given by ED provider.  -Cardiac monitoring  Influenza A Incidentally found to be influenza positive. No focal lung findings, likely contributing to emesis and "feeling sick" -Oseltamivir 75 mg BID Day 1/5  History of gHTN Not taking any antihypertensives at home. Bps 120s to 150s /50s to 90s. -Monitor vitals  Cannabis use disorder Everyday user in chart review. Last use she said was 2 days ago. -encourage cessation  Hyperglycemia 137 on BMP. Last A1c 5.3 on 11/20/2019. Likely 2/2 to stress.  -Monitor  FEN/GI: Regular Diet Prophylaxis: Lovenox  Disposition: Med-Surg  History of Present Illness:  Julie Coleman is a 30 y.o. female presenting with intractable vomiting.   Says she started vomiting last night, and she is unsure of what color her vomiting  has been but denies any overt blood. She says her stomach is aching and she has non-bloody emesis. She says she last ate yesterday morning. She has vomited >10 times today. Benadryl has helped in the past with emesis control. She  has an allergy in the [facility] the phenergan which she states she "starts shaking" when she gets it.  Reports fever today but she is unsure when or how high it was. She used marijuana two days ago. She unsure of when she last drank. Denies fever. Endorses rhinorrhea, fatigue and decreased appetite.   Review Of Systems: Per HPI with the following additions:   Review of Systems  Constitutional:  Positive for appetite change and fatigue.  HENT:  Positive for rhinorrhea.   Eyes:  Positive for visual disturbance.       "Seeing stars" denies double vision or blurry vision  Respiratory:  Negative for shortness of breath.   Cardiovascular:  Negative for chest pain and leg swelling.  Gastrointestinal:  Positive for abdominal pain, nausea and vomiting. Negative for abdominal distention, constipation and diarrhea.  Genitourinary:  Negative for dysuria.  Skin:  Negative for rash.  Neurological:  Positive for dizziness, weakness and headaches.    Patient Active Problem List   Diagnosis Date Noted   Gestational hypertension 12/24/2019   VBAC, delivered 12/23/2019   COVID-19 affecting pregnancy in third trimester 12/21/2019   History of cesarean delivery 12/19/2019   History of decreased fetal movements 12/19/2019   Supervision of high risk pregnancy, antepartum 11/21/2019   Hyperemesis 11/20/2019   IUGR (intrauterine growth restriction) affecting care of mother 11/20/2019   Hyperemesis affecting pregnancy, antepartum 11/13/2019   Rubella non-immune status, antepartum 11/12/2019   Cannabis hyperemesis syndrome concurrent with and due to cannabis abuse (HCC) 11/09/2019   Nausea and vomiting in pregnancy 11/08/2019   Heartburn during pregnancy, antepartum 10/25/2019   Trichomonal vaginitis during pregnancy 10/22/2019   Anemia affecting pregnancy 10/22/2019    Past Medical History: Past Medical History:  Diagnosis Date   Asthma    albuteral inhaler   Decreased fetal movement 12/22/2019   Gestational  diabetes    Hypertension     Past Surgical History: Past Surgical History:  Procedure Laterality Date   CESAREAN SECTION     CHOLECYSTECTOMY      Social History: Social History   Tobacco Use   Smoking status: Never   Smokeless tobacco: Never  Vaping Use   Vaping Use: Never used  Substance Use Topics   Alcohol use: Not Currently   Drug use: Yes    Types: Marijuana    Comment: last use yesterday 12/21/2019   Additional social history:   Please also refer to relevant sections of EMR.  Family History: No family history on file.  Allergies and Medications: Allergies  Allergen Reactions   Phenergan [Promethazine Hcl]     Tardive dyskinesia    No current facility-administered medications on file prior to encounter.   Current Outpatient Medications on File Prior to Encounter  Medication Sig Dispense Refill   acetaminophen (TYLENOL) 325 MG tablet Take 2 tablets (650 mg total) by mouth every 6 (six) hours as needed (for pain scale < 4). (Patient not taking: Reported on 10/25/2021) 30 tablet `0   amLODipine (NORVASC) 2.5 MG tablet Take 1 tablet (2.5 mg total) by mouth daily. (Patient not taking: Reported on 10/25/2021) 60 tablet 0   diphenhydrAMINE (BENADRYL) 25 mg capsule Take 1 capsule (25 mg total) by mouth every 6 (six) hours as needed (nausea). (  Patient not taking: No sig reported) 30 capsule 0   docusate sodium (COLACE) 100 MG capsule Take 1 capsule (100 mg total) by mouth 2 (two) times daily. (Patient not taking: Reported on 10/25/2021) 60 capsule 0   ferrous sulfate 325 (65 FE) MG tablet Take 1 tablet (325 mg total) by mouth daily. (Patient not taking: Reported on 10/25/2021) 30 tablet 3   glycopyrrolate (ROBINUL) 1 MG tablet Take 1 tablet (1 mg total) by mouth 3 (three) times daily. (Patient not taking: Reported on 10/25/2021) 60 tablet 1   naproxen (NAPROSYN) 375 MG tablet Take 1 tablet (375 mg total) by mouth 2 (two) times daily with a meal. 20 tablet 0   ondansetron (ZOFRAN  ODT) 4 MG disintegrating tablet 4mg  ODT q4 hours prn nausea/vomit (Patient not taking: Reported on 10/25/2021) 8 tablet 0   ondansetron (ZOFRAN ODT) 8 MG disintegrating tablet Take 1 tablet (8 mg total) by mouth every 8 (eight) hours as needed for nausea or vomiting. (Patient not taking: Reported on 10/25/2021) 20 tablet 0   pantoprazole (PROTONIX) 40 MG tablet Take 1 tablet (40 mg total) by mouth 2 (two) times daily. (Patient not taking: Reported on 10/25/2021) 60 tablet 1   polyethylene glycol (MIRALAX / GLYCOLAX) 17 g packet Take 17 g by mouth daily. (Patient not taking: No sig reported) 14 each 0   Prenatal Vit-Fe Fumarate-FA (PREPLUS) 27-1 MG TABS Take 1 tablet by mouth daily. (Patient not taking: Reported on 10/25/2021) 30 tablet 13   scopolamine (TRANSDERM-SCOP) 1 MG/3DAYS Place 1 patch (1.5 mg total) onto the skin every 3 (three) days. (Patient not taking: Reported on 10/25/2021) 10 patch 12   sodium phosphate (FLEET) 7-19 GM/118ML ENEM Place 133 mLs (1 enema total) rectally daily as needed for severe constipation. (Patient not taking: Reported on 10/25/2021) 133 mL 5    Objective: BP 129/87   Pulse 61   Temp 98.2 F (36.8 C) (Oral)   Resp 19   SpO2 100%   Exam: General: Tired appearing, resting in bed, Nontoxic Eyes: Drooping eyelids, Equal and reactive to light, EOMI ENTM: Clear nasal drainage present, dry mucous membranes/tongue Cardiovascular: regular rate and rhythm, no m/r/g 2+ pulses in upper and lower extremity, cap refill 2-3 seconds Respiratory: CTAB no w/r/c, saturating well on room air Gastrointestinal: Soft, non-distended, abdomen was soft, hypoactive bowel sounds and had some tenderness to palpation in LUQ and RUQ and RLQ but when palpating again complained of no tenderness MSK: Normal tone, no LE edema, moves freely Derm: No rashes visualized Neuro: CN II: PERRL CN III, IV,VI: EOMI CV V: Normal sensation in V1, V2, V3 CVII: Symmetric facial muscles CN VIII: Normal  hearing CN XI: 5/5 shoulder shrug CN XII: Symmetric tongue protrusion  Moves upper and lower extremity Unable to assess strength due to patient sedation Normal sensation in UE and LE bilaterally  Psych: Drowsy  Labs and Imaging: CBC BMET  Recent Labs  Lab 11/15/21 0841  WBC 10.1  HGB 13.4  HCT 38.8  PLT 185   Recent Labs  Lab 11/15/21 0841  NA 135  K 4.1  CL 103  CO2 23  BUN 11  CREATININE 0.91  GLUCOSE 137*  CALCIUM 9.4     EKG: EKG showed sinus rhythm with QTc 392.   No results found.   Gerrit Heck, MD 11/15/2021, 2:16 PM PGY-1, [place] Intern pager: [phone], text pages welcome   FPTS Upper-Level Resident Addendum   I have independently interviewed  and examined the patient. I have discussed the above with the original author and agree with their documentation. My edits for correction/addition/clarification are in within the document. Please see also any attending notes.   Lyndee Hensen, DO PGY-3, [facility] 11/15/2021 8:11 PM  FPTS Service pager: [phone] (text pages welcome through [name])

## 2021-11-15 NOTE — Progress Notes (Signed)
FPTS Brief Progress Note  S: Patient sleeping comfortably in bed during rounds.  Around midnight, discussion with nurse occurred due to recorded fever.  Nurse noted that patient was covered in many blankets and upon removal and temperature recheck was afebrile.  She does note another emesis episode.  O: BP (!) 143/78 (BP Location: Left Arm)   Pulse 68   Temp 98.8 F (37.1 C) (Axillary)   Resp 18   SpO2 98%   General: Well-appearing, comfortable in bed, NAD  A/P: Intractable vomiting Patient slept well for 2 to 3 hours per nurse.  Awoke with an episode of emesis and received Zofran.  Will continue to monitor and treat as appropriate.  Continue plan per day team otherwise.  All vitals stable at this time. -Continue Zofran every 6 hours as needed -Added scopolamine patch every 72 hours -Consider Reglan should vomiting continue to be uncontrolled  - Orders reviewed. Labs for AM ordered, which was adjusted as needed.   Shelby Mattocks, DO 11/15/2021, 11:59 PM PGY-1, [facility] Night Resident  Please page [phone] with questions.

## 2021-11-15 NOTE — ED Triage Notes (Addendum)
Pt to triage via [facility] from home.  Reports generalized abd pain, nausea, vomiting, and diarrhea since last night.  Seen 3 weeks ago for coffee ground emesis.  CBG 124

## 2021-11-16 ENCOUNTER — Encounter (HOSPITAL_COMMUNITY): Payer: Self-pay | Admitting: Student

## 2021-11-16 ENCOUNTER — Other Ambulatory Visit (HOSPITAL_COMMUNITY): Payer: Self-pay

## 2021-11-16 DIAGNOSIS — R1110 Vomiting, unspecified: Secondary | ICD-10-CM | POA: Diagnosis not present

## 2021-11-16 DIAGNOSIS — I10 Essential (primary) hypertension: Secondary | ICD-10-CM | POA: Diagnosis not present

## 2021-11-16 DIAGNOSIS — J45909 Unspecified asthma, uncomplicated: Secondary | ICD-10-CM | POA: Diagnosis not present

## 2021-11-16 DIAGNOSIS — Z20822 Contact with and (suspected) exposure to covid-19: Secondary | ICD-10-CM | POA: Diagnosis not present

## 2021-11-16 DIAGNOSIS — J09X9 Influenza due to identified novel influenza A virus with other manifestations: Secondary | ICD-10-CM | POA: Diagnosis not present

## 2021-11-16 LAB — CBC
HCT: 34.7 % — ABNORMAL LOW (ref 36.0–46.0)
Hemoglobin: 12 g/dL (ref 12.0–15.0)
MCH: 29.9 pg (ref 26.0–34.0)
MCHC: 34.6 g/dL (ref 30.0–36.0)
MCV: 86.3 fL (ref 80.0–100.0)
Platelets: 135 10*3/uL — ABNORMAL LOW (ref 150–400)
RBC: 4.02 MIL/uL (ref 3.87–5.11)
RDW: 14.3 % (ref 11.5–15.5)
WBC: 8.7 10*3/uL (ref 4.0–10.5)
nRBC: 0 % (ref 0.0–0.2)

## 2021-11-16 LAB — COMPREHENSIVE METABOLIC PANEL
ALT: 19 U/L (ref 0–44)
AST: 21 U/L (ref 15–41)
Albumin: 3.5 g/dL (ref 3.5–5.0)
Alkaline Phosphatase: 35 U/L — ABNORMAL LOW (ref 38–126)
Anion gap: 7 (ref 5–15)
BUN: 9 mg/dL (ref 6–20)
CO2: 25 mmol/L (ref 22–32)
Calcium: 8.3 mg/dL — ABNORMAL LOW (ref 8.9–10.3)
Chloride: 102 mmol/L (ref 98–111)
Creatinine, Ser: 0.93 mg/dL (ref 0.44–1.00)
GFR, Estimated: >60 mL/min (ref >60)
Glucose, Bld: 96 mg/dL (ref 70–99)
Potassium: 3.2 mmol/L — ABNORMAL LOW (ref 3.5–5.1)
Sodium: 134 mmol/L — ABNORMAL LOW (ref 135–145)
Total Bilirubin: 0.5 mg/dL (ref 0.3–1.2)
Total Protein: 6.4 g/dL — ABNORMAL LOW (ref 6.5–8.1)

## 2021-11-16 LAB — HIV ANTIBODY (ROUTINE TESTING W REFLEX): HIV Screen 4th Generation wRfx: NONREACTIVE

## 2021-11-16 MED ORDER — SCOPOLAMINE 1 MG/3DAYS TD PT72
1.0000 | MEDICATED_PATCH | TRANSDERMAL | 0 refills | Status: AC
  Filled 2021-11-16: qty 10, 30d supply, fill #0

## 2021-11-16 MED ORDER — ONDANSETRON 4 MG PO TBDP
ORAL_TABLET | ORAL | 0 refills | Status: AC
  Filled 2021-11-16: qty 15, 3d supply, fill #0

## 2021-11-16 MED ORDER — POTASSIUM CHLORIDE 10 MEQ/100ML IV SOLN
10.0000 meq | INTRAVENOUS | Status: AC
  Administered 2021-11-16 (×4): 10 meq via INTRAVENOUS
  Filled 2021-11-16 (×4): qty 100

## 2021-11-16 MED ORDER — SCOPOLAMINE 1 MG/3DAYS TD PT72
1.0000 | MEDICATED_PATCH | TRANSDERMAL | Status: DC
  Administered 2021-11-16: 02:00:00 1.5 mg via TRANSDERMAL
  Filled 2021-11-16: qty 1

## 2021-11-16 MED ORDER — CAPSAICIN 0.025 % EX CREA
TOPICAL_CREAM | Freq: Two times a day (BID) | CUTANEOUS | 0 refills | Status: AC
  Filled 2021-11-16: qty 60, 28d supply, fill #0

## 2021-11-16 MED ORDER — OSELTAMIVIR PHOSPHATE 75 MG PO CAPS
75.0000 mg | ORAL_CAPSULE | Freq: Two times a day (BID) | ORAL | 0 refills | Status: AC
  Filled 2021-11-16: qty 9, 5d supply, fill #0

## 2021-11-16 NOTE — Discharge Summary (Addendum)
Family Medicine Teaching [facility] Discharge Summary  Patient name: Julie Coleman Medical record number: [id number] Date of birth: 02/18/1991 Age: 30 y.o. Gender: female Date of Admission: 11/15/2021  Date of Discharge: 11/16/2021 Admitting Physician: Levin Erp, MD  Primary Care Provider: Primary Care at [facility] Consultants: None  Indication for Hospitalization: Intractable vomiting  Discharge Diagnoses/Problem List:  Intractable vomiting Influenza A Bradycardia  Disposition: Home   Discharge Condition: Stable  Discharge Exam:   General: Well appearing, NAD, awake, alert, responsive to questions Head: Normocephalic atraumatic CV: Regular rate and rhythm no murmurs rubs or gallops Respiratory: Clear to ausculation bilaterally, no wheezes rales or crackles, chest rises symmetrically,  no increased work of breathing Abdomen: Soft, mildly tender to palpation, non-distended, normoactive bowel sounds  Extremities: Moves upper and lower extremities freely, no edema in LE Neuro: No focal deficits Skin: No rashes or lesions visualized   Brief Hospital Course:  Julie Coleman is a 30 y.o.female with a history of cannabis hyperemesis syndrome, HTN who was admitted to the [facility] Teaching Service at [facility] for intractable vomiting. Her hospital course is detailed below:  Intractable vomiting Patient presented to the ED with a history of vomiting that started last night.  BMP and CBC  was non-remarkable.  Lipase normal at 35. hCG <5.  Influenza A positive. Patient was given 2x 4mg  zofran injections and 1x droperidol 2.5 mg injection without resolutions of symptoms. She was also given 3x 1L NS boluses in ED. On exam, patient was extremely lethargic with emesis bag containing yellow-green fluid in hand. Abdomen was soft, hypoactive bowel sounds and had some tenderness to palpation. Mucous membranes moderately dry.  Believed likely recurrent emesis from known cannabis  hyperemesis syndrome, patient's last use was 2 days before hospitalization exacerbated by influenza A infection. Was started on NS mIVF, zofran as needed, scopolamine patch and capsaicin cream. In the morning, she was feeling much better and IV fluids were discontinued. She was tolerating PO intake well and ate lunch without issue or recurrent emesis.  Influenza A Incidentally found to be influenza positive. No focal lung findings, likely contributing to emesis and "feeling sick". Pt to complete Oseltamivir treatment outpatient.   Bradycardia  HR 40s-50s initially in ED. EKG showed sinus rhythm with QTc 392. Not symptomatic and was noted on previous hospitalizations.  Home medications were continued for other chronic diseases which remained stable.      Issues for Follow Up:  Left ear pain: PCP to follow up.  Dysuria: PCP to follow up. Recommend STI testing for completeness. (UA negative for infection) Cannabis hyperemesis: Follow up on emesis, nausea, cannabis use. Recommend cessation.  Drug use: UDS positive for amphetamines and cannabis. Discuss cessation and prevention techniques.  Hypokalemia: Likely due to vomiting, was repleted. Recommend BMP at follow up.   Significant Procedures: None  Significant Labs and Imaging:  Recent Labs  Lab 11/15/21 0841 11/16/21 0111  WBC 10.1 8.7  HGB 13.4 12.0  HCT 38.8 34.7*  PLT 185 135*   Recent Labs  Lab 11/15/21 0841 11/16/21 0111  NA 135 134*  K 4.1 3.2*  CL 103 102  CO2 23 25  GLUCOSE 137* 96  BUN 11 9  CREATININE 0.91 0.93  CALCIUM 9.4 8.3*  ALKPHOS 47 35*  AST 25 21  ALT 21 19  ALBUMIN 4.2 3.5      Results/Tests Pending at Time of Discharge:   Discharge Medications:  Allergies as of 11/16/2021  Reactions   Phenergan [promethazine Hcl]    Tardive dyskinesia         Medication List     STOP taking these medications    acetaminophen 325 MG tablet Commonly known as: Tylenol   amLODipine 2.5 MG  tablet Commonly known as: NORVASC   diphenhydrAMINE 25 mg capsule Commonly known as: BENADRYL   docusate sodium 100 MG capsule Commonly known as: COLACE   ferrous sulfate 325 (65 FE) MG tablet   glycopyrrolate 1 MG tablet Commonly known as: ROBINUL   naproxen 375 MG tablet Commonly known as: NAPROSYN   pantoprazole 40 MG tablet Commonly known as: Protonix   polyethylene glycol 17 g packet Commonly known as: MIRALAX / GLYCOLAX   PrePLUS 27-1 MG Tabs   sodium phosphate 7-19 GM/118ML Enem       TAKE these medications    capsaicin 0.025 % cream Commonly known as: ZOSTRIX Apply topically 2 (two) times daily.   ondansetron 4 MG disintegrating tablet Commonly known as: ZOFRAN-ODT Dissolve one tablet under the tongue every 4 hours as needed for nausea. What changed:  additional instructions Another medication with the same name was removed. Continue taking this medication, and follow the directions you see here.   oseltamivir 75 MG capsule Commonly known as: TAMIFLU Take 1 capsule (75 mg total) by mouth every 12 (twelve) hours.   oseltamivir 75 MG capsule Commonly known as: TAMIFLU Take 1 capsule (75 mg total) by mouth 2 (two) times daily.   scopolamine 1 MG/3DAYS Commonly known as: TRANSDERM-SCOP Place 1 patch (1.5 mg total) onto the skin every 3 (three) days. Start taking on: November 19, 2021        Discharge Instructions: Please refer to Patient Instructions section of EMR for full details.  Patient was counseled important signs and symptoms that should prompt return to medical care, changes in medications, dietary instructions, activity restrictions, and follow up appointments.   Follow-Up Appointments:  Follow-up Information     [facility] Follow up on 01/06/2022.   Why: Your appointment is at 9 am. Please arrive early and bring a picture ID, insurance card and current medications. Contact information: [address], [address]  [address] [place] [address]        Transportation through [place] Follow up.   Contact information: Department of Social services [phone] option 1                Levin Erp, MD 11/16/2021, 4:28 PM   Katha Cabal, DO

## 2021-11-16 NOTE — Progress Notes (Signed)
Mobility Specialist Progress Note:   11/16/21 1020  Mobility  Activity Ambulated in hall  Level of Assistance Independent  Assistive Device None  Distance Ambulated (ft) 300 ft  Mobility Ambulated independently in hallway  Mobility Response Tolerated well  Mobility performed by Mobility specialist  $Mobility charge 1 Mobility   Pt asx during ambulation. C/o feeling significantly better today. Back in bed with all needs met.   Nelta Numbers Mobility Specialist  Phone [phone]

## 2021-11-16 NOTE — Discharge Instructions (Addendum)
Dear Julie Coleman,  Thank you for letting us participate in your care. You were hospitalized for nausea and vomiting and diagnosed with influenza A and cannabis hyperemesis syndrome.  POST-HOSPITAL & CARE INSTRUCTIONS You need a primary care doctor to follow up with after leaving the hospital.  Stop using cannabis. Several weeks after cessation, your cannabis hyperemesis syndrome (nausea and vomiting from weed) will improve.  Your urine drug screen was positive for cannabis and amphetamines. We recommend stopping these drugs.  Go to your follow up appointments (listed below)   DOCTOR'S APPOINTMENT   No future appointments.  Please return to care if: You are having vomiting that does not stop with medications You are having confusion, severe headaches or vision changes You are having large amounts of blood in your vomit  Take care and be well!  Family Medicine Teaching Service Inpatient Team [place]  [facility]  [address] [place], [place] [address] [phone]

## 2021-11-16 NOTE — Plan of Care (Signed)

## 2021-11-16 NOTE — Hospital Course (Addendum)
Julie Coleman is a 30 y.o.female with a history of cannabis hyperemesis syndrome, HTN who was admitted to the [facility] Teaching Service at [facility] for intractable vomiting. Her hospital course is detailed below:  Intractable vomiting Patient presented to the ED with a history of vomiting that started last night.  BMP and CBC  was non-remarkable.  Lipase normal at 35. hCG <5.  Influenza A positive. Patient was given 2x 4mg  zofran injections and 1x droperidol 2.5 mg injection without resolutions of symptoms. She was also given 3x 1L NS boluses in ED. On exam patient was extremely lethargic with emesis bag containing green fluid in hand. Abdomen was soft, hypoactive bowel sounds and had some tenderness to palpation. Mucous membranes moderately dry.  Believed likely recurrent emesis from known cannabis hyperemesis syndrome, patient's last use was 2 days before hospitalization exacerbated by influenza A infection. Was started on NS mIVF, zofran as needed, scopolamine patch and capsaicin cream. In the morning she was feeling better and was removed off IVF. She was tolerating PO intake well and ate lunch without issue or recurrent emesis.  Influenza A Incidentally found to be influenza positive. No focal lung findings, likely contributing to emesis and "feeling sick" -Oseltamivir 75 mg BID Day 1/5  Bradycardia  HR 40s-50s initially in ED. EKG showed sinus rhythm with QTc 392. Not symptomatic and was noted on previous hospitalizations.  Chronic conditions stable

## 2021-11-16 NOTE — Progress Notes (Signed)
Family Medicine Teaching Service Daily Progress Note Intern Pager: [phone]  Patient name: Julie Coleman Medical record number: [id number] Date of birth: September 14, 1991 Age: 30 y.o. Gender: female  Primary Care Provider: Pcp, No Consultants: None Code Status: Full code  Pt Overview and Major Events to Date:  11/27 admitted  Assessment and Plan:  Julie Coleman is a 30 year old female who presented with intractable vomiting and flu a positive.  Past medical history significant for cannabis hyperemesis syndrome, high risk pregnancy last year with IUGR and gestational hypertension.  Intractable vomiting 7 recorded emesis events total from yesterday. She feels better than before this morning but still is unable to hold down any food. PO not charted but she was drinking water when in room today.  -Zofran every 6 hours as needed -Scopolamine added last night every 72 hours -capsaicin cream for abdominal pain -NS mIVF  Influenza A Had temperature of 101.1 last night.  No Tylenol was given per MAR. AST ALT normal. WBC 8.7.  -Oseltamivir 25 mg twice daily Day 1-2/5  Hypokalemia Potassium 3.2 this morning.  Repleted with 4 runs of 10 mEq IV potassium -Monitor K  Left Ear Pain Says she has some pain and ringing in her left ear this morning. When pulling ear patient said it was painful.  -No otoscope on floor can recheck/outpatient workup  Dysuria Says been having burning with urination since being in hospital. UA negative for infection. -Monitor -Consider STI testing outpatient  Muscle aching Feels her legs and abdomen are aching. Likely 2/2 to retching and electrolyte imbalances could play part as well. -Capsaicin for abdominal muscle tenderness  Bradycardia, improving Had single pulse of 53 this morning but overall has been greatly improved. Seems to have been present on other hospital stays. UDS positive for amphetamine and marijuana. -cardiac monitoring  Lethargy, improved A&Ox4.  Able to stay awake and respond quickly during conversation. Feels overall tired but improved from yesterday. -Monitor  Stable problems History of gHTN- mostly normotensive Cannabis use disorder-encourage cessation Hyperglycemia-96 today  FEN/GI: regular diet PPx: lovenox Dispo:Home soon pending clinical improvement  Subjective:  Complains of left ear pain and ringing and feels like her vomiting is better. Denies any constipation/diarrhea  Objective: Temp:  [98.8 F (37.1 C)-101.1 F (38.4 C)] 100.2 F (37.9 C) (11/28 0900) Pulse Rate:  [47-99] 88 (11/28 0903) Resp:  [17-20] 18 (11/28 0903) BP: (110-147)/(58-95) 146/83 (11/28 0903) SpO2:  [92 %-100 %] 100 % (11/28 0903) Weight:  [75 kg] 75 kg (11/27 2239) Physical Exam: General: NAD, laying in bed comfortably Cardiovascular: RRR no m/r/g Respiratory: CTAB no w/r/c, cough present Abdomen: Soft, mildly tender to palpation, BS+ Extremities: Mildly tender to palpation in LE, no swelling or erythema  Laboratory: Recent Labs  Lab 11/15/21 0841 11/16/21 0111  WBC 10.1 8.7  HGB 13.4 12.0  HCT 38.8 34.7*  PLT 185 135*    Recent Labs  Lab 11/15/21 0841 11/16/21 0111  NA 135 134*  K 4.1 3.2*  CL 103 102  CO2 23 25  BUN 11 9  CREATININE 0.91 0.93  CALCIUM 9.4 8.3*  PROT 7.6 6.4*  BILITOT 0.6 0.5  ALKPHOS 47 35*  ALT 21 19  AST 25 21  GLUCOSE 137* 96     Imaging/Diagnostic Tests:   Levin Erp, MD 11/16/2021, 9:34 AM PGY-1,  Family Medicine FPTS Intern pager: [phone], text pages welcome

## 2021-11-16 NOTE — Progress Notes (Signed)
Pt discharged home this evening in stable condition. Taxi voucher provided by care management

## 2021-11-16 NOTE — TOC CAGE-AID Note (Signed)
Transition of Care [facility]) - CAGE-AID Screening   Patient Details  Name: RYLIE LIMBURG MRN: [id number] Date of Birth: [birth year]  Transition of Care [facility]) CM/SW Contact:    Kermit Balo, RN Phone Number: 11/16/2021, 3:51 PM   Clinical Narrative: Patient accepting of inpatient/ outpatient drug counseling resources.   CAGE-AID Screening:    Have You Ever Felt You Ought to Cut Down on Your Drinking or Drug Use?: Yes Have People Annoyed You By Critizing Your Drinking Or Drug Use?: Yes Have You Felt Bad Or Guilty About Your Drinking Or Drug Use?: Yes Have You Ever Had a Drink or Used Drugs First Thing In The Morning to Steady Your Nerves or to Get Rid of a Hangover?: No CAGE-AID Score: 3  Substance Abuse Education Offered: Yes  Substance abuse interventions: Patient Counseling

## 2021-11-16 NOTE — TOC Transition Note (Signed)
Transition of Care [facility]) - CM/SW Discharge Note   Patient Details  Name: Julie Coleman MRN: [id number] Date of Birth: [birth year]  Transition of Care [facility]) CM/SW Contact:  Kermit Balo, RN Phone Number: 11/16/2021, 4:22 PM   Clinical Narrative:    Patient discharging home with self care.  Pt is without a PcP. CM was able to get an appointment at Primary Care at [facility]. Information on the AVS. Pt is without transport home. CM will provide bedside RN with cab voucher for transport.    Final next level of care: Home/Self Care Barriers to Discharge: No Barriers Identified   Patient Goals and CMS Choice        Discharge Placement                       Discharge Plan and Services                                     Social Determinants of Health (SDOH) Interventions     Readmission Risk Interventions No flowsheet data found.

## 2021-11-23 ENCOUNTER — Emergency Department (HOSPITAL_COMMUNITY)
Admission: EM | Admit: 2021-11-23 | Discharge: 2021-11-24 | Disposition: A | Discharge status: Home / Self Care | Payer: Medicaid Other | Attending: Emergency Medicine | Admitting: Emergency Medicine

## 2021-11-23 ENCOUNTER — Emergency Department (HOSPITAL_COMMUNITY): Payer: Medicaid Other

## 2021-11-23 ENCOUNTER — Encounter (HOSPITAL_COMMUNITY): Payer: Self-pay | Admitting: Emergency Medicine

## 2021-11-23 ENCOUNTER — Other Ambulatory Visit: Payer: Self-pay

## 2021-11-23 DIAGNOSIS — I10 Essential (primary) hypertension: Secondary | ICD-10-CM | POA: Diagnosis not present

## 2021-11-23 DIAGNOSIS — J189 Pneumonia, unspecified organism: Secondary | ICD-10-CM | POA: Diagnosis not present

## 2021-11-23 DIAGNOSIS — Z20822 Contact with and (suspected) exposure to covid-19: Secondary | ICD-10-CM | POA: Insufficient documentation

## 2021-11-23 DIAGNOSIS — Z8616 Personal history of COVID-19: Secondary | ICD-10-CM | POA: Insufficient documentation

## 2021-11-23 DIAGNOSIS — J101 Influenza due to other identified influenza virus with other respiratory manifestations: Secondary | ICD-10-CM | POA: Diagnosis not present

## 2021-11-23 DIAGNOSIS — J45909 Unspecified asthma, uncomplicated: Secondary | ICD-10-CM | POA: Diagnosis not present

## 2021-11-23 DIAGNOSIS — R112 Nausea with vomiting, unspecified: Secondary | ICD-10-CM

## 2021-11-23 DIAGNOSIS — E876 Hypokalemia: Secondary | ICD-10-CM

## 2021-11-23 DIAGNOSIS — R059 Cough, unspecified: Secondary | ICD-10-CM | POA: Diagnosis present

## 2021-11-23 LAB — LIPASE, BLOOD: Lipase: 27 U/L (ref 11–51)

## 2021-11-23 LAB — CBC
HCT: 33.7 % — ABNORMAL LOW (ref 36.0–46.0)
Hemoglobin: 11.6 g/dL — ABNORMAL LOW (ref 12.0–15.0)
MCH: 29.6 pg (ref 26.0–34.0)
MCHC: 34.4 g/dL (ref 30.0–36.0)
MCV: 86 fL (ref 80.0–100.0)
Platelets: 228 10*3/uL (ref 150–400)
RBC: 3.92 MIL/uL (ref 3.87–5.11)
RDW: 14 % (ref 11.5–15.5)
WBC: 8.4 10*3/uL (ref 4.0–10.5)
nRBC: 0 % (ref 0.0–0.2)

## 2021-11-23 LAB — COMPREHENSIVE METABOLIC PANEL
ALT: 51 U/L — ABNORMAL HIGH (ref 0–44)
AST: 33 U/L (ref 15–41)
Albumin: 3.5 g/dL (ref 3.5–5.0)
Alkaline Phosphatase: 45 U/L (ref 38–126)
Anion gap: 11 (ref 5–15)
BUN: 6 mg/dL (ref 6–20)
CO2: 24 mmol/L (ref 22–32)
Calcium: 8.9 mg/dL (ref 8.9–10.3)
Chloride: 101 mmol/L (ref 98–111)
Creatinine, Ser: 0.89 mg/dL (ref 0.44–1.00)
GFR, Estimated: >60 mL/min (ref >60)
Glucose, Bld: 104 mg/dL — ABNORMAL HIGH (ref 70–99)
Potassium: 2.7 mmol/L — CL (ref 3.5–5.1)
Sodium: 136 mmol/L (ref 135–145)
Total Bilirubin: 0.8 mg/dL (ref 0.3–1.2)
Total Protein: 7 g/dL (ref 6.5–8.1)

## 2021-11-23 LAB — I-STAT BETA HCG BLOOD, ED (MC, WL, AP ONLY): I-stat hCG, quantitative: <5 m[IU]/mL (ref <5)

## 2021-11-23 MED ORDER — ONDANSETRON 4 MG PO TBDP
4.0000 mg | ORAL_TABLET | Freq: Once | ORAL | Status: AC | PRN
  Administered 2021-11-23: 4 mg via ORAL
  Filled 2021-11-23: qty 1

## 2021-11-23 NOTE — ED Triage Notes (Addendum)
Pt presents to ED BIB [facility]. Pt c/o n/v/d for a few weeks. Pt reports that she was flu+ last time she was here and took tamiflu but s/s have not relieved. Pt EMS pt ran out of zofran. EMS VSS.

## 2021-11-23 NOTE — ED Provider Notes (Signed)
Emergency Medicine Provider Triage Evaluation Note  Julie Coleman , a 30 y.o. female  was evaluated in triage.  Pt complains of persistently not feeling well x 2 weeks. Subjective fever, chills, congestion, cough, N/V/D, and abdominal discomfort. Reports abdominal pain is to the right side and has been there for months. Recent admission 1 week ago for same, states she feels she is getting worse.    Review of Systems  Positive: Fever ,chills, N/V/D, cough, and abdominal pain.  Negative: Chest pain, syncope, hematemesis, melena.   Physical Exam  BP 128/87 (BP Location: Left Arm)   Pulse 76   Temp 98.2 F (36.8 C) (Oral)   Resp 18   SpO2 99%  Gen:   Awake, no distress   Resp:  Normal effort  MSK:   Moves extremities without difficulty  Other:  No peritoneal signs on abdominal exam.   Medical Decision Making  Medically screening exam initiated at 10:40 PM.  Appropriate orders placed.  [name] was informed that the remainder of the evaluation will be completed by another provider, this initial triage assessment does not replace that evaluation, and the importance of remaining in the ED until their evaluation is complete.  N/V/D.    Desmond Lope 11/23/21 2253    Jacalyn Lefevre, MD 11/23/21 2326

## 2021-11-24 LAB — URINALYSIS, MICROSCOPIC (REFLEX): Bacteria, UA: NONE SEEN

## 2021-11-24 LAB — URINALYSIS, ROUTINE W REFLEX MICROSCOPIC
Bilirubin Urine: NEGATIVE
Glucose, UA: NEGATIVE mg/dL
Hgb urine dipstick: NEGATIVE
Ketones, ur: NEGATIVE mg/dL
Leukocytes,Ua: NEGATIVE
Nitrite: NEGATIVE
Protein, ur: 30 mg/dL — AB
Specific Gravity, Urine: >1.03 — ABNORMAL HIGH (ref 1.005–1.030)
pH: 6 (ref 5.0–8.0)

## 2021-11-24 LAB — RESP PANEL BY RT-PCR (FLU A&B, COVID) ARPGX2
Influenza A by PCR: NEGATIVE
Influenza B by PCR: NEGATIVE
SARS Coronavirus 2 by RT PCR: NEGATIVE

## 2021-11-24 LAB — MAGNESIUM: Magnesium: 2.3 mg/dL (ref 1.7–2.4)

## 2021-11-24 MED ORDER — POTASSIUM CHLORIDE CRYS ER 20 MEQ PO TBCR: 20.0000 meq | EXTENDED_RELEASE_TABLET | Freq: Two times a day (BID) | ORAL | 0 refills | Status: AC

## 2021-11-24 MED ORDER — POTASSIUM CHLORIDE 10 MEQ/100ML IV SOLN
10.0000 meq | Freq: Once | INTRAVENOUS | Status: AC
  Administered 2021-11-24: 10 meq via INTRAVENOUS
  Filled 2021-11-24: qty 100

## 2021-11-24 MED ORDER — POTASSIUM CHLORIDE CRYS ER 20 MEQ PO TBCR
40.0000 meq | EXTENDED_RELEASE_TABLET | Freq: Once | ORAL | Status: AC
  Administered 2021-11-24: 40 meq via ORAL
  Filled 2021-11-24: qty 2

## 2021-11-24 MED ORDER — AMOXICILLIN-POT CLAVULANATE 875-125 MG PO TABS: 1.0000 | ORAL_TABLET | Freq: Two times a day (BID) | ORAL | 0 refills | Status: DC

## 2021-11-24 MED ORDER — SODIUM CHLORIDE 0.9 % IV BOLUS
1000.0000 mL | Freq: Once | INTRAVENOUS | Status: AC
  Administered 2021-11-24: 1000 mL via INTRAVENOUS

## 2021-11-24 MED ORDER — AZITHROMYCIN 250 MG PO TABS: 250.0000 mg | ORAL_TABLET | Freq: Every day | ORAL | 0 refills | Status: AC

## 2021-11-24 NOTE — Discharge Instructions (Addendum)
You were seen today for nausea and vomiting cough. your potassium is low.  Take potassium for the next 5 days.  Continue Zofran for nausea and vomiting.  Abstain from marijuana use.  You will be treated for pneumonia.  Take antibiotics as prescribed.

## 2021-11-24 NOTE — ED Provider Notes (Signed)
[facility] EMERGENCY DEPARTMENT Provider Note   CSN: [id number] Arrival date & time: 11/23/21  2224     History Chief Complaint  Patient presents with   Nausea    Julie Coleman is a 30 y.o. female.  HPI     This is a 30 year old female with a history of asthma and hypertension who presents with persistent nausea, vomiting, diarrhea, and cough.  Patient was diagnosed with the flu on 11/27.  States she has not been able to keep anything down since that time.  She still feels poorly.  She states she took a course of Tamiflu with minimal relief.  She is using Zofran at home without relief.  She reports fever last night to 101.  She reports persistent cough productive of phlegm.  Of note, patient with history of hyperemesis related to cannabinoid use.  Denies ongoing use at this time.  Past Medical History:  Diagnosis Date   Asthma    albuteral inhaler   Decreased fetal movement 12/22/2019   Gestational diabetes    Hypertension     Patient Active Problem List   Diagnosis Date Noted   Intractable vomiting 11/15/2021   Influenza A    Bradycardia    Gestational hypertension 12/24/2019   VBAC, delivered 12/23/2019   COVID-19 affecting pregnancy in third trimester 12/21/2019   History of cesarean delivery 12/19/2019   History of decreased fetal movements 12/19/2019   Supervision of high risk pregnancy, antepartum 11/21/2019   Hyperemesis 11/20/2019   IUGR (intrauterine growth restriction) affecting care of mother 11/20/2019   Hyperemesis affecting pregnancy, antepartum 11/13/2019   Rubella non-immune status, antepartum 11/12/2019   Cannabis hyperemesis syndrome concurrent with and due to cannabis abuse (HCC) 11/09/2019   Nausea and vomiting in pregnancy 11/08/2019   Heartburn during pregnancy, antepartum 10/25/2019   Trichomonal vaginitis during pregnancy 10/22/2019   Anemia affecting pregnancy 10/22/2019    Past Surgical History:  Procedure Laterality  Date   CESAREAN SECTION     CHOLECYSTECTOMY       OB History     Gravida  6   Para  4   Term  3   Preterm  1   AB  2   Living  4      SAB  1   IAB  1   Ectopic      Multiple  0   Live Births  4           History reviewed. No pertinent family history.  Social History   Tobacco Use   Smoking status: Never   Smokeless tobacco: Never  Vaping Use   Vaping Use: Never used  Substance Use Topics   Alcohol use: Not Currently   Drug use: Yes    Types: Marijuana    Comment: last use yesterday 12/21/2019    Home Medications Prior to Admission medications   Medication Sig Start Date End Date Taking? Authorizing Provider  amoxicillin-clavulanate (AUGMENTIN) 875-125 MG tablet Take 1 tablet by mouth every 12 (twelve) hours. 11/24/21  Yes Ramsey Midgett, Mayer Masker, MD  azithromycin (ZITHROMAX) 250 MG tablet Take 1 tablet (250 mg total) by mouth daily. Take first 2 tablets together, then 1 every day until finished. 11/24/21  Yes Jeriann Sayres, Mayer Masker, MD  potassium chloride SA (KLOR-CON M) 20 MEQ tablet Take 1 tablet (20 mEq total) by mouth 2 (two) times daily. 11/24/21  Yes Juriel Cid, Mayer Masker, MD  capsaicin (ZOSTRIX) 0.025 % cream Apply topically 2 (two) times daily.  11/16/21   Levin Erp, MD  oseltamivir (TAMIFLU) 75 MG capsule Take 1 capsule (75 mg total) by mouth every 12 (twelve) hours. 11/15/21   Mannie Stabile, PA-C  oseltamivir (TAMIFLU) 75 MG capsule Take 1 capsule (75 mg total) by mouth 2 (two) times daily. 11/16/21   Levin Erp, MD  scopolamine (TRANSDERM-SCOP) 1 MG/3DAYS Place 1 patch (1.5 mg total) onto the skin every 3 (three) days. 11/19/21   Levin Erp, MD    Allergies    Phenergan [promethazine hcl]  Review of Systems   Review of Systems  Constitutional:  Positive for chills and fever.  HENT:  Positive for congestion.   Respiratory:  Positive for cough. Negative for shortness of breath.   Cardiovascular:  Negative for chest pain.   Gastrointestinal:  Positive for diarrhea, nausea and vomiting. Negative for abdominal pain.  All other systems reviewed and are negative.  Physical Exam Updated Vital Signs BP 115/65   Pulse 69   Temp 98.2 F (36.8 C) (Oral)   Resp (!) 23   SpO2 99%   Physical Exam Vitals and nursing note reviewed.  Constitutional:      Appearance: She is well-developed.  HENT:     Head: Normocephalic and atraumatic.     Nose: Nose normal.     Mouth/Throat:     Mouth: Mucous membranes are dry.  Eyes:     Pupils: Pupils are equal, round, and reactive to light.  Cardiovascular:     Rate and Rhythm: Normal rate and regular rhythm.     Heart sounds: Normal heart sounds.  Pulmonary:     Effort: Pulmonary effort is normal. No respiratory distress.     Breath sounds: No wheezing.  Abdominal:     General: Bowel sounds are normal.     Palpations: Abdomen is soft.     Tenderness: There is no guarding or rebound.  Musculoskeletal:     Cervical back: Neck supple.     Right lower leg: No edema.     Left lower leg: No edema.  Skin:    General: Skin is warm and dry.  Neurological:     Mental Status: She is alert and oriented to person, place, and time.  Psychiatric:        Mood and Affect: Mood normal.    ED Results / Procedures / Treatments   Labs (all labs ordered are listed, but only abnormal results are displayed) Labs Reviewed  COMPREHENSIVE METABOLIC PANEL - Abnormal; Notable for the following components:      Result Value   Potassium 2.7 (*)    Glucose, Bld 104 (*)    ALT 51 (*)    All other components within normal limits  CBC - Abnormal; Notable for the following components:   Hemoglobin 11.6 (*)    HCT 33.7 (*)    All other components within normal limits  URINALYSIS, ROUTINE W REFLEX MICROSCOPIC - Abnormal; Notable for the following components:   Specific Gravity, Urine >1.030 (*)    Protein, ur 30 (*)    All other components within normal limits  RESP PANEL BY RT-PCR (FLU  A&B, COVID) ARPGX2  LIPASE, BLOOD  MAGNESIUM  URINALYSIS, MICROSCOPIC (REFLEX)  I-STAT BETA HCG BLOOD, ED ([facility], [facility], [facility] ONLY)    EKG None  Radiology DG Chest 2 View  Result Date: 11/23/2021 CLINICAL DATA:  Cough. EXAM: CHEST - 2 VIEW COMPARISON:  None. FINDINGS: Bibasilar streaky densities may represent atelectasis or infiltrate. Clinical correlation is recommended no  pleural effusion pneumothorax. The cardiac silhouette is within normal limits. No acute osseous pathology. IMPRESSION: Bibasilar atelectasis versus infiltrate. Electronically Signed   By: Elgie Collard M.D.   On: 11/23/2021 23:15    Procedures .Critical Care Performed by: Shon Baton, MD Authorized by: Shon Baton, MD   Critical care provider statement:    Critical care time (minutes):  30   Critical care was necessary to treat or prevent imminent or life-threatening deterioration of the following conditions:  Metabolic crisis and dehydration   Critical care was time spent personally by me on the following activities:  Development of treatment plan with patient or surrogate, discussions with consultants, evaluation of patient's response to treatment, examination of patient, ordering and review of laboratory studies, ordering and review of radiographic studies, ordering and performing treatments and interventions, pulse oximetry, re-evaluation of patient's condition and review of old charts   Medications Ordered in ED Medications  ondansetron (ZOFRAN-ODT) disintegrating tablet 4 mg (4 mg Oral Given 11/23/21 2252)  potassium chloride 10 mEq in 100 mL IVPB (0 mEq Intravenous Stopped 11/24/21 0221)  potassium chloride SA (KLOR-CON M) CR tablet 40 mEq (40 mEq Oral Given 11/24/21 0118)  sodium chloride 0.9 % bolus 1,000 mL (0 mLs Intravenous Stopped 11/24/21 0221)    ED Course  I have reviewed the triage vital signs and the nursing notes.  Pertinent labs & imaging results that were available during my care of  the patient were reviewed by me and considered in my medical decision making (see chart for details).    MDM Rules/Calculators/A&P                           Patient presents with ongoing nausea, vomiting, cough, fevers.  She is nontoxic and afebrile here.  Denies any ongoing marijuana use.  Was flu positive at last visit and has ongoing symptoms.  Finished Tamiflu.  She is in no respiratory distress.  Abdomen is soft and benign.  Highly suspect ongoing symptoms related to recent flu.  Labs reviewed.  Most notable for potassium of 2.7.  This was replaced orally and IV.  She was given fluids and Zofran.  She was able to tolerate apple juice without difficulty.  Her chest x-ray is concerning for potential bilateral streaky opacities.  Given ongoing fevers and cough, would favor treating for pneumonia.  Discussed this with the patient.  Feel she is a candidate for outpatient antibiotics at this time.  We will discharge with antibiotics and oral potassium supplementation.  After history, exam, and medical workup I feel the patient has been appropriately medically screened and is safe for discharge home. Pertinent diagnoses were discussed with the patient. Patient was given return precautions.  Final Clinical Impression(s) / ED Diagnoses Final diagnoses:  Nausea and vomiting, unspecified vomiting type  Hypokalemia  Community acquired pneumonia, unspecified laterality    Rx / DC Orders ED Discharge Orders          Ordered    potassium chloride SA (KLOR-CON M) 20 MEQ tablet  2 times daily        11/24/21 0233    azithromycin (ZITHROMAX) 250 MG tablet  Daily        11/24/21 0234    amoxicillin-clavulanate (AUGMENTIN) 875-125 MG tablet  Every 12 hours        11/24/21 0234             Shon Baton, MD 11/24/21 [phone]

## 2021-11-24 NOTE — ED Notes (Signed)
Pt given crackers and juice and educated that if she begins to feel nauseous or vomit after eating them to let the nursing staff know

## 2021-11-24 NOTE — ED Notes (Signed)
Pt was able to keep apple juice down without vomiting and refused to try the crackers

## 2021-12-23 ENCOUNTER — Emergency Department (HOSPITAL_COMMUNITY)
Admission: EM | Admit: 2021-12-23 | Discharge: 2021-12-23 | Discharge status: Against Medical Advice | Payer: Medicaid Other | Attending: Physician Assistant | Admitting: Physician Assistant

## 2021-12-23 ENCOUNTER — Emergency Department (HOSPITAL_COMMUNITY): Payer: Medicaid Other

## 2021-12-23 ENCOUNTER — Encounter (HOSPITAL_COMMUNITY): Payer: Self-pay | Admitting: Emergency Medicine

## 2021-12-23 ENCOUNTER — Other Ambulatory Visit: Payer: Self-pay

## 2021-12-23 DIAGNOSIS — Z5321 Procedure and treatment not carried out due to patient leaving prior to being seen by health care provider: Secondary | ICD-10-CM | POA: Insufficient documentation

## 2021-12-23 DIAGNOSIS — R079 Chest pain, unspecified: Secondary | ICD-10-CM | POA: Insufficient documentation

## 2021-12-23 DIAGNOSIS — R0602 Shortness of breath: Secondary | ICD-10-CM | POA: Diagnosis not present

## 2021-12-23 LAB — CBC WITH DIFFERENTIAL/PLATELET
Abs Immature Granulocytes: 0.01 10*3/uL (ref 0.00–0.07)
Basophils Absolute: 0.1 10*3/uL (ref 0.0–0.1)
Basophils Relative: 1 %
Eosinophils Absolute: 0.2 10*3/uL (ref 0.0–0.5)
Eosinophils Relative: 3 %
HCT: 36.8 % (ref 36.0–46.0)
Hemoglobin: 12.5 g/dL (ref 12.0–15.0)
Immature Granulocytes: 0 %
Lymphocytes Relative: 40 %
Lymphs Abs: 3.1 10*3/uL (ref 0.7–4.0)
MCH: 30 pg (ref 26.0–34.0)
MCHC: 34 g/dL (ref 30.0–36.0)
MCV: 88.5 fL (ref 80.0–100.0)
Monocytes Absolute: 0.4 10*3/uL (ref 0.1–1.0)
Monocytes Relative: 5 %
Neutro Abs: 4 10*3/uL (ref 1.7–7.7)
Neutrophils Relative %: 51 %
Platelets: 218 10*3/uL (ref 150–400)
RBC: 4.16 MIL/uL (ref 3.87–5.11)
RDW: 14.8 % (ref 11.5–15.5)
WBC: 7.7 10*3/uL (ref 4.0–10.5)
nRBC: 0 % (ref 0.0–0.2)

## 2021-12-23 LAB — COMPREHENSIVE METABOLIC PANEL
ALT: 12 U/L (ref 0–44)
AST: 18 U/L (ref 15–41)
Albumin: 3.7 g/dL (ref 3.5–5.0)
Alkaline Phosphatase: 43 U/L (ref 38–126)
Anion gap: 6 (ref 5–15)
BUN: 10 mg/dL (ref 6–20)
CO2: 26 mmol/L (ref 22–32)
Calcium: 9 mg/dL (ref 8.9–10.3)
Chloride: 108 mmol/L (ref 98–111)
Creatinine, Ser: 0.92 mg/dL (ref 0.44–1.00)
GFR, Estimated: >60 mL/min (ref >60)
Glucose, Bld: 88 mg/dL (ref 70–99)
Potassium: 4.1 mmol/L (ref 3.5–5.1)
Sodium: 140 mmol/L (ref 135–145)
Total Bilirubin: 0.3 mg/dL (ref 0.3–1.2)
Total Protein: 6.7 g/dL (ref 6.5–8.1)

## 2021-12-23 LAB — I-STAT BETA HCG BLOOD, ED (MC, WL, AP ONLY): I-stat hCG, quantitative: <5 m[IU]/mL (ref <5)

## 2021-12-23 LAB — LIPASE, BLOOD: Lipase: 31 U/L (ref 11–51)

## 2021-12-23 MED ORDER — AEROCHAMBER PLUS FLO-VU MEDIUM MISC
1.0000 | Freq: Once | Status: AC
  Administered 2021-12-23: 1

## 2021-12-23 MED ORDER — ALBUTEROL SULFATE HFA 108 (90 BASE) MCG/ACT IN AERS
2.0000 | INHALATION_SPRAY | Freq: Once | RESPIRATORY_TRACT | Status: AC
  Administered 2021-12-23: 2 via RESPIRATORY_TRACT

## 2021-12-23 MED ORDER — ONDANSETRON 4 MG PO TBDP
4.0000 mg | ORAL_TABLET | Freq: Once | ORAL | Status: AC
  Administered 2021-12-23: 4 mg via ORAL

## 2021-12-23 NOTE — ED Notes (Addendum)
Pt called for vitals recheck. No response. Will recall soon.

## 2021-12-23 NOTE — ED Provider Notes (Signed)
Emergency Medicine Provider Triage Evaluation Note  Julie Coleman , a 31 y.o. female  was evaluated in triage.  Pt complains of pains in there stomach for two days and vomiting today.   She has vomited 4-5 times today and had 3-4 episodes of diarrhea.    She has been unable to tolerate PO today.    She reports that her chest started hurting two hours ago.   She also reports shortness of breath.  She was diagnosed with pneumonia 2 weeks ago, has been treated however has worsening productive cough. She has a history of asthma, has not tried an albuterol inhaler.  Review of Systems  Positive: See above Negative:   Physical Exam  Ht 5\' 7"  (1.702 m)    Wt 76 kg    BMI 26.24 kg/m  Gen:   Awake, no distress   Resp:  Normal effort, diffuse rhonchi bilaterally, frequent wet sounding cough. MSK:   Moves extremities without difficulty  Other:  RRR.   Medical Decision Making  Medically screening exam initiated at 2:27 AM.  Appropriate orders placed.  Gwynn Burly was informed that the remainder of the evaluation will be completed by another provider, this initial triage assessment does not replace that evaluation, and the importance of remaining in the ED until their evaluation is complete.  Note: Portions of this report may have been transcribed using voice recognition software. Every effort was made to ensure accuracy; however, inadvertent computerized transcription errors may be present     Ollen Gross 12/23/21 0236    Fatima Blank, MD 12/25/21 [phone]

## 2021-12-23 NOTE — ED Notes (Signed)
Pt called for vitals. Security stated she left ED.

## 2021-12-23 NOTE — ED Notes (Signed)
Pt called for vitals recheck. No response. Will recall soon.  °

## 2021-12-23 NOTE — ED Notes (Signed)
Pt called for vitals recheck. No response. °

## 2021-12-23 NOTE — ED Triage Notes (Signed)
Patient arrived with EMS from home reports pain across her chest this evening with SOB and productive cough/chest congestion . Diagnosed with pneumonia 2 weeks ago .

## 2022-01-06 ENCOUNTER — Ambulatory Visit: Payer: Medicaid Other | Admitting: Family Medicine

## 2022-01-16 ENCOUNTER — Other Ambulatory Visit: Payer: Self-pay | Admitting: Certified Nurse Midwife

## 2022-01-16 DIAGNOSIS — N6459 Other signs and symptoms in breast: Secondary | ICD-10-CM

## 2022-02-25 ENCOUNTER — Other Ambulatory Visit: Payer: Self-pay | Admitting: Certified Nurse Midwife

## 2022-02-25 DIAGNOSIS — N644 Mastodynia: Secondary | ICD-10-CM

## 2022-03-17 ENCOUNTER — Other Ambulatory Visit: Payer: Medicaid Other

## 2022-03-25 ENCOUNTER — Emergency Department (HOSPITAL_COMMUNITY)
Admission: EM | Admit: 2022-03-25 | Discharge: 2022-03-25 | Disposition: A | Discharge status: Home / Self Care | Payer: Medicaid Other | Attending: Emergency Medicine | Admitting: Emergency Medicine

## 2022-03-25 DIAGNOSIS — R319 Hematuria, unspecified: Secondary | ICD-10-CM | POA: Insufficient documentation

## 2022-03-25 DIAGNOSIS — R1030 Lower abdominal pain, unspecified: Secondary | ICD-10-CM | POA: Diagnosis not present

## 2022-03-25 DIAGNOSIS — Z5321 Procedure and treatment not carried out due to patient leaving prior to being seen by health care provider: Secondary | ICD-10-CM | POA: Diagnosis not present

## 2022-03-25 NOTE — ED Notes (Signed)
Pt stated that she is leaving per her Dr. Lenon Ahmadi that she can come in to his office to get on BP meds. ?

## 2022-03-25 NOTE — ED Triage Notes (Signed)
PER EMS: [facility] when she started having lower abdominal pain, about 1700. She also reports blood in her urine or possibly from her vagina, has IUD.  ?BP - 128/76, HR- 50, RR-17, O2-98% ?

## 2022-04-10 ENCOUNTER — Emergency Department (HOSPITAL_COMMUNITY): Payer: Medicaid Other

## 2022-04-10 ENCOUNTER — Other Ambulatory Visit: Payer: Self-pay

## 2022-04-10 ENCOUNTER — Encounter (HOSPITAL_COMMUNITY): Payer: Self-pay | Admitting: Emergency Medicine

## 2022-04-10 ENCOUNTER — Emergency Department (HOSPITAL_COMMUNITY)
Admission: EM | Admit: 2022-04-10 | Discharge: 2022-04-10 | Disposition: A | Discharge status: Home / Self Care | Payer: Medicaid Other | Attending: Emergency Medicine | Admitting: Emergency Medicine

## 2022-04-10 DIAGNOSIS — I10 Essential (primary) hypertension: Secondary | ICD-10-CM | POA: Diagnosis not present

## 2022-04-10 DIAGNOSIS — R1031 Right lower quadrant pain: Secondary | ICD-10-CM | POA: Insufficient documentation

## 2022-04-10 DIAGNOSIS — Z79899 Other long term (current) drug therapy: Secondary | ICD-10-CM | POA: Diagnosis not present

## 2022-04-10 DIAGNOSIS — R112 Nausea with vomiting, unspecified: Secondary | ICD-10-CM | POA: Diagnosis not present

## 2022-04-10 DIAGNOSIS — R944 Abnormal results of kidney function studies: Secondary | ICD-10-CM | POA: Insufficient documentation

## 2022-04-10 DIAGNOSIS — K0889 Other specified disorders of teeth and supporting structures: Secondary | ICD-10-CM | POA: Insufficient documentation

## 2022-04-10 DIAGNOSIS — J45909 Unspecified asthma, uncomplicated: Secondary | ICD-10-CM | POA: Diagnosis not present

## 2022-04-10 LAB — CBC
HCT: 39.3 % (ref 36.0–46.0)
Hemoglobin: 13 g/dL (ref 12.0–15.0)
MCH: 29.3 pg (ref 26.0–34.0)
MCHC: 33.1 g/dL (ref 30.0–36.0)
MCV: 88.7 fL (ref 80.0–100.0)
Platelets: 193 10*3/uL (ref 150–400)
RBC: 4.43 MIL/uL (ref 3.87–5.11)
RDW: 14 % (ref 11.5–15.5)
WBC: 7.2 10*3/uL (ref 4.0–10.5)
nRBC: 0 % (ref 0.0–0.2)

## 2022-04-10 LAB — COMPREHENSIVE METABOLIC PANEL
ALT: 14 U/L (ref 0–44)
AST: 17 U/L (ref 15–41)
Albumin: 3.8 g/dL (ref 3.5–5.0)
Alkaline Phosphatase: 37 U/L — ABNORMAL LOW (ref 38–126)
Anion gap: 6 (ref 5–15)
BUN: 10 mg/dL (ref 6–20)
CO2: 26 mmol/L (ref 22–32)
Calcium: 8.8 mg/dL — ABNORMAL LOW (ref 8.9–10.3)
Chloride: 105 mmol/L (ref 98–111)
Creatinine, Ser: 1.07 mg/dL — ABNORMAL HIGH (ref 0.44–1.00)
GFR, Estimated: >60 mL/min (ref >60)
Glucose, Bld: 88 mg/dL (ref 70–99)
Potassium: 3.8 mmol/L (ref 3.5–5.1)
Sodium: 137 mmol/L (ref 135–145)
Total Bilirubin: 0.6 mg/dL (ref 0.3–1.2)
Total Protein: 6.7 g/dL (ref 6.5–8.1)

## 2022-04-10 LAB — I-STAT BETA HCG BLOOD, ED (MC, WL, AP ONLY): I-stat hCG, quantitative: <5 m[IU]/mL (ref <5)

## 2022-04-10 LAB — LIPASE, BLOOD: Lipase: 28 U/L (ref 11–51)

## 2022-04-10 MED ORDER — SODIUM CHLORIDE 0.9 % IV BOLUS
1000.0000 mL | Freq: Once | INTRAVENOUS | Status: AC
  Administered 2022-04-10: 1000 mL via INTRAVENOUS

## 2022-04-10 MED ORDER — ONDANSETRON HCL 4 MG PO TABS: 4.0000 mg | ORAL_TABLET | Freq: Three times a day (TID) | ORAL | 0 refills | Status: AC | PRN

## 2022-04-10 MED ORDER — IOHEXOL 300 MG/ML  SOLN
100.0000 mL | Freq: Once | INTRAMUSCULAR | Status: AC | PRN
  Administered 2022-04-10: 100 mL via INTRAVENOUS

## 2022-04-10 MED ORDER — DICYCLOMINE HCL 10 MG/ML IM SOLN
20.0000 mg | Freq: Once | INTRAMUSCULAR | Status: AC
  Administered 2022-04-10: 20 mg via INTRAMUSCULAR
  Filled 2022-04-10: qty 2

## 2022-04-10 MED ORDER — ONDANSETRON HCL 4 MG/2ML IJ SOLN
4.0000 mg | Freq: Once | INTRAMUSCULAR | Status: AC
  Administered 2022-04-10: 4 mg via INTRAVENOUS
  Filled 2022-04-10: qty 2

## 2022-04-10 MED ORDER — AMOXICILLIN-POT CLAVULANATE 875-125 MG PO TABS: 1.0000 | ORAL_TABLET | Freq: Two times a day (BID) | ORAL | 0 refills | Status: AC

## 2022-04-10 MED ORDER — SODIUM CHLORIDE 0.9 % IV SOLN
3.0000 g | Freq: Once | INTRAVENOUS | Status: AC
  Administered 2022-04-10: 3 g via INTRAVENOUS
  Filled 2022-04-10: qty 8

## 2022-04-10 NOTE — ED Provider Triage Note (Signed)
Emergency Medicine Provider Triage Evaluation Note ? ?Julie Coleman , a 31 y.o. female  was evaluated in triage.  Pt complains of abdominal pain for 2 days, nausea and vomiting for 1 day.  Patient unable to tell me only time she has vomited since last night.  Patient denies any known sick contacts.  Patient denies any fevers, blood in stool, blood in urine, blood in vomit. ? ?Review of Systems  ?Positive: Lower abdominal pain, nausea, vomiting ?Negative: Fevers, chest pain, shortness of breath, blood in stool, blood in urine ? ?Physical Exam  ?BP (!) 154/94   Pulse (!) 54   Temp 98.4 ?F (36.9 ?C) (Oral)   Resp 15   LMP 04/08/2022   SpO2 100%  ?Gen:   Awake, no distress   ?Resp:  Normal effort  ?MSK:   Moves extremities without difficulty  ?Other:  Nonfocal generalized abdominal tenderness ? ?Medical Decision Making  ?Medically screening exam initiated at 8:18 AM.  Appropriate orders placed.  Julie Coleman was informed that the remainder of the evaluation will be completed by another provider, this initial triage assessment does not replace that evaluation, and the importance of remaining in the ED until their evaluation is complete. ? ? ?  ?Al Decant, PA-C ?04/10/22 0981 ? ?

## 2022-04-10 NOTE — ED Triage Notes (Signed)
Pt to triage via [facility].  Reports mid lower abd pain x 2 days with nausea and vomiting.  Denies diarrhea.  LMP 2 days ago.  Denies urinary complaints.  Also reports L lower dental pain since yesterday. ?

## 2022-04-10 NOTE — ED Notes (Signed)
Patient given a bus ticket per her request. ? ?

## 2022-04-10 NOTE — ED Provider Notes (Signed)
?[facility] EMERGENCY DEPARTMENT ?Provider Note ? ? ?CSN: [id number] ?Arrival date & time: 04/10/22  0813 ? ?  ? ?History ? ?Chief Complaint  ?Patient presents with  ? Abdominal Pain  ? ? ?Julie Coleman is a 31 y.o. female with medical history significant for asthma, hypertension.  Patient presents ED for evaluation of nausea and vomiting as well as dental pain.  Patient states that she has had mid to lower abdominal pain for the last 2 days with nausea and vomiting.  Patient denies any diarrhea.  Patient states that she is currently on her menstrual period that began 2 days ago.  Patient denies any urinary complaints.  Patient states that since the last 2 days the pain has been waxing and waning and described as "cramping".  Patient denies any OTC medications prior to arrival.  Patient denies any marijuana usage. Patient denies any fevers, diarrhea, blood in stool, blood in vomit, trouble swallowing, drooling, change in phonation. ? ? ?Abdominal Pain ?Associated symptoms: nausea and vomiting   ?Associated symptoms: no chills, no diarrhea, no dysuria and no fever   ? ?  ? ?Home Medications ?Prior to Admission medications   ?Medication Sig Start Date End Date Taking? Authorizing Provider  ?amoxicillin-clavulanate (AUGMENTIN) 875-125 MG tablet Take 1 tablet by mouth every 12 (twelve) hours. 04/10/22  Yes Al Decant, PA-C  ?ondansetron (ZOFRAN) 4 MG tablet Take 1 tablet (4 mg total) by mouth every 8 (eight) hours as needed for nausea or vomiting. 04/10/22  Yes Al Decant, PA-C  ?azithromycin (ZITHROMAX) 250 MG tablet Take 1 tablet (250 mg total) by mouth daily. Take first 2 tablets together, then 1 every day until finished. 11/24/21   Horton, Mayer Masker, MD  ?capsaicin (ZOSTRIX) 0.025 % cream Apply topically 2 (two) times daily. 11/16/21   Levin Erp, MD  ?oseltamivir (TAMIFLU) 75 MG capsule Take 1 capsule (75 mg total) by mouth every 12 (twelve) hours. 11/15/21   Mannie Stabile, PA-C  ?oseltamivir (TAMIFLU) 75 MG capsule Take 1 capsule (75 mg total) by mouth 2 (two) times daily. 11/16/21   Levin Erp, MD  ?potassium chloride SA (KLOR-CON M) 20 MEQ tablet Take 1 tablet (20 mEq total) by mouth 2 (two) times daily. 11/24/21   Horton, Mayer Masker, MD  ?scopolamine (TRANSDERM-SCOP) 1 MG/3DAYS Place 1 patch (1.5 mg total) onto the skin every 3 (three) days. 11/19/21   Levin Erp, MD  ?   ? ?Allergies    ?Phenergan [promethazine hcl]   ? ?Review of Systems   ?Review of Systems  ?Constitutional:  Negative for chills and fever.  ?HENT:  Negative for drooling, trouble swallowing and voice change.   ?Gastrointestinal:  Positive for abdominal pain, nausea and vomiting. Negative for diarrhea.  ?Genitourinary:  Negative for dysuria.  ?All other systems reviewed and are negative. ? ?Physical Exam ?Updated Vital Signs ?BP (!) 141/103 (BP Location: Right Arm)   Pulse (!) 46   Temp 97.7 ?F (36.5 ?C) (Oral)   Resp 15   LMP 04/08/2022   SpO2 100%  ?Physical Exam ?Vitals and nursing note reviewed.  ?Constitutional:   ?   General: She is not in acute distress. ?   Appearance: She is well-developed. She is not ill-appearing, toxic-appearing or diaphoretic.  ?HENT:  ?   Head: Normocephalic and atraumatic.  ?   Mouth/Throat:  ?   Mouth: Mucous membranes are moist.  ?   Pharynx: No oropharyngeal exudate.  ?Eyes:  ?  Extraocular Movements: Extraocular movements intact.  ?   Conjunctiva/sclera: Conjunctivae normal.  ?   Pupils: Pupils are equal, round, and reactive to light.  ?Cardiovascular:  ?   Rate and Rhythm: Normal rate and regular rhythm.  ?Pulmonary:  ?   Effort: Pulmonary effort is normal.  ?   Breath sounds: Normal breath sounds. No wheezing.  ?Abdominal:  ?   General: Abdomen is flat. Bowel sounds are normal.  ?   Palpations: Abdomen is soft.  ?   Tenderness: There is abdominal tenderness in the right lower quadrant.  ?Musculoskeletal:  ?   Cervical back: Normal range of motion  and neck supple. No tenderness.  ?Skin: ?   General: Skin is warm and dry.  ?   Capillary Refill: Capillary refill takes less than 2 seconds.  ?Neurological:  ?   Mental Status: She is alert and oriented to person, place, and time.  ? ? ?ED Results / Procedures / Treatments   ?Labs ?(all labs ordered are listed, but only abnormal results are displayed) ?Labs Reviewed  ?COMPREHENSIVE METABOLIC PANEL - Abnormal; Notable for the following components:  ?    Result Value  ? Creatinine, Ser 1.07 (*)   ? Calcium 8.8 (*)   ? Alkaline Phosphatase 37 (*)   ? All other components within normal limits  ?LIPASE, BLOOD  ?CBC  ?URINALYSIS, ROUTINE W REFLEX MICROSCOPIC  ?RAPID URINE DRUG SCREEN, HOSP PERFORMED  ?I-STAT BETA HCG BLOOD, ED ([facility], [facility], [facility] ONLY)  ? ? ?EKG ?EKG Interpretation ? ?Date/Time:  Saturday April 10 2022 08:10:12 EDT ?Ventricular Rate:  48 ?PR Interval:  144 ?QRS Duration: 86 ?QT Interval:  438 ?QTC Calculation: 391 ?R Axis:   85 ?Text Interpretation: Sinus bradycardia Nonspecific ST and T wave abnormality No significant change since last tracing Confirmed by Cathren LaineSteinl, Kevin ([id number]) on 04/10/2022 1:36:22 PM ? ?Radiology ?No results found. ? ?Procedures ?Procedures  ? ? ?Medications Ordered in ED ?Medications  ?sodium chloride 0.9 % bolus 1,000 mL (0 mLs Intravenous Stopped 04/10/22 1505)  ?ondansetron [facility](ZOFRAN) injection 4 mg (4 mg Intravenous Given 04/10/22 1133)  ?dicyclomine (BENTYL) injection 20 mg (20 mg Intramuscular Given 04/10/22 1134)  ?Ampicillin-Sulbactam (UNASYN) 3 g in sodium chloride 0.9 % 100 mL IVPB (0 g Intravenous Stopped 04/10/22 1343)  ?iohexol (OMNIPAQUE) 300 MG/ML solution 100 mL (100 mLs Intravenous Contrast Given 04/10/22 1227)  ? ? ?ED Course/ Medical Decision Making/ A&P ?  ?                        ?Medical Decision Making ?Amount and/or Complexity of Data Reviewed ?Labs: ordered. ?Radiology: ordered. ? ?Risk ?Prescription drug management. ? ? ?31 year old female presents ED for evaluation of  abdominal pain as well as dental pain.  Please see HPI for further details. ? ?On examination, the patient is afebrile, nontachycardic, nonhypoxic.  The patient lung sounds are clear bilaterally.  Patient abdomen is soft compressible all 4 quadrants however patient does elicit tenderness to her right lower quadrant.  Patient has obvious swelling bilaterally to both cheeks.  On inspection of the patient oral cavity, she has swollen gingival area bilaterally to upper row of teeth.  Patient has very poor dentition, multiple dental caries throughout.  No appreciable abscess that we will be able to drain here in the emergency department setting. ? ?Patient worked up utilizing the following labs and imaging studies interpreted by me personally: ?- CMP shows slightly elevated creatinine to 1.07 treated with  IV fluids ?- Beta-hCG negative ?- Lipase unremarkable ?- CBC unremarkable no elevated white blood cell count ?- Urine drug screen pending ?- Urinalysis pending ?- CT abdomen pelvis with contrast shows no signs of intra-abdominal abnormality, no appearance of appendicitis.  Patient does have appearance of malrotated IUD however she denies any pelvic pain ? ?Patient treated with 20 mg Bentyl, 4 mg Zofran.  Patient also treated with 3 g IV Unasyn for suspected dental abscesses.  The patient will be sent home with Zofran for any nausea as well as Augmentin for suspected dental infection.  Patient will be advised to follow-up with dentist/oral surgeon for further management. ? ?Most likely, cause of patient nausea and vomiting either viral gastroenteritis versus hyperemesis cannabinoid syndrome.  Patient denies marijuana usage however does have consistent positive marijuana urine drug screens.  I counseled the patient on this condition and I explained that excessive marijuana use could lead to the symptoms.  The patient voiced understanding. ? ?Patient given list of dentists to refer to for further management of suspected  oral abscesses.  Patient provided with return precautions and she voiced understanding.  The patient had all of her questions answered to her satisfaction.  At this time, I feel that this patient is stable for discharge

## 2022-04-10 NOTE — Discharge Instructions (Addendum)
Please return to the ED with any new symptoms such as fevers, inability to swallow, inability to tolerate oral secretions ?Please follow-up with an oral surgeon or dentist for further management of your suspected dental abscesses.  I provided a list of oral surgeons and dentist in the area for you to call ?Please pick up your antinausea medication.  Please also pick up your antibiotic.  Please be aware that 1 side effect of this antibiotic is diarrhea so consider picking up probiotic at the grocery store. ?

## 2022-04-28 ENCOUNTER — Other Ambulatory Visit: Payer: Medicaid Other
